# Patient Record
Sex: Female | Born: 1985 | Race: White | Hispanic: No | Marital: Married
Health system: Southern US, Community
[De-identification: ages and names within clinical notes are randomized; demographics above are authoritative.]

## PROBLEM LIST (undated history)

## (undated) DIAGNOSIS — J45909 Unspecified asthma, uncomplicated: Secondary | ICD-10-CM

## (undated) DIAGNOSIS — F410 Panic disorder [episodic paroxysmal anxiety] without agoraphobia: Secondary | ICD-10-CM

## (undated) DIAGNOSIS — F411 Generalized anxiety disorder: Secondary | ICD-10-CM

## (undated) DIAGNOSIS — T7840XA Allergy, unspecified, initial encounter: Secondary | ICD-10-CM

## (undated) DIAGNOSIS — L509 Urticaria, unspecified: Secondary | ICD-10-CM

## (undated) HISTORY — DX: Urticaria, unspecified: L50.9

## (undated) HISTORY — PX: WISDOM TOOTH EXTRACTION: SHX21

## (undated) HISTORY — DX: Generalized anxiety disorder: F41.1

## (undated) HISTORY — DX: Unspecified asthma, uncomplicated: J45.909

## (undated) HISTORY — PX: TONSILLECTOMY: SUR1361

## (undated) HISTORY — DX: Allergy, unspecified, initial encounter: T78.40XA

## (undated) HISTORY — DX: Panic disorder (episodic paroxysmal anxiety): F41.0

---
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
## 2006-01-15 ENCOUNTER — Ambulatory Visit: Payer: Self-pay | Admitting: Family Medicine

## 2006-01-29 ENCOUNTER — Ambulatory Visit: Payer: Self-pay | Admitting: Family Medicine

## 2006-01-29 ENCOUNTER — Encounter (INDEPENDENT_AMBULATORY_CARE_PROVIDER_SITE_OTHER): Payer: Self-pay | Admitting: Internal Medicine

## 2006-01-29 ENCOUNTER — Other Ambulatory Visit: Admission: RE | Admit: 2006-01-29 | Discharge: 2006-01-29 | Payer: Self-pay | Admitting: Family Medicine

## 2006-03-06 ENCOUNTER — Ambulatory Visit: Payer: Self-pay | Admitting: Family Medicine

## 2006-03-20 ENCOUNTER — Ambulatory Visit: Payer: Self-pay | Admitting: Internal Medicine

## 2006-09-03 ENCOUNTER — Ambulatory Visit: Payer: Self-pay | Admitting: Family Medicine

## 2006-12-22 ENCOUNTER — Telehealth (INDEPENDENT_AMBULATORY_CARE_PROVIDER_SITE_OTHER): Payer: Self-pay | Admitting: *Deleted

## 2007-01-15 ENCOUNTER — Encounter (INDEPENDENT_AMBULATORY_CARE_PROVIDER_SITE_OTHER): Payer: Self-pay | Admitting: Internal Medicine

## 2007-01-28 ENCOUNTER — Observation Stay (HOSPITAL_COMMUNITY): Admission: EM | Admit: 2007-01-28 | Discharge: 2007-01-29 | Payer: Self-pay | Admitting: Emergency Medicine

## 2007-02-05 ENCOUNTER — Other Ambulatory Visit: Admission: RE | Admit: 2007-02-05 | Discharge: 2007-02-05 | Payer: Self-pay | Admitting: Internal Medicine

## 2007-02-05 ENCOUNTER — Ambulatory Visit: Payer: Self-pay | Admitting: Internal Medicine

## 2007-02-05 ENCOUNTER — Encounter (INDEPENDENT_AMBULATORY_CARE_PROVIDER_SITE_OTHER): Payer: Self-pay | Admitting: Internal Medicine

## 2007-02-09 LAB — CONVERTED CEMR LAB
BUN: 7 mg/dL (ref 6–23)
Basophils Absolute: 0.1 10*3/uL (ref 0.0–0.1)
Chloride: 107 meq/L (ref 96–112)
Cholesterol: 156 mg/dL (ref 0–200)
Creatinine, Ser: 0.7 mg/dL (ref 0.4–1.2)
Eosinophils Absolute: 0.2 10*3/uL (ref 0.0–0.6)
GFR calc non Af Amer: 113 mL/min
HCT: 38.3 % (ref 36.0–46.0)
MCHC: 34.5 g/dL (ref 30.0–36.0)
MCV: 82.8 fL (ref 78.0–100.0)
Monocytes Absolute: 0.2 10*3/uL (ref 0.2–0.7)
Monocytes Relative: 2.3 % — ABNORMAL LOW (ref 3.0–11.0)
Neutrophils Relative %: 79.3 % — ABNORMAL HIGH (ref 43.0–77.0)
Potassium: 4 meq/L (ref 3.5–5.1)
RBC: 4.62 M/uL (ref 3.87–5.11)
RDW: 12.2 % (ref 11.5–14.6)
Sodium: 141 meq/L (ref 135–145)
TSH: 0.95 microintl units/mL (ref 0.35–5.50)

## 2007-02-18 ENCOUNTER — Encounter (INDEPENDENT_AMBULATORY_CARE_PROVIDER_SITE_OTHER): Payer: Self-pay | Admitting: *Deleted

## 2007-03-02 ENCOUNTER — Encounter (INDEPENDENT_AMBULATORY_CARE_PROVIDER_SITE_OTHER): Payer: Self-pay | Admitting: Internal Medicine

## 2007-03-11 ENCOUNTER — Ambulatory Visit: Payer: Self-pay | Admitting: Family Medicine

## 2007-03-11 DIAGNOSIS — F411 Generalized anxiety disorder: Secondary | ICD-10-CM

## 2007-03-11 DIAGNOSIS — F410 Panic disorder [episodic paroxysmal anxiety] without agoraphobia: Secondary | ICD-10-CM | POA: Insufficient documentation

## 2007-03-24 ENCOUNTER — Ambulatory Visit: Payer: Self-pay | Admitting: Professional

## 2007-03-31 ENCOUNTER — Ambulatory Visit: Payer: Self-pay | Admitting: Professional

## 2007-04-02 ENCOUNTER — Ambulatory Visit: Payer: Self-pay | Admitting: Family Medicine

## 2007-05-07 ENCOUNTER — Telehealth: Payer: Self-pay | Admitting: Family Medicine

## 2007-07-01 ENCOUNTER — Encounter (INDEPENDENT_AMBULATORY_CARE_PROVIDER_SITE_OTHER): Payer: Self-pay | Admitting: *Deleted

## 2007-08-03 ENCOUNTER — Ambulatory Visit: Payer: Self-pay | Admitting: Family Medicine

## 2007-08-18 ENCOUNTER — Ambulatory Visit: Payer: Self-pay | Admitting: Family Medicine

## 2007-08-18 DIAGNOSIS — J45909 Unspecified asthma, uncomplicated: Secondary | ICD-10-CM | POA: Insufficient documentation

## 2007-08-18 DIAGNOSIS — R062 Wheezing: Secondary | ICD-10-CM

## 2007-08-18 DIAGNOSIS — J069 Acute upper respiratory infection, unspecified: Secondary | ICD-10-CM | POA: Insufficient documentation

## 2007-08-21 ENCOUNTER — Telehealth (INDEPENDENT_AMBULATORY_CARE_PROVIDER_SITE_OTHER): Payer: Self-pay | Admitting: Internal Medicine

## 2007-08-21 ENCOUNTER — Ambulatory Visit: Payer: Self-pay | Admitting: Family Medicine

## 2007-08-21 ENCOUNTER — Encounter (INDEPENDENT_AMBULATORY_CARE_PROVIDER_SITE_OTHER): Payer: Self-pay | Admitting: Internal Medicine

## 2007-08-21 DIAGNOSIS — B999 Unspecified infectious disease: Secondary | ICD-10-CM | POA: Insufficient documentation

## 2007-08-21 DIAGNOSIS — B9789 Other viral agents as the cause of diseases classified elsewhere: Secondary | ICD-10-CM

## 2007-08-28 LAB — CONVERTED CEMR LAB
Basophils Absolute: 0 10*3/uL (ref 0.0–0.1)
Basophils Relative: 0 % (ref 0–1)
GGT: 96 units/L — ABNORMAL HIGH (ref 7–51)
Hemoglobin: 13.3 g/dL (ref 12.0–15.0)
Lymphocytes Relative: 17 % (ref 12–46)
MCHC: 32.7 g/dL (ref 30.0–36.0)
Mono Screen: NEGATIVE
Neutro Abs: 5.3 10*3/uL (ref 1.7–7.7)
Neutrophils Relative %: 73 % (ref 43–77)
Platelets: 269 10*3/uL (ref 150–400)
RDW: 13.5 % (ref 11.5–15.5)

## 2007-12-21 ENCOUNTER — Telehealth (INDEPENDENT_AMBULATORY_CARE_PROVIDER_SITE_OTHER): Payer: Self-pay | Admitting: Internal Medicine

## 2008-02-26 ENCOUNTER — Ambulatory Visit: Payer: Self-pay | Admitting: Family Medicine

## 2008-02-26 ENCOUNTER — Encounter (INDEPENDENT_AMBULATORY_CARE_PROVIDER_SITE_OTHER): Payer: Self-pay | Admitting: Internal Medicine

## 2008-02-26 ENCOUNTER — Other Ambulatory Visit: Admission: RE | Admit: 2008-02-26 | Discharge: 2008-02-26 | Payer: Self-pay | Admitting: Family Medicine

## 2008-02-26 LAB — CONVERTED CEMR LAB: Pap Smear: NORMAL

## 2008-03-02 ENCOUNTER — Encounter (INDEPENDENT_AMBULATORY_CARE_PROVIDER_SITE_OTHER): Payer: Self-pay | Admitting: Internal Medicine

## 2008-03-02 ENCOUNTER — Encounter (INDEPENDENT_AMBULATORY_CARE_PROVIDER_SITE_OTHER): Payer: Self-pay | Admitting: *Deleted

## 2008-04-11 ENCOUNTER — Telehealth: Payer: Self-pay | Admitting: Family Medicine

## 2008-05-17 ENCOUNTER — Telehealth (INDEPENDENT_AMBULATORY_CARE_PROVIDER_SITE_OTHER): Payer: Self-pay | Admitting: Internal Medicine

## 2008-09-09 ENCOUNTER — Telehealth (INDEPENDENT_AMBULATORY_CARE_PROVIDER_SITE_OTHER): Payer: Self-pay | Admitting: Internal Medicine

## 2008-11-18 ENCOUNTER — Encounter (INDEPENDENT_AMBULATORY_CARE_PROVIDER_SITE_OTHER): Payer: Self-pay | Admitting: Internal Medicine

## 2008-11-18 ENCOUNTER — Ambulatory Visit: Payer: Self-pay | Admitting: Family Medicine

## 2008-11-23 ENCOUNTER — Encounter (INDEPENDENT_AMBULATORY_CARE_PROVIDER_SITE_OTHER): Payer: Self-pay | Admitting: Internal Medicine

## 2008-11-23 LAB — CONVERTED CEMR LAB

## 2008-12-12 ENCOUNTER — Emergency Department (HOSPITAL_COMMUNITY): Admission: EM | Admit: 2008-12-12 | Discharge: 2008-12-12 | Payer: Self-pay | Admitting: Emergency Medicine

## 2008-12-21 ENCOUNTER — Ambulatory Visit: Payer: Self-pay | Admitting: Family Medicine

## 2008-12-21 ENCOUNTER — Telehealth (INDEPENDENT_AMBULATORY_CARE_PROVIDER_SITE_OTHER): Payer: Self-pay

## 2008-12-21 DIAGNOSIS — J3089 Other allergic rhinitis: Secondary | ICD-10-CM | POA: Insufficient documentation

## 2008-12-21 DIAGNOSIS — J309 Allergic rhinitis, unspecified: Secondary | ICD-10-CM

## 2008-12-30 ENCOUNTER — Telehealth (INDEPENDENT_AMBULATORY_CARE_PROVIDER_SITE_OTHER): Payer: Self-pay | Admitting: Internal Medicine

## 2009-02-08 ENCOUNTER — Ambulatory Visit: Payer: Self-pay | Admitting: Family Medicine

## 2009-02-08 ENCOUNTER — Encounter (INDEPENDENT_AMBULATORY_CARE_PROVIDER_SITE_OTHER): Payer: Self-pay | Admitting: Internal Medicine

## 2009-02-08 DIAGNOSIS — R55 Syncope and collapse: Secondary | ICD-10-CM

## 2009-02-08 LAB — CONVERTED CEMR LAB: Beta hcg, urine, semiquantitative: NEGATIVE

## 2009-02-13 LAB — CONVERTED CEMR LAB
BUN: 14 mg/dL (ref 6–23)
Basophils Absolute: 0 10*3/uL (ref 0.0–0.1)
Basophils Relative: 0.1 % (ref 0.0–3.0)
CO2: 28 meq/L (ref 19–32)
Calcium: 8.8 mg/dL (ref 8.4–10.5)
Chloride: 107 meq/L (ref 96–112)
Creatinine, Ser: 0.7 mg/dL (ref 0.4–1.2)
Eosinophils Absolute: 1.6 10*3/uL — ABNORMAL HIGH (ref 0.0–0.7)
Glucose, Bld: 87 mg/dL (ref 70–99)
MCHC: 34 g/dL (ref 30.0–36.0)
MCV: 83.2 fL (ref 78.0–100.0)
Monocytes Absolute: 0.6 10*3/uL (ref 0.1–1.0)
Neutrophils Relative %: 56.3 % (ref 43.0–77.0)
RBC: 4.73 M/uL (ref 3.87–5.11)
RDW: 12.5 % (ref 11.5–14.6)

## 2009-02-14 ENCOUNTER — Encounter: Payer: Self-pay | Admitting: Family Medicine

## 2009-02-14 ENCOUNTER — Ambulatory Visit: Payer: Self-pay

## 2009-02-22 ENCOUNTER — Encounter (INDEPENDENT_AMBULATORY_CARE_PROVIDER_SITE_OTHER): Payer: Self-pay | Admitting: Internal Medicine

## 2009-03-01 ENCOUNTER — Encounter (INDEPENDENT_AMBULATORY_CARE_PROVIDER_SITE_OTHER): Payer: Self-pay | Admitting: Internal Medicine

## 2009-03-03 ENCOUNTER — Encounter (INDEPENDENT_AMBULATORY_CARE_PROVIDER_SITE_OTHER): Payer: Self-pay | Admitting: Internal Medicine

## 2009-03-21 ENCOUNTER — Encounter (INDEPENDENT_AMBULATORY_CARE_PROVIDER_SITE_OTHER): Payer: Self-pay | Admitting: Internal Medicine

## 2009-03-29 ENCOUNTER — Other Ambulatory Visit: Admission: RE | Admit: 2009-03-29 | Discharge: 2009-03-29 | Payer: Self-pay | Admitting: Family Medicine

## 2009-03-29 ENCOUNTER — Encounter (INDEPENDENT_AMBULATORY_CARE_PROVIDER_SITE_OTHER): Payer: Self-pay | Admitting: Internal Medicine

## 2009-03-29 ENCOUNTER — Ambulatory Visit: Payer: Self-pay | Admitting: Family Medicine

## 2009-04-11 ENCOUNTER — Encounter: Payer: Self-pay | Admitting: Family Medicine

## 2009-04-14 ENCOUNTER — Telehealth (INDEPENDENT_AMBULATORY_CARE_PROVIDER_SITE_OTHER): Payer: Self-pay | Admitting: Internal Medicine

## 2009-04-21 ENCOUNTER — Encounter: Payer: Self-pay | Admitting: Family Medicine

## 2009-04-27 ENCOUNTER — Ambulatory Visit: Payer: Self-pay | Admitting: Family Medicine

## 2009-04-27 DIAGNOSIS — R87619 Unspecified abnormal cytological findings in specimens from cervix uteri: Secondary | ICD-10-CM

## 2009-05-15 ENCOUNTER — Telehealth: Payer: Self-pay | Admitting: Family Medicine

## 2009-05-16 ENCOUNTER — Telehealth (INDEPENDENT_AMBULATORY_CARE_PROVIDER_SITE_OTHER): Payer: Self-pay

## 2009-06-20 ENCOUNTER — Telehealth (INDEPENDENT_AMBULATORY_CARE_PROVIDER_SITE_OTHER): Payer: Self-pay | Admitting: Internal Medicine

## 2009-08-08 ENCOUNTER — Telehealth: Payer: Self-pay | Admitting: Family Medicine

## 2010-05-09 ENCOUNTER — Ambulatory Visit: Payer: Self-pay | Admitting: Family Medicine

## 2010-05-09 ENCOUNTER — Other Ambulatory Visit: Admission: RE | Admit: 2010-05-09 | Discharge: 2010-05-09 | Payer: Self-pay | Admitting: Family Medicine

## 2010-05-14 LAB — CONVERTED CEMR LAB: Pap Smear: NEGATIVE

## 2010-07-26 NOTE — Assessment & Plan Note (Signed)
Summary: CPX W/PAP SMEAR / LFW   Vital Signs:  Patient profile:   25 year old female Height:      62 inches Weight:      127 pounds BMI:     23.31 Temp:     98.7 degrees F oral Pulse rate:   72 / minute Pulse rhythm:   regular BP sitting:   120 / 70  (left arm) Cuff size:   regular  Vitals Entered By: Linde Gillis CMA Duncan Dull) (May 09, 2010 8:54 AM) CC: complete physicial with pap   History of Present Illness: 25 yo here for CPX with pap.  G0, on Yaz for OCPs.  With same sexual partner. Had high risk HPV pap last year, sent to gyn.  Colpo negative. Has received Gardasil series.  No complaints. Weaned herself of Paxil a couple of months ago and is fine.  Feels like anxiety is under control.  Current Medications (verified): 1)  Yaz 3-0.02 Mg Tabs (Drospirenone-Ethinyl Estradiol) .... Use As Directed 2)  Singulair 10 Mg Tabs (Montelukast Sodium) .Marland Kitchen.. 1 Once Daily For Allergic Rhinitis 3)  Proair Hfa 108 (90 Base) Mcg/act Aers (Albuterol Sulfate) .Marland Kitchen.. 1-2 Puffs Every 4 Hrs As Needed Wheezing or Chest Tightness 4)  Tylenol 325 Mg Tabs (Acetaminophen) .... Otc As Directed. 5)  Sleep Aide .... Otc As Directed. 6)  Nasonex 50 Mcg/act Susp (Mometasone Furoate) .... 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 Resp:  Denies cough, sputum productive, and wheezing. GU:  Denies discharge, dysuria, urinary frequency, and urinary hesitancy. Derm:  Denies rash. Neuro:  Denies headaches. Psych:  Denies anxiety and depression.  Physical Exam  General:  alert, well-developed, well-nourished, and well-hydrated.   Head:  normocephalic, atraumatic, and no abnormalities observed.   Eyes:  vision grossly intact, pupils equal, and pupils round.   Ears:  R ear normal and L ear normal.   Nose:  mucosal edema, boggy, .  no airflow obstruction  sinuses neg Mouth:  good dentition, pharynx pink and moist, and no exudates.   Neck:  no masses, no thyromegaly, no JVD, and no carotid bruits.   Breasts:  No mass, nodules, thickening, tenderness, bulging, retraction, inflamation, nipple discharge or skin changes noted.   Lungs:  normal respiratory effort, no intercostal retractions, no accessory muscle use, and normal breath sounds.   Heart:  normal rate, regular rhythm, and no murmur.   Abdomen:  soft, non-tender, normal bowel sounds, no distention, no masses, no rebound tenderness, no abdominal hernia, no inguinal hernia, no hepatomegaly, and no splenomegaly.   Rectal:  no external abnormalities.   Genitalia:  Pelvic Exam:        External: normal female genitalia without lesions or masses  Vagina: normal without lesions or masses        Cervix: normal without lesions or masses        Adnexa: normal bimanual exam without masses or fullness        Uterus: normal by palpation        Pap smear: performed Msk:  no joint swelling, no joint warmth, no redness over joints, and no joint deformities.   Extremities:  no edema Neurologic:  alert & oriented X3 and gait normal.   Skin:  turgor normal, color normal, and no rashes.    Psych:  normally interactive and not anxious appearing.     Impression & Recommendations:  Problem # 1:  WELL ADULT (ICD-V70.0) Reviewed preventive care protocols, scheduled due services, and updated immunizations Discussed nutrition, exercise, diet, and healthy lifestyle.  Also discussed sexual activity, pregnancy risk, and STD risk.  Encouraged to get regular exercise.  Pap today with GC/Chlamydia screening. Refused other STD testing.  Complete Medication List: 1)  Yaz 3-0.02 Mg Tabs (Drospirenone-ethinyl estradiol) .... Use as directed 2)  Singulair 10 Mg Tabs (Montelukast sodium) .Marland Kitchen.. 1 once daily for allergic rhinitis 3)  Proair Hfa 108 (90 Base) Mcg/act Aers (Albuterol sulfate) .Marland Kitchen.. 1-2 puffs every 4 hrs as needed wheezing or chest tightness 4)  Tylenol 325 Mg Tabs (Acetaminophen) .... Otc as directed. 5)  Sleep Aide  .... Otc as directed. 6)  Nasonex 50 Mcg/act Susp (Mometasone furoate) .... 2 sprays each nostri daily for congestion Prescriptions: YAZ 3-0.02 MG TABS (DROSPIRENONE-ETHINYL ESTRADIOL) use as directed  #1 x 11   Entered and Authorized by:   Ruthe Mannan MD   Signed by:   Ruthe Mannan MD on 05/09/2010   Method used:   Electronically to        [facility]* (retail)       [address]       [place], [place]  [id number]       Ph: [phone]       Fax: [phone]   RxID:   [id number]    Orders Added: 1)  Est. Patient 18-39 years [24401]    Current Allergies (reviewed today): No known allergies

## 2010-07-26 NOTE — Progress Notes (Signed)
Summary: Flovent HFA 110 mcg refill  Phone Note From Pharmacy Call back at [phone]   Caller: [facility] [address] Call For: Dr Ermalene Searing  Summary of Call: [facility] [address] refill request electonically for Flovent HFA to inhale 2 puffs twice daily as needed. #12gm. Not on med list. Please advise.  Initial call taken by: Lewanda Rife LPN,  August 08, 2009 10:03 AM  Follow-up for Phone Call        Please call pt to find out more of why this was stopped..I just see it removed from med list..no reason stated....has she restarted?  On this for asthma?.The patient is here for annual wellness exam and preventative care.    controlled or not? Follow-up by: Kerby Nora MD,  August 08, 2009 5:47 PM  Additional Follow-up for Phone Call Additional follow up Details #1::        uses rescue inhaler as needed   physical was in 2010 august   Additional Follow-up by: Benny Lennert CMA Duncan Dull),  August 09, 2009 8:55 AM    Additional Follow-up for Phone Call Additional follow up Details #2::    I don't understand..flovent HFA is controlled not rescue. So are you saying she does not need this refilled and she is only using albuterol rescue as needed?  Follow-up by: Kerby Nora MD,  August 09, 2009 2:04 PM  Additional Follow-up for Phone Call Additional follow up Details #3:: Details for Additional Follow-up Action Taken: yes  Additional Follow-up by: Benny Lennert CMA Duncan Dull),  August 09, 2009 2:14 PM

## 2011-01-10 ENCOUNTER — Encounter: Payer: Self-pay | Admitting: *Deleted

## 2011-03-29 ENCOUNTER — Other Ambulatory Visit: Payer: Self-pay | Admitting: *Deleted

## 2011-03-29 MED ORDER — DROSPIRENONE-ETHINYL ESTRADIOL 3-0.02 MG PO TABS: 1.0000 | ORAL_TABLET | ORAL | Status: DC

## 2011-04-08 LAB — POCT I-STAT CREATININE: Creatinine, Ser: 0.7

## 2011-04-08 LAB — DIFFERENTIAL
Eosinophils Absolute: 0.1
Eosinophils Relative: 1
Lymphocytes Relative: 14
Lymphs Abs: 2
Monocytes Relative: 5
Neutrophils Relative %: 78 — ABNORMAL HIGH

## 2011-04-08 LAB — CBC
HCT: 35.9 — ABNORMAL LOW
MCV: 80.6
RBC: 4.45
WBC: 14.7 — ABNORMAL HIGH

## 2011-04-08 LAB — I-STAT 8, (EC8 V) (CONVERTED LAB)
Acid-base deficit: 1
BUN: 8
Chloride: 103
HCT: 40
Potassium: 3.4 — ABNORMAL LOW
pH, Ven: 7.404 — ABNORMAL HIGH

## 2011-05-09 ENCOUNTER — Encounter: Payer: Self-pay | Admitting: Family Medicine

## 2011-05-13 ENCOUNTER — Ambulatory Visit (INDEPENDENT_AMBULATORY_CARE_PROVIDER_SITE_OTHER): Payer: BC Managed Care – PPO | Admitting: Family Medicine

## 2011-05-13 ENCOUNTER — Encounter: Payer: Self-pay | Admitting: Family Medicine

## 2011-05-13 ENCOUNTER — Other Ambulatory Visit (HOSPITAL_COMMUNITY)
Admission: RE | Admit: 2011-05-13 | Discharge: 2011-05-13 | Disposition: A | Discharge status: Home / Self Care | Payer: BC Managed Care – PPO | Source: Ambulatory Visit | Attending: Family Medicine | Admitting: Family Medicine

## 2011-05-13 ENCOUNTER — Encounter: Payer: Self-pay | Admitting: *Deleted

## 2011-05-13 VITALS — BP 110/80 | HR 77 | Temp 98.9°F | Ht 62.0 in | Wt 124.5 lb

## 2011-05-13 DIAGNOSIS — Z0000 Encounter for general adult medical examination without abnormal findings: Secondary | ICD-10-CM

## 2011-05-13 DIAGNOSIS — R87810 Cervical high risk human papillomavirus (HPV) DNA test positive: Secondary | ICD-10-CM | POA: Insufficient documentation

## 2011-05-13 DIAGNOSIS — Z113 Encounter for screening for infections with a predominantly sexual mode of transmission: Secondary | ICD-10-CM | POA: Insufficient documentation

## 2011-05-13 DIAGNOSIS — Z309 Encounter for contraceptive management, unspecified: Secondary | ICD-10-CM

## 2011-05-13 DIAGNOSIS — Z136 Encounter for screening for cardiovascular disorders: Secondary | ICD-10-CM

## 2011-05-13 DIAGNOSIS — Z01419 Encounter for gynecological examination (general) (routine) without abnormal findings: Secondary | ICD-10-CM | POA: Insufficient documentation

## 2011-05-13 DIAGNOSIS — F411 Generalized anxiety disorder: Secondary | ICD-10-CM

## 2011-05-13 DIAGNOSIS — R87619 Unspecified abnormal cytological findings in specimens from cervix uteri: Secondary | ICD-10-CM

## 2011-05-13 LAB — BASIC METABOLIC PANEL
CO2: 24 mEq/L (ref 19–32)
Calcium: 8.2 mg/dL — ABNORMAL LOW (ref 8.4–10.5)
GFR: 103.77 mL/min (ref >60.00)
Sodium: 137 mEq/L (ref 135–145)

## 2011-05-13 LAB — LIPID PANEL
HDL: 64 mg/dL (ref >39.00)
Triglycerides: 141 mg/dL (ref 0.0–149.0)

## 2011-05-13 MED ORDER — NORGESTIMATE-ETH ESTRADIOL 0.25-35 MG-MCG PO TABS: 1.0000 | ORAL_TABLET | Freq: Every day | ORAL | Status: DC

## 2011-05-13 NOTE — Patient Instructions (Signed)
Good to see you. Please call me from your pharmacy once you find out what they carry. Have a wonderful Thanksgiving.

## 2011-05-13 NOTE — Progress Notes (Signed)
25 yo here for CPX with pap.  G0, on Yaz for OCPs. With same sexual partner.  Wants to try new OCP, having some break through bleeding.  Had high risk HPV pap two years ago, sent to gyn. Colpo negative.  Has received Gardasil series.  No complaints.    Patient Active Problem List  Diagnoses  . VIRAL INFECTION  . ANXIETY DISORDER  . PANIC ATTACK  . URI  . ALLERGIC RHINITIS  . ASTHMA, ACUTE  . SYNCOPE  . WHEEZING  . PAP SMEAR, ABNORMAL  . Routine general medical examination at a health care facility   Past Medical History  Diagnosis Date  . Panic disorder without agoraphobia   . Anxiety state, unspecified    Past Surgical History  Procedure Date  . Tonsillectomy     01/26/07- Dr. Jenne Pane   History  Substance Use Topics  . Smoking status: Not on file  . Smokeless tobacco: Not on file  . Alcohol Use:    Family History  Problem Relation Age of Onset  . Hypertension Father   . Depression Mother   . Diabetes    . Cancer    . Stroke     Allergies not on file Current Outpatient Prescriptions on File Prior to Visit  Medication Sig Dispense Refill  . acetaminophen (TYLENOL) 325 MG tablet Take 325 mg by mouth as directed.        Marland Kitchen albuterol (PROAIR HFA) 108 (90 BASE) MCG/ACT inhaler Inhale 2 puffs into the lungs every 4 (four) hours as needed.        . drospirenone-ethinyl estradiol (YAZ,GIANVI,LORYNA) 3-0.02 MG tablet Take 1 tablet by mouth as directed.  1 Package  1  . mometasone (NASONEX) 50 MCG/ACT nasal spray Place 2 sprays into the nose daily.        . montelukast (SINGULAIR) 10 MG tablet Take 10 mg by mouth at bedtime.        . NON FORMULARY SLEEP AIDE OTC as directed        The PMH, PSH, Social History, Family History, Medications, and allergies have been reviewed in [facility], and have been updated if relevant.   Review of Systems  See HPI  General: Denies malaise.  Resp: Denies cough, sputum productive, and wheezing.  GU: Denies discharge, dysuria, urinary  frequency, and urinary hesitancy.  Derm: Denies rash.  Neuro: Denies headaches.  Psych: Denies anxiety and depression.   Physical Exam  BP 110/80  Pulse 77  Temp(Src) 98.9 F (37.2 C) (Oral)  Ht 5\' 2"  (1.575 m)  Wt 124 lb 8 oz (56.473 kg)  BMI 22.77 kg/m2  LMP 04/05/2011   General:  Well-developed,well-nourished,in no acute distress; alert,appropriate and cooperative throughout examination Head:  normocephalic and atraumatic.   Eyes:  vision grossly intact, pupils equal, pupils round, and pupils reactive to light.   Ears:  R ear normal and L ear normal.   Nose:  no external deformity.   Mouth:  good dentition.   Neck:  No deformities, masses, or tenderness noted. Breasts:  No mass, nodules, thickening, tenderness, bulging, retraction, inflamation, nipple discharge or skin changes noted.   Lungs:  Normal respiratory effort, chest expands symmetrically. Lungs are clear to auscultation, no crackles or wheezes. Heart:  Normal rate and regular rhythm. S1 and S2 normal without gallop, murmur, click, rub or other extra sounds. Abdomen:  Bowel sounds positive,abdomen soft and non-tender without masses, organomegaly or hernias noted. Rectal:  no external abnormalities.   Genitalia:  Pelvic Exam:  External: normal female genitalia without lesions or masses        Vagina: normal without lesions or masses        Cervix: normal without lesions or masses        Adnexa: normal bimanual exam without masses or fullness        Uterus: normal by palpation        Pap smear: performed Msk:  No deformity or scoliosis noted of thoracic or lumbar spine.   Extremities:  No clubbing, cyanosis, edema, or deformity noted with normal full range of motion of all joints.   Neurologic:  alert & oriented X3 and gait normal.   Skin:  Intact without suspicious lesions or rashes Cervical Nodes:  No lymphadenopathy noted Axillary Nodes:  No palpable lymphadenopathy Psych:  Cognition and judgment appear  intact. Alert and cooperative with normal attention span and concentration. No apparent delusions, illusions, hallucinations  Assessment and Plan: 1. Routine general medical examination at a health care facility   Reviewed preventive care protocols, scheduled due services, and updated immunizations Discussed nutrition, exercise, diet, and healthy lifestyle.  Cytology -Pap Smear, Basic Metabolic Panel (BMET), lipid panel  2. Contraception management   D/c yaz, will try spintec.

## 2011-07-18 ENCOUNTER — Other Ambulatory Visit: Payer: Self-pay | Admitting: *Deleted

## 2011-07-18 MED ORDER — MONTELUKAST SODIUM 10 MG PO TABS: 10.0000 mg | ORAL_TABLET | Freq: Every day | ORAL | Status: DC

## 2011-08-02 ENCOUNTER — Ambulatory Visit (INDEPENDENT_AMBULATORY_CARE_PROVIDER_SITE_OTHER)
Admission: RE | Admit: 2011-08-02 | Discharge: 2011-08-02 | Disposition: A | Discharge status: Home / Self Care | Payer: BC Managed Care – PPO | Source: Ambulatory Visit | Attending: Family Medicine | Admitting: Family Medicine

## 2011-08-02 ENCOUNTER — Ambulatory Visit: Payer: BC Managed Care – PPO | Admitting: Family Medicine

## 2011-08-02 ENCOUNTER — Ambulatory Visit (INDEPENDENT_AMBULATORY_CARE_PROVIDER_SITE_OTHER): Payer: BC Managed Care – PPO | Admitting: Family Medicine

## 2011-08-02 ENCOUNTER — Encounter: Payer: Self-pay | Admitting: Family Medicine

## 2011-08-02 VITALS — BP 120/70 | HR 68 | Temp 98.2°F | Wt 123.8 lb

## 2011-08-02 DIAGNOSIS — M25562 Pain in left knee: Secondary | ICD-10-CM

## 2011-08-02 DIAGNOSIS — M25569 Pain in unspecified knee: Secondary | ICD-10-CM

## 2011-08-02 NOTE — Patient Instructions (Signed)
Good to see you. You can take Alleve as directed on bottle and try doing these exercises after work. I will call you with your xray results. Have a great weekend.

## 2011-08-02 NOTE — Progress Notes (Signed)
SUBJECTIVE: Mariah [name] is a 26 y.o. female with 6 months of progressive knee pain. symptoms have been worsening since that time. Prior history of related problems: no prior problems with this area in the past.  Stands all day, feels pain is behind knee cap. Worsened by walking and going down stairs. No known injury, no swelling. Sometimes feels like her knee is popping. Knee has never "given out on her."  OBJECTIVE: BP 120/70  Pulse 68  Temp(Src) 98.2 F (36.8 C) (Oral)  Wt 123 lb 12 oz (56.133 kg)  Vital signs as noted above. Appearance: alert, well appearing, and in no distress. Knee exam: normal exam, no swelling, tenderness, instability; ligaments intact, FROM, negative drawer sign, negative McMurray sign. X-ray: ordered, but results not yet available.  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 Given sports medicine advisor with exercises to strengthen quad muscles. See pt instructions for details.

## 2011-08-14 ENCOUNTER — Other Ambulatory Visit: Payer: Self-pay

## 2011-08-14 MED ORDER — ALBUTEROL SULFATE HFA 108 (90 BASE) MCG/ACT IN AERS: 2.0000 | INHALATION_SPRAY | RESPIRATORY_TRACT | Status: DC | PRN

## 2011-08-14 NOTE — Telephone Encounter (Signed)
Received fax refill request from pharmacy.  Prescription refilled.

## 2011-12-16 ENCOUNTER — Emergency Department (HOSPITAL_BASED_OUTPATIENT_CLINIC_OR_DEPARTMENT_OTHER)
Admission: EM | Admit: 2011-12-16 | Discharge: 2011-12-16 | Disposition: A | Discharge status: Home / Self Care | Payer: BC Managed Care – PPO | Attending: Emergency Medicine | Admitting: Emergency Medicine

## 2011-12-16 ENCOUNTER — Encounter (HOSPITAL_BASED_OUTPATIENT_CLINIC_OR_DEPARTMENT_OTHER): Payer: Self-pay | Admitting: *Deleted

## 2011-12-16 DIAGNOSIS — F410 Panic disorder [episodic paroxysmal anxiety] without agoraphobia: Secondary | ICD-10-CM | POA: Insufficient documentation

## 2011-12-16 DIAGNOSIS — Z809 Family history of malignant neoplasm, unspecified: Secondary | ICD-10-CM | POA: Insufficient documentation

## 2011-12-16 DIAGNOSIS — R55 Syncope and collapse: Secondary | ICD-10-CM | POA: Insufficient documentation

## 2011-12-16 DIAGNOSIS — N390 Urinary tract infection, site not specified: Secondary | ICD-10-CM | POA: Insufficient documentation

## 2011-12-16 DIAGNOSIS — Z818 Family history of other mental and behavioral disorders: Secondary | ICD-10-CM | POA: Insufficient documentation

## 2011-12-16 DIAGNOSIS — Z833 Family history of diabetes mellitus: Secondary | ICD-10-CM | POA: Insufficient documentation

## 2011-12-16 DIAGNOSIS — R319 Hematuria, unspecified: Secondary | ICD-10-CM | POA: Insufficient documentation

## 2011-12-16 DIAGNOSIS — Z8249 Family history of ischemic heart disease and other diseases of the circulatory system: Secondary | ICD-10-CM | POA: Insufficient documentation

## 2011-12-16 DIAGNOSIS — Z885 Allergy status to narcotic agent status: Secondary | ICD-10-CM | POA: Insufficient documentation

## 2011-12-16 DIAGNOSIS — Z823 Family history of stroke: Secondary | ICD-10-CM | POA: Insufficient documentation

## 2011-12-16 DIAGNOSIS — Z87891 Personal history of nicotine dependence: Secondary | ICD-10-CM | POA: Insufficient documentation

## 2011-12-16 LAB — URINALYSIS, ROUTINE W REFLEX MICROSCOPIC
Glucose, UA: NEGATIVE mg/dL
Ketones, ur: 40 mg/dL — AB
pH: 6 (ref 5.0–8.0)

## 2011-12-16 LAB — URINE MICROSCOPIC-ADD ON

## 2011-12-16 MED ORDER — MORPHINE SULFATE 4 MG/ML IJ SOLN
4.0000 mg | Freq: Once | INTRAMUSCULAR | Status: AC
  Administered 2011-12-16: 4 mg via INTRAVENOUS
  Filled 2011-12-16: qty 1

## 2011-12-16 MED ORDER — PHENAZOPYRIDINE HCL 100 MG PO TABS
200.0000 mg | ORAL_TABLET | Freq: Once | ORAL | Status: AC
  Administered 2011-12-16: 200 mg via ORAL
  Filled 2011-12-16: qty 2

## 2011-12-16 MED ORDER — METOCLOPRAMIDE HCL 10 MG PO TABS: 10.0000 mg | ORAL_TABLET | Freq: Four times a day (QID) | ORAL | Status: DC | PRN

## 2011-12-16 MED ORDER — CEPHALEXIN 500 MG PO CAPS: 500.0000 mg | ORAL_CAPSULE | Freq: Four times a day (QID) | ORAL | Status: AC

## 2011-12-16 MED ORDER — TRAMADOL HCL 50 MG PO TABS: 50.0000 mg | ORAL_TABLET | Freq: Four times a day (QID) | ORAL | Status: AC | PRN

## 2011-12-16 MED ORDER — ONDANSETRON HCL 4 MG/2ML IJ SOLN
4.0000 mg | Freq: Once | INTRAMUSCULAR | Status: AC
  Administered 2011-12-16: 4 mg via INTRAVENOUS
  Filled 2011-12-16: qty 2

## 2011-12-16 MED ORDER — PHENAZOPYRIDINE HCL 200 MG PO TABS: 200.0000 mg | ORAL_TABLET | Freq: Three times a day (TID) | ORAL | Status: AC

## 2011-12-16 MED ORDER — DEXTROSE 5 % IV SOLN
1.0000 g | INTRAVENOUS | Status: DC
  Administered 2011-12-16: 1 g via INTRAVENOUS
  Filled 2011-12-16: qty 10

## 2011-12-16 MED ORDER — SODIUM CHLORIDE 0.9 % IV BOLUS (SEPSIS)
1000.0000 mL | Freq: Once | INTRAVENOUS | Status: AC
  Administered 2011-12-16: 1000 mL via INTRAVENOUS

## 2011-12-16 NOTE — ED Provider Notes (Signed)
History   This chart was scribed for Dione Booze, MD by Melba Coon. The patient was seen in room MH10/MH10 and the patient's care was started at 10:08PM.    CSN: [id number]  Arrival date & time 12/16/11  2123   First MD Initiated Contact with Patient 12/16/11 2207      Chief Complaint  Patient presents with  . Loss of Consciousness    (Consider location/radiation/quality/duration/timing/severity/associated sxs/prior treatment) HPI Mariah [name] is a 26 y.o. female who EMS presents to the Emergency Department complaining of constant, moderate to severe hematuria and dysuria with associated LOC with an onset this evening. Pt was at work when present s/s occurred. Pt describes the pain as razor blades. At work, she kept feeling like she had to urinate and having pelvic pain. When she went to check her tampon, it was non-bloody. Pt then began to feel nauseous with sweats and chills; soon after, pt exhibited feeling of LOC; she sat down then passed out. No prior Hx of kidney/urinary infections. Sitting and urination aggravate the pain. Pt feels better at time of exam. Pt also states that she had replacement of IUD today. No HA, fever, neck pain, sore throat, rash, back pain, CP, SOB, emesis, diarrhea, or extremity pain, edema, weakness, numbness, or tingling. Allergic to codeine and vicodin. No other pertinent medical symptoms. Non-smoker; occasional drinker.  Past Medical History  Diagnosis Date  . Panic disorder without agoraphobia   . Anxiety state, unspecified     Past Surgical History  Procedure Date  . Tonsillectomy     01/26/07- Dr. Jenne Pane    Family History  Problem Relation Age of Onset  . Hypertension Father   . Depression Mother   . Diabetes    . Cancer    . Stroke      History  Substance Use Topics  . Smoking status: Former Games developer  . Smokeless tobacco: Not on file  . Alcohol Use: Yes    OB History    Grav Para Term Preterm Abortions TAB SAB Ect Mult Living                 Review of Systems 10 Systems reviewed and all are negative for acute change except as noted in the HPI.   Allergies  Codeine and Vicodin  Home Medications   Current Outpatient Rx  Name Route Sig Dispense Refill  . ALBUTEROL SULFATE HFA 108 (90 BASE) MCG/ACT IN AERS Inhalation Inhale 1-2 puffs into the lungs every 6 (six) hours as needed. For wheezing or chest tightness    . FLUTICASONE PROPIONATE 50 MCG/ACT NA SUSP Nasal Place 1 spray into the nose daily.    . IBUPROFEN 200 MG PO TABS Oral Take 400 mg by mouth every 6 (six) hours as needed. For pain    . LEVONORGESTREL 20 MCG/24HR IU IUD Intrauterine 1 each by Intrauterine route once. Inserted May 2013    . MELATONIN 1 MG PO TABS Oral Take 1 tablet by mouth at bedtime as needed. For sleep    . MONTELUKAST SODIUM 10 MG PO TABS Oral Take 1 tablet (10 mg total) by mouth at bedtime. 30 tablet 11    BP 126/75  Pulse 94  Temp 97.6 F (36.4 C) (Oral)  Resp 16  SpO2 100%  LMP 11/15/2011  Physical Exam  Nursing note and vitals reviewed. Constitutional: She is oriented to person, place, and time. She appears well-developed and well-nourished. No distress.  HENT:  Head: Normocephalic and atraumatic.  Right Ear: External ear normal.  Left Ear: External ear normal.  Eyes: EOM are normal.  Neck: Normal range of motion. No tracheal deviation present.  Cardiovascular: Normal rate, regular rhythm and normal heart sounds.   No murmur heard. Pulmonary/Chest: Effort normal and breath sounds normal. No respiratory distress. She has no wheezes.  Abdominal: Soft. Bowel sounds are normal. There is tenderness (Mild suprapubic tenderness). There is no rebound and no guarding.  Musculoskeletal: Normal range of motion. She exhibits no edema and no tenderness.  Neurological: She is alert and oriented to person, place, and time.  Skin: Skin is warm and dry. No rash noted.  Psychiatric: She has a normal mood and affect. Her behavior is  normal.    ED Course  Procedures (including critical care time)   COORDINATION OF CARE:  10:10PM - EDMD reviews UA results which are c/w UTI. EDMD will order pyridium, Zofran, morphine, and Rocephin for the pt.   Results for orders placed during the hospital encounter of 12/16/11  URINALYSIS, ROUTINE W REFLEX MICROSCOPIC      Component Value Range   Color, Urine AMBER (*) YELLOW   APPearance TURBID (*) CLEAR   Specific Gravity, Urine 1.027  1.005 - 1.030   pH 6.0  5.0 - 8.0   Glucose, UA NEGATIVE  NEGATIVE mg/dL   Hgb urine dipstick LARGE (*) NEGATIVE   Bilirubin Urine SMALL (*) NEGATIVE   Ketones, ur 40 (*) NEGATIVE mg/dL   Protein, ur 161 (*) NEGATIVE mg/dL   Urobilinogen, UA 1.0  0.0 - 1.0 mg/dL   Nitrite POSITIVE (*) NEGATIVE   Leukocytes, UA LARGE (*) NEGATIVE  PREGNANCY, URINE      Component Value Range   Preg Test, Ur NEGATIVE  NEGATIVE  URINE MICROSCOPIC-ADD ON      Component Value Range   Squamous Epithelial / LPF FEW (*) RARE   WBC, UA TOO NUMEROUS TO COUNT  <3 WBC/hpf   RBC / HPF 11-20  <3 RBC/hpf   Bacteria, UA FEW (*) RARE      1. Urinary tract infection   2. Vasovagal syncope       MDM  Symptoms typical of UTI with probable hemorrhagic cystitis. Syncopal episode is clearly vasovagal and does not need any further investigation. Because of severity of symptoms, will start treatment with IV Ceftriaxone, and treat for seven days with Cephalexin.  I personally performed the services described in this documentation, which was scribed in my presence. The recorded information has been reviewed and considered.          Dione Booze, MD 12/17/11 [phone]

## 2011-12-16 NOTE — ED Notes (Signed)
Was at work became lightheaded and passed out. Prior to that event she had pain in her groin. Has had hematuria today. IV started by EMS and NS 200cc bolus given. Alert on arrival. States pain continues but she no longer feels lightheaded. Pale.

## 2011-12-16 NOTE — Discharge Instructions (Signed)
Urinary Tract Infection Infections of the urinary tract can start in several places. A bladder infection (cystitis), a kidney infection (pyelonephritis), and a prostate infection (prostatitis) are different types of urinary tract infections (UTIs). They usually get better if treated with medicines (antibiotics) that kill germs. Take all the medicine until it is gone. You or your child may feel better in a few days, but TAKE ALL MEDICINE or the infection may not respond and may become more difficult to treat. HOME CARE INSTRUCTIONS   Drink enough water and fluids to keep the urine clear or pale yellow. Cranberry juice is especially recommended, in addition to large amounts of water.   Avoid caffeine, tea, and carbonated beverages. They tend to irritate the bladder.   Alcohol may irritate the prostate.   Only take over-the-counter or prescription medicines for pain, discomfort, or fever as directed by your caregiver.  To prevent further infections:  Empty the bladder often. Avoid holding urine for long periods of time.   After a bowel movement, women should cleanse from front to back. Use each tissue only once.   Empty the bladder before and after sexual intercourse.  FINDING OUT THE RESULTS OF YOUR TEST Not all test results are available during your visit. If your or your child's test results are not back during the visit, make an appointment with your caregiver to find out the results. Do not assume everything is normal if you have not heard from your caregiver or the medical facility. It is important for you to follow up on all test results. SEEK MEDICAL CARE IF:   There is back pain.   Your baby is older than 3 months with a rectal temperature of 100.5 F (38.1 C) or higher for more than 1 day.   Your or your child's problems (symptoms) are no better in 3 days. Return sooner if you or your child is getting worse.  SEEK IMMEDIATE MEDICAL CARE IF:   There is severe back pain or lower  abdominal pain.   You or your child develops chills.   You have a fever.   Your baby is older than 3 months with a rectal temperature of 102 F (38.9 C) or higher.   Your baby is 54 months old or younger with a rectal temperature of 100.4 F (38 C) or higher.   There is nausea or vomiting.   There is continued burning or discomfort with urination.  MAKE SURE YOU:   Understand these instructions.   Will watch your condition.   Will get help right away if you are not doing well or get worse.  Document Released: 03/20/2005 Document Revised: 05/30/2011 Document Reviewed: 10/23/2006 [facility] Patient Information 2012 [place], [place].  Syncope You have had a fainting (syncopal) spell. A fainting episode is a sudden, short-lived loss of consciousness. It results in complete recovery. It occurs because there has been a temporary shortage of oxygen and/or sugar (glucose) to the brain. CAUSES   Blood pressure pills and other medications that may lower blood pressure below normal. Sudden changes in posture (sudden standing).   Over-medication. Take your medications as directed.   Standing too long. This can cause blood to pool in the legs.   Seizure disorders.   Low blood sugar (hypoglycemia) of diabetes. This more commonly causes coma.   Bearing down to go to the bathroom. This can cause your blood pressure to rise suddenly. Your body compensates by making the blood pressure too low when you stop bearing down.  Hardening of the arteries where the brain temporarily does not receive enough blood.   Irregular heart beat and circulatory problems.   Fear, emotional distress, injury, sight of blood, or illness.  Your caregiver will send you home if the syncope was from non-worrisome causes (benign). Depending on your age and health, you may stay to be monitored and observed. If you return home, have someone stay with you if your caregiver feels that is desirable. It is very important to  keep all follow-up referrals and appointments in order to properly manage this condition. This is a serious problem which can lead to serious illness and death if not carefully managed.  WARNING: Do not drive or operate machinery until your caregiver feels that it is safe for you to do so. SEEK IMMEDIATE MEDICAL CARE IF:   You have another fainting episode or faint while lying or sitting down. DO NOT DRIVE YOURSELF. Call 911 if no other help is available.   You have chest pain, are feeling sick to your stomach (nausea), vomiting or abdominal pain.   You have an irregular heartbeat or one that is very fast (pulse over 120 beats per minute).   You have a loss of feeling in some part of your body or lose movement in your arms or legs.   You have difficulty with speech, confusion, severe weakness, or visual problems.   You become sweaty and/or feel light headed.  Make sure you are rechecked as instructed. Document Released: 06/10/2005 Document Revised: 05/30/2011 Document Reviewed: 01/29/2007 [facility] Patient Information 2012 [place], [place].  Cephalexin tablets or capsules What is this medicine? CEPHALEXIN (sef a LEX in) is a cephalosporin antibiotic. It is used to treat certain kinds of bacterial infections It will not work for colds, flu, or other viral infections. This medicine may be used for other purposes; ask your health care provider or pharmacist if you have questions. What should I tell my health care provider before I take this medicine? They need to know if you have any of these conditions: -kidney disease -stomach or intestine problems, especially colitis -an unusual or allergic reaction to cephalexin, other cephalosporins, penicillins, other antibiotics, medicines, foods, dyes or preservatives -pregnant or trying to get pregnant -breast-feeding How should I use this medicine? Take this medicine by mouth with a full glass of water. Follow the directions on the prescription  label. This medicine can be taken with or without food. Take your medicine at regular intervals. Do not take your medicine more often than directed. Take all of your medicine as directed even if you think you are better. Do not skip doses or stop your medicine early. Talk to your pediatrician regarding the use of this medicine in children. While this drug may be prescribed for selected conditions, precautions do apply. Overdosage: If you think you have taken too much of this medicine contact a poison control center or emergency room at once. NOTE: This medicine is only for you. Do not share this medicine with others. What if I miss a dose? If you miss a dose, take it as soon as you can. If it is almost time for your next dose, take only that dose. Do not take double or extra doses. There should be at least 4 to 6 hours between doses. What may interact with this medicine? -probenecid -some other antibiotics This list may not describe all possible interactions. Give your health care provider a list of all the medicines, herbs, non-prescription drugs, or dietary supplements you use.  Also tell them if you smoke, drink alcohol, or use illegal drugs. Some items may interact with your medicine. What should I watch for while using this medicine? Tell your doctor or health care professional if your symptoms do not begin to improve in a few days. Do not treat diarrhea with over the counter products. Contact your doctor if you have diarrhea that lasts more than 2 days or if it is severe and watery. If you have diabetes, you may get a false-positive result for sugar in your urine. Check with your doctor or health care professional. What side effects may I notice from receiving this medicine? Side effects that you should report to your doctor or health care professional as soon as possible: -allergic reactions like skin rash, itching or hives, swelling of the face, lips, or tongue -breathing problems -pain or  trouble passing urine -redness, blistering, peeling or loosening of the skin, including inside the mouth -severe or watery diarrhea -unusually weak or tired -yellowing of the eyes, skin Side effects that usually do not require medical attention (report to your doctor or health care professional if they continue or are bothersome): -gas or heartburn -genital or anal irritation -headache -joint or muscle pain -nausea, vomiting This list may not describe all possible side effects. Call your doctor for medical advice about side effects. You may report side effects to FDA at 1-800-FDA-1088. Where should I keep my medicine? Keep out of the reach of children. Store at room temperature between 59 and 86 degrees F (15 and 30 degrees C). Throw away any unused medicine after the expiration date. NOTE: This sheet is a summary. It may not cover all possible information. If you have questions about this medicine, talk to your doctor, pharmacist, or health care provider.  2012, [employer]vier/[employer] Standard. (09/14/2007 5:09:13 PM)  Phenazopyridine tablets What is this medicine? PHENAZOPYRIDINE (fen az oh PEER i deen) is a pain reliever. It is used to stop the pain, burning, or discomfort caused by infection or irritation of the urinary tract. This medicine is not an antibiotic. It will not cure a urinary tract infection. This medicine may be used for other purposes; ask your health care provider or pharmacist if you have questions. What should I tell my health care provider before I take this medicine? They need to know if you have any of these conditions: -glucose-6-phosphate dehydrogenase (G6PD) deficiency -kidney disease -an unusual or allergic reaction to phenazopyridine, other medicines, foods, dyes, or preservatives -pregnant or trying to get pregnant -breast-feeding How should I use this medicine? Take this medicine by mouth with a glass of water. Follow the directions on the prescription label. Take  after meals. Take your doses at regular intervals. Do not take your medicine more often than directed. Do not skip doses or stop your medicine early even if you feel better. Do not stop taking except on your doctor's advice. Talk to your pediatrician regarding the use of this medicine in children. Special care may be needed. Overdosage: If you think you have taken too much of this medicine contact a poison control center or emergency room at once. NOTE: This medicine is only for you. Do not share this medicine with others. What if I miss a dose? If you miss a dose, take it as soon as you can. If it is almost time for your next dose, take only that dose. Do not take double or extra doses. What may interact with this medicine? Interactions are not expected. This list may  not describe all possible interactions. Give your health care provider a list of all the medicines, herbs, non-prescription drugs, or dietary supplements you use. Also tell them if you smoke, drink alcohol, or use illegal drugs. Some items may interact with your medicine. What should I watch for while using this medicine? Tell your doctor or health care professional if your symptoms do not improve or if they get worse. This medicine colors body fluids red. This effect is harmless and will go away after you are done taking the medicine. It will change urine to an dark orange or red color. The red color may stain clothing. Soft contact lenses may become permanently stained. It is best not to wear soft contact lenses while taking this medicine. If you are diabetic you may get a false positive result for sugar in your urine. Talk to your health care provider. What side effects may I notice from receiving this medicine? Side effects that you should report to your doctor or health care professional as soon as possible: -allergic reactions like skin rash, itching or hives, swelling of the face, lips, or tongue -blue or purple color of the  skin -difficulty breathing -fever -less urine -unusual bleeding, bruising -unusual tired, weak -vomiting -yellowing of the eyes or skin Side effects that usually do not require medical attention (report to your doctor or health care professional if they continue or are bothersome): -dark urine -headache -stomach upset This list may not describe all possible side effects. Call your doctor for medical advice about side effects. You may report side effects to FDA at 1-800-FDA-1088. Where should I keep my medicine? Keep out of the reach of children. Store at room temperature between 15 and 30 degrees C (59 and 86 degrees F). Protect from light and moisture. Throw away any unused medicine after the expiration date. NOTE: This sheet is a summary. It may not cover all possible information. If you have questions about this medicine, talk to your doctor, pharmacist, or health care provider.  2012, Elsevier/Gold Standard. (01/07/2008 11:04:07 AM)  Tramadol tablets What is this medicine? TRAMADOL (TRA ma dole) is a pain reliever. It is used to treat moderate to severe pain in adults. This medicine may be used for other purposes; ask your health care provider or pharmacist if you have questions. What should I tell my health care provider before I take this medicine? They need to know if you have any of these conditions: -brain tumor -depression -drug abuse or addiction -head injury -if you frequently drink alcohol containing drinks -kidney disease or trouble passing urine -liver disease -lung disease, asthma, or breathing problems -seizures or epilepsy -suicidal thoughts, plans, or attempt; a previous suicide attempt by you or a family member -an unusual or allergic reaction to tramadol, codeine, other medicines, foods, dyes, or preservatives -pregnant or trying to get pregnant -breast-feeding How should I use this medicine? Take this medicine by mouth with a full glass of water. Follow  the directions on the prescription label. If the medicine upsets your stomach, take it with food or milk. Do not take more medicine than you are told to take. Talk to your pediatrician regarding the use of this medicine in children. Special care may be needed. Overdosage: If you think you have taken too much of this medicine contact a poison control center or emergency room at once. NOTE: This medicine is only for you. Do not share this medicine with others. What if I miss a dose? If you miss a  dose, take it as soon as you can. If it is almost time for your next dose, take only that dose. Do not take double or extra doses. What may interact with this medicine? Do not take this medicine with any of the following medications: -MAOIs like Carbex, Eldepryl, Marplan, Nardil, and Parnate This medicine may also interact with the following medications: -alcohol or medicines that contain alcohol -antihistamines -benzodiazepines -bupropion -carbamazepine or oxcarbazepine -clozapine -cyclobenzaprine -digoxin -furazolidone -linezolid -medicines for depression, anxiety, or psychotic disturbances -medicines for migraine headache like almotriptan, eletriptan, frovatriptan, naratriptan, rizatriptan, sumatriptan, zolmitriptan -medicines for pain like pentazocine, buprenorphine, butorphanol, meperidine, nalbuphine, and propoxyphene -medicines for sleep -muscle relaxants -naltrexone -phenobarbital -phenothiazines like perphenazine, thioridazine, chlorpromazine, mesoridazine, fluphenazine, prochlorperazine, promazine, and trifluoperazine -procarbazine -warfarin This list may not describe all possible interactions. Give your health care provider a list of all the medicines, herbs, non-prescription drugs, or dietary supplements you use. Also tell them if you smoke, drink alcohol, or use illegal drugs. Some items may interact with your medicine. What should I watch for while using this medicine? Tell your  doctor or health care professional if your pain does not go away, if it gets worse, or if you have new or a different type of pain. You may develop tolerance to the medicine. Tolerance means that you will need a higher dose of the medicine for pain relief. Tolerance is normal and is expected if you take this medicine for a long time. Do not suddenly stop taking your medicine because you may develop a severe reaction. Your body becomes used to the medicine. This does NOT mean you are addicted. Addiction is a behavior related to getting and using a drug for a non-medical reason. If you have pain, you have a medical reason to take pain medicine. Your doctor will tell you how much medicine to take. If your doctor wants you to stop the medicine, the dose will be slowly lowered over time to avoid any side effects. You may get drowsy or dizzy. Do not drive, use machinery, or do anything that needs mental alertness until you know how this medicine affects you. Do not stand or sit up quickly, especially if you are an older patient. This reduces the risk of dizzy or fainting spells. Alcohol can increase or decrease the effects of this medicine. Avoid alcoholic drinks. You may have constipation. Try to have a bowel movement at least every 2 to 3 days. If you do not have a bowel movement for 3 days, call your doctor or health care professional. Your mouth may get dry. Chewing sugarless gum or sucking hard candy, and drinking plenty of water may help. Contact your doctor if the problem does not go away or is severe. What side effects may I notice from receiving this medicine? Side effects that you should report to your doctor or health care professional as soon as possible: -allergic reactions like skin rash, itching or hives, swelling of the face, lips, or tongue -breathing difficulties, wheezing -confusion -itching -light headedness or fainting spells -redness, blistering, peeling or loosening of the skin,  including inside the mouth -seizures Side effects that usually do not require medical attention (report to your doctor or health care professional if they continue or are bothersome): -constipation -dizziness -drowsiness -headache -nausea, vomiting This list may not describe all possible side effects. Call your doctor for medical advice about side effects. You may report side effects to FDA at 1-800-FDA-1088. Where should I keep my medicine? Keep out of the  reach of children. Store at room temperature between 15 and 30 degrees C (59 and 86 degrees F). Keep container tightly closed. Throw away any unused medicine after the expiration date. NOTE: This sheet is a summary. It may not cover all possible information. If you have questions about this medicine, talk to your doctor, pharmacist, or health care provider.  2012, Elsevier/Gold Standard. (02/21/2010 11:55:44 AM)

## 2011-12-17 ENCOUNTER — Telehealth: Payer: Self-pay | Admitting: Family Medicine

## 2011-12-17 NOTE — Telephone Encounter (Signed)
Caller: Reid/Mother is calling with a question about Reglan, Keflex, Pyridium.  Was seen in the ED last [facility], [facility].  Was told that she has a UTI and given these medications for same.  Wants to make sure that they are appropriate? To continue with the medications.  Denies any possibility of pregnancy.  Had a GYN appt. on 6/24 as well.

## 2011-12-18 LAB — URINE CULTURE

## 2011-12-19 NOTE — ED Notes (Signed)
+   Urine Patient treated with Keflex-sensitive to same-chart appended per protocol MD. 

## 2011-12-30 ENCOUNTER — Ambulatory Visit (INDEPENDENT_AMBULATORY_CARE_PROVIDER_SITE_OTHER): Payer: BC Managed Care – PPO | Admitting: Family Medicine

## 2011-12-30 ENCOUNTER — Encounter: Payer: Self-pay | Admitting: Family Medicine

## 2011-12-30 VITALS — BP 102/70 | HR 76 | Temp 98.3°F | Wt 126.0 lb

## 2011-12-30 DIAGNOSIS — N390 Urinary tract infection, site not specified: Secondary | ICD-10-CM | POA: Insufficient documentation

## 2011-12-30 LAB — POCT URINALYSIS DIPSTICK
Glucose, UA: NEGATIVE
Ketones, UA: NEGATIVE
Spec Grav, UA: 1.015
Urobilinogen, UA: NEGATIVE

## 2011-12-30 MED ORDER — CIPROFLOXACIN HCL 500 MG PO TABS: 500.0000 mg | ORAL_TABLET | Freq: Two times a day (BID) | ORAL | Status: AC

## 2011-12-30 NOTE — Progress Notes (Signed)
SUBJECTIVE: Mariah [name] is a 26 y.o. female who complains of urinary frequency, urgency and dysuria x 2 days, without flank pain, fever, chills, or abnormal vaginal discharge or bleeding.   Patient Active Problem List  Diagnosis  . VIRAL INFECTION  . ANXIETY DISORDER  . PANIC ATTACK  . URI  . ALLERGIC RHINITIS  . ASTHMA, ACUTE  . SYNCOPE  . WHEEZING  . PAP SMEAR, ABNORMAL  . Routine general medical examination at a health care facility  . Contraception management  . Left knee pain  . UTI (lower urinary tract infection)   Past Medical History  Diagnosis Date  . Panic disorder without agoraphobia   . Anxiety state, unspecified    Past Surgical History  Procedure Date  . Tonsillectomy     01/26/07- Dr. Jenne Pane   History  Substance Use Topics  . Smoking status: Former Games developer  . Smokeless tobacco: Not on file  . Alcohol Use: Yes   Family History  Problem Relation Age of Onset  . Hypertension Father   . Depression Mother   . Diabetes    . Cancer    . Stroke     Allergies  Allergen Reactions  . Codeine Nausea Only and Other (See Comments)    Passes out  . Vicodin (Hydrocodone-Acetaminophen) Nausea Only and Other (See Comments)    Passes out   Current Outpatient Prescriptions on File Prior to Visit  Medication Sig Dispense Refill  . albuterol (PROVENTIL HFA;VENTOLIN HFA) 108 (90 BASE) MCG/ACT inhaler Inhale 1-2 puffs into the lungs every 6 (six) hours as needed. For wheezing or chest tightness      . fluticasone (FLONASE) 50 MCG/ACT nasal spray Place 1 spray into the nose daily.      Marland Kitchen ibuprofen (ADVIL,MOTRIN) 200 MG tablet Take 400 mg by mouth every 6 (six) hours as needed. For pain      . levonorgestrel (MIRENA) 20 MCG/24HR IUD 1 each by Intrauterine route once. Inserted May 2013      . Melatonin 1 MG TABS Take 1 tablet by mouth at bedtime as needed. For sleep      . metoCLOPramide (REGLAN) 10 MG tablet Take 1 tablet (10 mg total) by mouth every 6 (six) hours as  needed (nausea).  30 tablet  0  . montelukast (SINGULAIR) 10 MG tablet Take 1 tablet (10 mg total) by mouth at bedtime.  30 tablet  11   The PMH, PSH, Social History, Family History, Medications, and allergies have been reviewed in [facility], and have been updated if relevant.  OBJECTIVE:  BP 102/70  Pulse 76  Temp 98.3 F (36.8 C)  Wt 126 lb (57.153 kg)  LMP 11/15/2011  Appears well, in no apparent distress.  Vital signs are normal. The abdomen is soft without tenderness, guarding, mass, rebound or organomegaly. No CVA tenderness or inguinal adenopathy noted. Urine dipstick shows positive for RBC's.    ASSESSMENT: UTI uncomplicated without evidence of pyelonephritis  PLAN: Treatment per orders - cipro 500 mg twice daily x 3 days, urine cx, also push fluids, may use Pyridium OTC prn. Call or return to clinic prn if these symptoms worsen or fail to improve as anticipated.

## 2011-12-30 NOTE — Patient Instructions (Addendum)
Good to see you. We will call you with your culture results. Please take cipro 500 mg twice daily x 3 days. Drink plenty of fluids.

## 2011-12-31 LAB — URINE CULTURE
Colony Count: NO GROWTH
Organism ID, Bacteria: NO GROWTH

## 2012-07-27 ENCOUNTER — Other Ambulatory Visit: Payer: Self-pay | Admitting: *Deleted

## 2012-07-27 MED ORDER — MONTELUKAST SODIUM 10 MG PO TABS: 10.0000 mg | ORAL_TABLET | Freq: Every day | ORAL | Status: DC

## 2012-08-05 ENCOUNTER — Encounter: Payer: Self-pay | Admitting: Family Medicine

## 2012-08-05 ENCOUNTER — Ambulatory Visit (INDEPENDENT_AMBULATORY_CARE_PROVIDER_SITE_OTHER): Payer: BC Managed Care – PPO | Admitting: Family Medicine

## 2012-08-05 VITALS — BP 100/62 | HR 84 | Temp 98.8°F | Ht 62.0 in | Wt 127.8 lb

## 2012-08-05 DIAGNOSIS — J069 Acute upper respiratory infection, unspecified: Secondary | ICD-10-CM

## 2012-08-05 MED ORDER — AMOXICILLIN 500 MG PO CAPS: 1000.0000 mg | ORAL_CAPSULE | Freq: Two times a day (BID) | ORAL | Status: DC

## 2012-08-05 MED ORDER — FLUTICASONE PROPIONATE 50 MCG/ACT NA SUSP: 1.0000 | Freq: Every day | NASAL | Status: DC

## 2012-08-05 NOTE — Progress Notes (Signed)
Patient Name: Mariah [name] Date of Birth: 1985-12-16 Medical Record Number: [id number]  History of Present Illness:  Patent presents with runny nose, sneezing, cough, sore throat, malaise and minimal / low-grade fever .  2 days ago, started to sneeze a lot and was allergic to smoke and exposure. Now all in head and ears are hurting and hurting behind eyes. Took some sudafed all day yesterday.   ? recent exposure to others with similar symptoms.   The patent denies sore throat as the primary complaint. Denies sthortness of breath/wheezing, high fever, chest pain, rhinits for more than 14 days, significant myalgia, otalgia, facial pain, abdominal pain, changes in bowel or bladder.  PMH, PHS, Allergies, Problem List, Medications, Family History, and Social History have all been reviewed.  Patient Active Problem List  Diagnosis  . VIRAL INFECTION  . ANXIETY DISORDER  . PANIC ATTACK  . URI  . ALLERGIC RHINITIS  . ASTHMA, ACUTE  . SYNCOPE  . WHEEZING  . PAP SMEAR, ABNORMAL  . Routine general medical examination at a health care facility  . Contraception management  . Left knee pain  . UTI (lower urinary tract infection)    Past Medical History  Diagnosis Date  . Panic disorder without agoraphobia   . Anxiety state, unspecified     Past Surgical History  Procedure Laterality Date  . Tonsillectomy      01/26/07- Dr. Jenne Pane    History  Substance Use Topics  . Smoking status: Former Games developer  . Smokeless tobacco: Not on file  . Alcohol Use: Yes    Family History  Problem Relation Age of Onset  . Hypertension Father   . Depression Mother   . Diabetes    . Cancer    . Stroke      Allergies  Allergen Reactions  . Codeine Nausea Only and Other (See Comments)    Passes out  . Vicodin (Hydrocodone-Acetaminophen) Nausea Only and Other (See Comments)    Passes out    Current Outpatient Prescriptions on File Prior to Visit  Medication Sig Dispense Refill  . albuterol  (PROVENTIL HFA;VENTOLIN HFA) 108 (90 BASE) MCG/ACT inhaler Inhale 1-2 puffs into the lungs every 6 (six) hours as needed. For wheezing or chest tightness      . ibuprofen (ADVIL,MOTRIN) 200 MG tablet Take 400 mg by mouth every 6 (six) hours as needed. For pain      . levonorgestrel (MIRENA) 20 MCG/24HR IUD 1 each by Intrauterine route once. Inserted May 2013      . Melatonin 1 MG TABS Take 1 tablet by mouth at bedtime as needed. For sleep      . montelukast (SINGULAIR) 10 MG tablet Take 1 tablet (10 mg total) by mouth at bedtime.  30 tablet  3   No current facility-administered medications on file prior to visit.    Review of Systems: as above, eating and drinking - tolerating PO. Urinating normally. No excessive vomitting or diarrhea. O/w as above.  Physical Exam:  Filed Vitals:   08/05/12 1009  BP: 100/62  Pulse: 84  Temp: 98.8 F (37.1 C)  TempSrc: Oral  Height: 5\' 2"  (1.575 m)  Weight: 127 lb 12 oz (57.947 kg)  SpO2: 97%    GEN: WDWN, Non-toxic, Atraumatic, normocephalic. A and O x 3. HEENT: Oropharynx clear without exudate, MMM, no significant LAD, mild rhinnorhea Ears: TM clear, COL visualized with good landmarks CV: RRR, no m/g/r. Pulm: CTA B, no wheezes, rhonchi, or crackles, normal  respiratory effort. EXT: no c/c/e Psych: well oriented, neither depressed nor anxious in appearance  A/P: 1. URI. Supportive care reviewed with patient. See patient instruction section. Likely URI, with oncoming snow, will also give her some abx to hold  Meds ordered this encounter  Medications  . fluticasone (FLONASE) 50 MCG/ACT nasal spray    Sig: Place 1 spray into the nose daily.    Dispense:  16 g    Refill:  2  . amoxicillin (AMOXIL) 500 MG capsule    Sig: Take 2 capsules (1,000 mg total) by mouth 2 (two) times daily.    Dispense:  40 capsule    Refill:  0

## 2012-09-09 ENCOUNTER — Other Ambulatory Visit: Payer: Self-pay | Admitting: *Deleted

## 2012-09-09 MED ORDER — ALBUTEROL SULFATE HFA 108 (90 BASE) MCG/ACT IN AERS: 1.0000 | INHALATION_SPRAY | Freq: Four times a day (QID) | RESPIRATORY_TRACT | Status: DC | PRN

## 2012-09-10 ENCOUNTER — Other Ambulatory Visit: Payer: Self-pay | Admitting: *Deleted

## 2012-09-10 NOTE — Telephone Encounter (Signed)
Opened in error

## 2012-09-10 NOTE — Telephone Encounter (Signed)
Refill called to [facility], sent to wrong pharmacy  Yesterday.  Refill cancelled at [facility].

## 2012-09-21 ENCOUNTER — Ambulatory Visit (INDEPENDENT_AMBULATORY_CARE_PROVIDER_SITE_OTHER): Payer: BC Managed Care – PPO | Admitting: Family Medicine

## 2012-09-21 ENCOUNTER — Encounter: Payer: Self-pay | Admitting: Family Medicine

## 2012-09-21 VITALS — BP 109/70 | HR 75 | Temp 98.0°F | Wt 120.2 lb

## 2012-09-21 DIAGNOSIS — B09 Unspecified viral infection characterized by skin and mucous membrane lesions: Secondary | ICD-10-CM

## 2012-09-21 DIAGNOSIS — R21 Rash and other nonspecific skin eruption: Secondary | ICD-10-CM | POA: Insufficient documentation

## 2012-09-21 NOTE — Patient Instructions (Signed)
I think this is a viral exanthem or viral rash .   Treat with antihistamine like claritin and hydrocortisone cream as needed. May see fever or upper respiratory symptoms develop over next few days.  Viral Exanthems, Adult Many viral infections of the skin are called viral exanthems. Exanthem is another name for a rash or skin eruption. The most common viral exanthems include the following:  [place] measles or rubella.  Measles or rubeola.  Roseola.  Parvovirus B19 (Erythema infectiosum or Fifth disease).  Chickenpox or varicella. DIAGNOSIS  Sometimes, other problems may cause a rash that looks like a viral exanthem. Most often, your caregiver can determine whether you have a viral exanthem by looking at the rash. They usually have distinct patterns or appearance. Lab work may be done if the diagnosis is uncertain. Sometimes, a small tissue sample (biopsy) of the rash may need to be taken. TREATMENT  Immunizations have led to a decrease in the number of cases of measles, mumps, and rubella. Viral exanthems may require clinical treatment if a bacterial infection or other problems follow. The rash may be associated with:  Minor sore throat.  Aches and pains.  Runny nose.  Watery eyes.  Tiredness.  Some coughs.  Gastrointestinal infections causing nausea, vomiting, and diarrhea. Viral exanthems do not respond to antibiotic medicines, because they are not caused by bacteria. HOME CARE INSTRUCTIONS   Only take over-the-counter or prescription medicines for pain, discomfort, diarrhea, or fever as directed by your caregiver.  Drink enough water and fluids to keep your urine clear or pale yellow. SEEK MEDICAL CARE IF:  You develop swollen neck glands. This may feel like lumps or bumps in the neck.  You develop tenderness over your sinuses.  You are not feeling partly better after 3 days.  You develop muscle aches.  You are feeling more tired than you would expect.  You get a  persistent cough with mucus. SEEK IMMEDIATE MEDICAL CARE IF:   You have a fever.  You develop red eyes or eye pain.  You develop sores in your mouth and difficulty drinking or eating.  You develop a sore throat with pus and difficulty swallowing.  You develop neck pain or a stiff neck.  You develop a severe headache.  You develop vomiting that will not stop. Document Released: 08/31/2002 Document Revised: 09/02/2011 Document Reviewed: 08/28/2010 [facility] Patient Information 2013 [place], [place].

## 2012-09-21 NOTE — Assessment & Plan Note (Addendum)
Anticipate viral exanthem although no other sxs currently (early on in course) Less likely allergic reaction/hives. Treat with continued otc steroid cream, antihistamine and benadryl prn. Update if progressing or deteriorating or new sxs.

## 2012-09-21 NOTE — Progress Notes (Signed)
  Subjective:    Patient ID: Mariah [name], female    DOB: 25-Feb-1986, 27 y.o.   MRN: [id number]  HPI CC: rash  H/o keratosis pilaris.  Presents with 1d h/o papular rash on arms and hands.  Not pruritic.  Not blisters. Only on hands and arms. Spares trunk, face, legs and feet. [place], wonders if exposed to something - however, rash started prior to trip.. Benadryl oral and topical yesterday helped rash.  Also used hydrocortisone  No new lotions, detergents, soaps, shampoos, foods, medicines. No oral lesions.  No fevers/chills, joint pains or nausea. No sick contacts.  No preceding viral URI sxs. Recently treated for sinusitis with amoxicillin 1.5 month ago  Past Medical History  Diagnosis Date  . Panic disorder without agoraphobia   . Anxiety state, unspecified      Review of Systems Per HPI    Objective:   Physical Exam  Nursing note and vitals reviewed. Constitutional: She appears well-developed and well-nourished. No distress.  HENT:  Head: Normocephalic and atraumatic.  Mouth/Throat: Oropharynx is clear and moist. No oropharyngeal exudate.  No oral lesions  Eyes: Conjunctivae and EOM are normal. Pupils are equal, round, and reactive to light.  Neck: Normal range of motion. Neck supple.  Cardiovascular: Normal rate, regular rhythm, normal heart sounds and intact distal pulses.   No murmur heard. Pulmonary/Chest: Effort normal and breath sounds normal. No respiratory distress. She has no wheezes. She has no rales.  Musculoskeletal: She exhibits no edema.  Skin: Skin is warm and dry. Rash noted.  Blanching papular rash with ring of erythema on bilateral dorsal hands, forearms.  Spares rest of body.  One spot on right palmar index finger.       Assessment & Plan:

## 2012-09-23 ENCOUNTER — Ambulatory Visit (INDEPENDENT_AMBULATORY_CARE_PROVIDER_SITE_OTHER): Payer: BC Managed Care – PPO | Admitting: Family Medicine

## 2012-09-23 VITALS — BP 98/60 | HR 90 | Temp 98.7°F | Resp 18 | Ht 62.75 in | Wt 123.0 lb

## 2012-09-23 DIAGNOSIS — L500 Allergic urticaria: Secondary | ICD-10-CM

## 2012-09-23 MED ORDER — METHYLPREDNISOLONE ACETATE 80 MG/ML IJ SUSP
80.0000 mg | Freq: Once | INTRAMUSCULAR | Status: AC
  Administered 2012-09-23: 80 mg via INTRAMUSCULAR

## 2012-09-23 MED ORDER — PREDNISONE 20 MG PO TABS: ORAL_TABLET | ORAL | Status: DC

## 2012-09-23 NOTE — Progress Notes (Signed)
Subjective: Patient has been having a rash for several days on her arms and legs and face target-like dots this are scattered over her. She's tried some OTC antihistamine with mild relief only.  Objective: Erythematous areas up to about a centimeter in diameter scattered on arms some on her ankles and top of feet, some on face. None under her clothing and hypertrophic but there are some on the lower legs. She says she's only had long pants on. She works at a pharmacy. She has Mirena for contraception. She is not on any other prescription meds except for Singulair.  Assessment: Urticaria new  Plan: Depo-Medrol Prednisone taper Zantac and Zyrtec  Return if worse  This is an atypical appearing rash but it certainly seems urticarial IN A photo exposed area.

## 2012-09-23 NOTE — Patient Instructions (Signed)
Take his Zantac 150 mg twice daily  Take Zyrtec one daily  Take prednisone in tapered dose fashion as directed  Return if worse

## 2012-10-15 ENCOUNTER — Ambulatory Visit (INDEPENDENT_AMBULATORY_CARE_PROVIDER_SITE_OTHER): Payer: BC Managed Care – PPO | Admitting: Family Medicine

## 2012-10-15 ENCOUNTER — Telehealth: Payer: Self-pay

## 2012-10-15 VITALS — BP 132/74 | HR 93 | Temp 98.2°F | Resp 16 | Ht 61.5 in | Wt 122.2 lb

## 2012-10-15 DIAGNOSIS — L259 Unspecified contact dermatitis, unspecified cause: Secondary | ICD-10-CM

## 2012-10-15 DIAGNOSIS — J309 Allergic rhinitis, unspecified: Secondary | ICD-10-CM

## 2012-10-15 MED ORDER — METHYLPREDNISOLONE (PAK) 4 MG PO TABS: ORAL_TABLET | ORAL | Status: DC

## 2012-10-15 NOTE — Telephone Encounter (Signed)
PT SAW DR. HOPPER FOR HIVES AND WAS PRESCRIBED PREDNISONE.  SHE SAID SHE HAD A BAD REACTION TO PREDNISONE AND THAT THE HIVES ARE BACK AGAIN.  WANTS TO KNOW IF WE COULD PRESCRIBE SOMETHING DIFFERENT FOR THEM. [phone]

## 2012-10-15 NOTE — Telephone Encounter (Signed)
Returned patient call- She states she was disoriented, had a headache, nauseated from the prednisone. She states the rash is back in the same areas. She states she was doing yard work yesterday and the rash started today as soon as she woke up. Advised pt to come in to be evaluated. Pt understands.

## 2012-10-15 NOTE — Progress Notes (Signed)
[address]   [place], [place]  [address]   [phone]  Subjective:    Patient ID: Mariah [name], female    DOB: 09-06-1985, 27 y.o.   MRN: [id number]  HPI This 27 y.o. female presents for evaluation of recurrent rash.  S/p evaluation by Dr. Alwyn Ren on 09/25/12; s/p Steroid injection with prednisone taper. Upon stopping Prednisone, developed nausea, vomiting, felt horrible.  Very sensitive to medications in general.  Prescribed 20mg  tablets.  Does not want it to spread.  Was located inner legs due to sweating, constriction.  Rash completely resolved with steroid treatment.  Pulled weeds yesterday, recurrent rash today.  Rash now on both hands, face, ears, forearms.  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  Ears get hot.  No scratching.  Refilled Prednisone, took 30mg , Ranitidine, Zyrtec 10mg .     Review of Systems  Constitutional: Negative for fever, chills, diaphoresis and fatigue.  HENT: Negative for mouth sores and trouble swallowing.   Respiratory: Negative for shortness of breath.   Genitourinary: Negative for genital sores.  Skin: Positive for color change and rash. Negative for pallor and wound.    Past Medical History  Diagnosis Date  . Panic disorder without agoraphobia   . Anxiety state, unspecified   . Allergy   . Asthma     Past Surgical History  Procedure Laterality Date  . Tonsillectomy      01/26/07- Dr. Jenne Pane    Prior to Admission medications   Medication Sig Start Date End Date Taking? Authorizing Provider  albuterol (PROVENTIL HFA;VENTOLIN HFA) 108 (90 BASE) MCG/ACT inhaler Inhale 1-2 puffs into the lungs every 6 (six) hours as needed. For wheezing or chest tightness 09/09/12  Yes Dianne Dun, MD  diphenhydrAMINE (BENADRYL) 25 mg capsule Take 50 mg by mouth every 6 (six) hours as needed for itching.   Yes Historical Provider, MD  fluticasone (FLONASE) 50 MCG/ACT nasal spray Place 1 spray into the nose daily. 08/05/12  Yes Spencer Copland, MD    ibuprofen (ADVIL,MOTRIN) 200 MG tablet Take 400 mg by mouth every 6 (six) hours as needed. For pain   Yes Historical Provider, MD  levonorgestrel (MIRENA) 20 MCG/24HR IUD 1 each by Intrauterine route once. Inserted May 2013   Yes Historical Provider, MD  Melatonin 1 MG TABS Take 1 tablet by mouth at bedtime as needed. For sleep   Yes Historical Provider, MD  montelukast (SINGULAIR) 10 MG tablet Take 1 tablet (10 mg total) by mouth at bedtime. 07/27/12  Yes Dianne Dun, MD  methylPREDNIsolone (MEDROL DOSPACK) 4 MG tablet follow package directions 10/15/12   Ethelda Chick, MD  predniSONE (DELTASONE) 20 MG tablet Take 3 daily for 2 days, then 2 daily for 2 days, then one daily for 2 days. 09/23/12   Peyton Najjar, MD    Allergies  Allergen Reactions  . Codeine Nausea Only and Other (See Comments)    Passes out  . Vicodin (Hydrocodone-Acetaminophen) Nausea Only and Other (See Comments)    Passes out    History   Social History  . Marital Status: Single    Spouse Name: N/A    Number of Children: 0  . Years of Education: N/A   Occupational History  . Student   .      Pharmacy Tech   Social History Main Topics  . Smoking status: Former Games developer  . Smokeless tobacco: Not on file  . Alcohol Use: Yes  Comment: very rarely  . Drug Use: No  . Sexually Active: Not on file   Other Topics Concern  . Not on file   Social History Narrative   [employer]-majoring in biology. Wants to work in Oceanographer. Works 30 hours per week at [employer]          Family History  Problem Relation Age of Onset  . Hypertension Father   . Depression Mother   . Diabetes    . Cancer    . Stroke         Objective:   Physical Exam  Nursing note and vitals reviewed. Constitutional: She is oriented to person, place, and time. She appears well-developed and well-nourished. No distress.  HENT:  Head: Normocephalic and atraumatic.  Mouth/Throat: Oropharynx is clear and moist.  Eyes: Conjunctivae and EOM are  normal. Pupils are equal, round, and reactive to light.  Neck: Normal range of motion. Neck supple.  Lymphadenopathy:    She has no cervical adenopathy.  Neurological: She is alert and oriented to person, place, and time.  Skin: Rash noted. She is not diaphoretic. There is erythema.  Scattered slightly linear-annular rash B wrists and distal forearms.  No vesicles or pustules.  B external ears with erythematous flat rash.  Small scattered rash facial.   Psychiatric: She has a normal mood and affect. Her behavior is normal.        Assessment & Plan:  Contact dermatitis  ALLERGIC RHINITIS   1. Contact Dermatitis:  New/recurrent; onset 24 hours after working in yard.  Rx for Methylprednisolone dose pack provided due to intolerance to Prednisone.  Continue Zyrtec daily and Zantac bid for next two weeks.  RTC for acute worsening. 2.  Allergic Rhinitis: Stable; recommend taking Zyrtec daily during spring months due to skin sensitivity; appointment in upcoming month with allergist.    Meds ordered this encounter  Medications  . methylPREDNIsolone (MEDROL DOSPACK) 4 MG tablet    Sig: follow package directions    Dispense:  21 tablet    Refill:  0

## 2012-10-15 NOTE — Patient Instructions (Addendum)

## 2012-11-17 ENCOUNTER — Other Ambulatory Visit: Payer: Self-pay | Admitting: *Deleted

## 2012-11-17 MED ORDER — MONTELUKAST SODIUM 10 MG PO TABS: 10.0000 mg | ORAL_TABLET | Freq: Every day | ORAL | Status: DC

## 2013-06-22 ENCOUNTER — Encounter: Payer: Self-pay | Admitting: Internal Medicine

## 2013-06-22 ENCOUNTER — Ambulatory Visit (INDEPENDENT_AMBULATORY_CARE_PROVIDER_SITE_OTHER): Payer: BC Managed Care – PPO | Admitting: Internal Medicine

## 2013-06-22 VITALS — BP 100/60 | HR 101 | Temp 97.8°F | Wt 126.0 lb

## 2013-06-22 DIAGNOSIS — R509 Fever, unspecified: Secondary | ICD-10-CM

## 2013-06-22 DIAGNOSIS — J111 Influenza due to unidentified influenza virus with other respiratory manifestations: Secondary | ICD-10-CM | POA: Insufficient documentation

## 2013-06-22 LAB — POCT INFLUENZA A/B: Influenza B, POC: POSITIVE

## 2013-06-22 MED ORDER — OSELTAMIVIR PHOSPHATE 75 MG PO CAPS: 75.0000 mg | ORAL_CAPSULE | Freq: Two times a day (BID) | ORAL | Status: DC

## 2013-06-22 NOTE — Assessment & Plan Note (Signed)
Rapid test clearly positive Discussed supportive care Discussed pros and cons of tamiflu ---will try it

## 2013-06-22 NOTE — Progress Notes (Signed)
Pre-visit discussion using our clinic review tool. No additional management support is needed unless otherwise documented below in the visit note.  

## 2013-06-22 NOTE — Progress Notes (Signed)
   Subjective:    Patient ID: Mariah [name], female    DOB: [birth year], 27 y.o.   MRN: [id number]  HPI Has been sick for 2 days Temp spikes up to 102--better with aspirin Awakens with fever again Some chills, then hot and sweats Was exposed to the flu  Has chest tightness Hurts to swallow--can taste mucus Only occasional cough---but really hurts (upper sternum) Deep breaths cause wheeze and cough Not SOB  Has disoriented feeling and sluggish Slight sore thorat---did salt water gargles which helped Some headache--all over No ear pain Some body aches  Current Outpatient Prescriptions on File Prior to Visit  Medication Sig Dispense Refill  . albuterol (PROVENTIL HFA;VENTOLIN HFA) 108 (90 BASE) MCG/ACT inhaler Inhale 1-2 puffs into the lungs every 6 (six) hours as needed. For wheezing or chest tightness  1 Inhaler  0  . fluticasone (FLONASE) 50 MCG/ACT nasal spray Place 1 spray into the nose daily.  16 g  2  . ibuprofen (ADVIL,MOTRIN) 200 MG tablet Take 400 mg by mouth every 6 (six) hours as needed. For pain      . levonorgestrel (MIRENA) 20 MCG/24HR IUD 1 each by Intrauterine route once. Inserted May 2013      . montelukast (SINGULAIR) 10 MG tablet Take 1 tablet (10 mg total) by mouth at bedtime.  30 tablet  5   No current facility-administered medications on file prior to visit.    Allergies  Allergen Reactions  . Codeine Nausea Only and Other (See Comments)    Passes out  . Vicodin [Hydrocodone-Acetaminophen] Nausea Only and Other (See Comments)    Passes out    Past Medical History  Diagnosis Date  . Panic disorder without agoraphobia   . Anxiety state, unspecified   . Allergy   . Asthma     Past Surgical History  Procedure Laterality Date  . Tonsillectomy      01/26/07- Dr. Jenne Pane    Family History  Problem Relation Age of Onset  . Hypertension Father   . Depression Mother   . Diabetes    . Cancer    . Stroke       Review of Systems No new rash No  vomiting or diarrhea Able to eat okay     Objective:   Physical Exam  Constitutional: She appears well-developed and well-nourished. No distress.  HENT:  No sinus tenderness Mild nasal congestion Pharynx mild injection without exudate TMs normal  Neck: Normal range of motion. Neck supple. No thyromegaly present.  Bilateral non tender anterior cervical nodes  Pulmonary/Chest: Effort normal and breath sounds normal. No respiratory distress. She has no wheezes. She has no rales.  No dullness  Lymphadenopathy:    She has cervical adenopathy.  Skin: No rash noted.          Assessment & Plan:

## 2013-06-22 NOTE — Patient Instructions (Signed)

## 2014-08-22 ENCOUNTER — Ambulatory Visit (INDEPENDENT_AMBULATORY_CARE_PROVIDER_SITE_OTHER): Payer: BLUE CROSS/BLUE SHIELD | Admitting: Internal Medicine

## 2014-08-22 ENCOUNTER — Encounter: Payer: Self-pay | Admitting: Internal Medicine

## 2014-08-22 VITALS — BP 98/66 | HR 78 | Temp 98.5°F | Wt 118.0 lb

## 2014-08-22 DIAGNOSIS — R5383 Other fatigue: Secondary | ICD-10-CM

## 2014-08-22 DIAGNOSIS — L509 Urticaria, unspecified: Secondary | ICD-10-CM

## 2014-08-22 LAB — COMPREHENSIVE METABOLIC PANEL
ALT: 11 U/L (ref 0–35)
AST: 15 U/L (ref 0–37)
Albumin: 4.2 g/dL (ref 3.5–5.2)
Alkaline Phosphatase: 43 U/L (ref 39–117)
BILIRUBIN TOTAL: 0.4 mg/dL (ref 0.2–1.2)
BUN: 13 mg/dL (ref 6–23)
CALCIUM: 9.1 mg/dL (ref 8.4–10.5)
CHLORIDE: 104 meq/L (ref 96–112)
CO2: 28 meq/L (ref 19–32)
Creatinine, Ser: 0.83 mg/dL (ref 0.40–1.20)
GFR: 87.22 mL/min (ref >60.00)
Glucose, Bld: 94 mg/dL (ref 70–99)
Potassium: 4 mEq/L (ref 3.5–5.1)
SODIUM: 136 meq/L (ref 135–145)
Total Protein: 7.2 g/dL (ref 6.0–8.3)

## 2014-08-22 LAB — CBC WITH DIFFERENTIAL/PLATELET
BASOS ABS: 0 10*3/uL (ref 0.0–0.1)
Basophils Relative: 0.4 % (ref 0.0–3.0)
Eosinophils Absolute: 0.5 10*3/uL (ref 0.0–0.7)
Eosinophils Relative: 4.1 % (ref 0.0–5.0)
HCT: 40.4 % (ref 36.0–46.0)
Hemoglobin: 13.5 g/dL (ref 12.0–15.0)
LYMPHS PCT: 19.5 % (ref 12.0–46.0)
Lymphs Abs: 2.2 10*3/uL (ref 0.7–4.0)
MCHC: 33.5 g/dL (ref 30.0–36.0)
MCV: 81.1 fl (ref 78.0–100.0)
MONOS PCT: 5.5 % (ref 3.0–12.0)
Monocytes Absolute: 0.6 10*3/uL (ref 0.1–1.0)
NEUTROS ABS: 7.8 10*3/uL — AB (ref 1.4–7.7)
NEUTROS PCT: 70.5 % (ref 43.0–77.0)
PLATELETS: 302 10*3/uL (ref 150.0–400.0)
RBC: 4.98 Mil/uL (ref 3.87–5.11)
RDW: 13.4 % (ref 11.5–15.5)
WBC: 11.1 10*3/uL — ABNORMAL HIGH (ref 4.0–10.5)

## 2014-08-22 LAB — T4, FREE: FREE T4: 0.72 ng/dL (ref 0.60–1.60)

## 2014-08-22 LAB — TSH: TSH: 1.43 u[IU]/mL (ref 0.35–4.50)

## 2014-08-22 NOTE — Progress Notes (Signed)
Subjective:    Patient ID: Mariah [name], female    DOB: 07/16/1985, 29 y.o.   MRN: [id number]  HPI  Pt presents to the clinic today with c/o feeling sluggish. This started about 3 years ago. The sluggishness only occurs when she breaks out in hives. The hives have occurred intermittently over the last 3 years. She has been seeing Dr. Greenfield CallasSharma for allergy testing and is receiving scheduled allergy shots. He did do skin testing on her but did not test her blood. She also reports she was on Xolair at one point which did seem to help, but she reports she felt really bad while she was taking it and does not want to take it again. She denies any other associated symptoms. Antihistamines OTC do no make it better. She has tried steroid creams without relief. She can not think of anything that makes it worse.   Review of Systems      Past Medical History  Diagnosis Date  . Panic disorder without agoraphobia   . Anxiety state, unspecified   . Allergy   . Asthma     Current Outpatient Prescriptions  Medication Sig Dispense Refill  . albuterol (PROVENTIL HFA;VENTOLIN HFA) 108 (90 BASE) MCG/ACT inhaler Inhale 1-2 puffs into the lungs every 6 (six) hours as needed. For wheezing or chest tightness 1 Inhaler 0  . drospirenone-ethinyl estradiol (YAZ,GIANVI,LORYNA) 3-0.02 MG tablet Take 1 tablet by mouth daily.    . montelukast (SINGULAIR) 10 MG tablet Take 1 tablet (10 mg total) by mouth at bedtime. 30 tablet 5   No current facility-administered medications for this visit.    Allergies  Allergen Reactions  . Codeine Nausea Only and Other (See Comments)    Passes out  . Vicodin [Hydrocodone-Acetaminophen] Nausea Only and Other (See Comments)    Passes out    Family History  Problem Relation Age of Onset  . Hypertension Father   . Depression Mother   . Diabetes    . Cancer    . Stroke      History   Social History  . Marital Status: Single    Spouse Name: N/A  . Number of Children: 0   . Years of Education: N/A   Occupational History  . Pharmacy tech     [employer]   .          Social History Main Topics  . Smoking status: Former Games developermoker  . Smokeless tobacco: Never Used  . Alcohol Use: 0.0 oz/week    0 Standard drinks or equivalent per week     Comment: very rarely  . Drug Use: No  . Sexual Activity: Not on file   Other Topics Concern  . Not on file   Social History Narrative               Constitutional: Pt reports fatigue. Denies fever, malaise, headache or abrupt weight changes.  HEENT: Denies eye pain, eye redness, ear pain, ringing in the ears, wax buildup, runny nose, nasal congestion, bloody nose, or sore throat. Respiratory: Denies difficulty breathing, shortness of breath, cough or sputum production.   Cardiovascular: Denies chest pain, chest tightness, palpitations or swelling in the hands or feet.  Skin: Pt reports hives. Denies redness, or ulcercations.   No other specific complaints in a complete review of systems (except as listed in HPI above).  Objective:   Physical Exam   BP 98/66 mmHg  Pulse 78  Temp(Src) 98.5 F (36.9 C) (Oral)  Wt 118 lb (53.524 kg)  SpO2 98% Wt Readings from Last 3 Encounters:  08/22/14 118 lb (53.524 kg)  06/22/13 126 lb (57.153 kg)  10/15/12 122 lb 3.2 oz (55.43 kg)    General: Appears her stated age, well developed, well nourished in NAD. Skin: Warm, dry and intact. No rashes, lesions or ulcerations noted. Neck:  Neck supple, trachea midline. No masses, lumps or thyromegaly present.  Cardiovascular:  S1,S2 noted.  No murmur, rubs or gallops noted.  Pulmonary/Chest: Normal effort and positive vesicular breath sounds. No respiratory distress. No wheezes, rales or ronchi noted.  Neurological: Alert and oriented.   BMET    Component Value Date/Time   NA 137 05/13/2011 0931   K 3.7 05/13/2011 0931   CL 106 05/13/2011 0931   CO2 24 05/13/2011 0931   GLUCOSE 88 05/13/2011 0931   BUN 11 05/13/2011  0931   CREATININE 0.7 05/13/2011 0931   CALCIUM 8.2* 05/13/2011 0931   GFRNONAA 111.09 02/08/2009 1257   GFRAA 137 02/05/2007 1523    Lipid Panel     Component Value Date/Time   CHOL 158 05/13/2011 0931   TRIG 141.0 05/13/2011 0931   HDL 64.00 05/13/2011 0931   CHOLHDL 2 05/13/2011 0931   VLDL 28.2 05/13/2011 0931   LDLCALC 66 05/13/2011 0931    CBC    Component Value Date/Time   WBC 9.8 02/08/2009 1257   RBC 4.73 02/08/2009 1257   HGB 13.4 02/08/2009 1257   HCT 39.3 02/08/2009 1257   PLT 317.0 02/08/2009 1257   MCV 83.2 02/08/2009 1257   MCHC 34.0 02/08/2009 1257   RDW 12.5 02/08/2009 1257   LYMPHSABS 2.1 02/08/2009 1257   MONOABS 0.6 02/08/2009 1257   EOSABS 1.6* 02/08/2009 1257   BASOSABS 0.0 02/08/2009 1257    Hgb A1C No results found for: HGBA1C      Assessment & Plan:   Hives, fatigue:  Has been seeing a specialist but reports no answers have been found Will check CBC, CMET, TSH and Free T4 Continue Singulair for now  Advised her to follow up with PCP in 2 weeks

## 2014-08-22 NOTE — Patient Instructions (Signed)
Hives Hives are itchy, red, swollen areas of the skin. They can vary in size and location on your body. Hives can come and go for hours or several days (acute hives) or for several weeks (chronic hives). Hives do not spread from person to person (noncontagious). They may get worse with scratching, exercise, and emotional stress. CAUSES   Allergic reaction to food, additives, or drugs.  Infections, including the common cold.  Illness, such as vasculitis, lupus, or thyroid disease.  Exposure to sunlight, heat, or cold.  Exercise.  Stress.  Contact with chemicals. SYMPTOMS   Red or white swollen patches on the skin. The patches may change size, shape, and location quickly and repeatedly.  Itching.  Swelling of the hands, feet, and face. This may occur if hives develop deeper in the skin. DIAGNOSIS  Your caregiver can usually tell what is wrong by performing a physical exam. Skin or blood tests may also be done to determine the cause of your hives. In some cases, the cause cannot be determined. TREATMENT  Mild cases usually get better with medicines such as antihistamines. Severe cases may require an emergency epinephrine injection. If the cause of your hives is known, treatment includes avoiding that trigger.  HOME CARE INSTRUCTIONS   Avoid causes that trigger your hives.  Take antihistamines as directed by your caregiver to reduce the severity of your hives. Non-sedating or low-sedating antihistamines are usually recommended. Do not drive while taking an antihistamine.  Take any other medicines prescribed for itching as directed by your caregiver.  Wear loose-fitting clothing.  Keep all follow-up appointments as directed by your caregiver. SEEK MEDICAL CARE IF:   You have persistent or severe itching that is not relieved with medicine.  You have painful or swollen joints. SEEK IMMEDIATE MEDICAL CARE IF:   You have a fever.  Your tongue or lips are swollen.  You have  trouble breathing or swallowing.  You feel tightness in the throat or chest.  You have abdominal pain. These problems may be the first sign of a life-threatening allergic reaction. Call your local emergency services (911 in [place] MAKE SURE YOU:   Understand these instructions.  Will watch your condition.  Will get help right away if you are not doing well or get worse. Document Released: 06/10/2005 Document Revised: 06/15/2013 Document Reviewed: 09/03/2011 Exit[employer] Patient Information 2015 [employer]. This information is not intended to replace advice given to you by your health care provider. Make sure you discuss any questions you have with your health care provider.  

## 2014-08-22 NOTE — Progress Notes (Signed)
Pre visit review using our clinic review tool, if applicable. No additional management support is needed unless otherwise documented below in the visit note. 

## 2014-08-22 NOTE — Progress Notes (Signed)
   Subjective:    Patient ID: Mariah [name], female    DOB: 05/21/1986, 29 y.o.   MRN: [id number]  HPI Mariah [name] is a 29 year old female who presents today with chief complaint of hives and feeling sluggish.  She has had this issue on and off for 3 years.  She notices the hives first appear on her hands and then will spread to her arms and legs, never to her trunk.  When the hives appear, the patient begins to feel sluggish and "hungover".  She has seen an allergist in the past for this and allergy testing was preformed.  She was also prescribed steroid cream for the hives without relief.  She has also tried Xolair injections, these relieved the hives but she does not wish to continue the injections due to side effects.   She would like to get her thyroid function tested today.     Review of Systems  Constitutional: Positive for fatigue. Negative for fever and chills.  HENT: Negative.   Respiratory: Negative for cough, shortness of breath and wheezing.   Cardiovascular: Negative for chest pain, palpitations and leg swelling.  Endocrine: Negative for cold intolerance and heat intolerance.  Genitourinary: Negative.   Musculoskeletal: Negative for back pain, joint swelling and neck pain.  Skin: Positive for rash (intermittent). Negative for color change and pallor.   Past Medical History  Diagnosis Date  . Panic disorder without agoraphobia   . Anxiety state, unspecified   . Allergy   . Asthma    Family History  Problem Relation Age of Onset  . Hypertension Father   . Depression Mother   . Diabetes    . Cancer    . Stroke     Current Outpatient Prescriptions on File Prior to Visit  Medication Sig Dispense Refill  . albuterol (PROVENTIL HFA;VENTOLIN HFA) 108 (90 BASE) MCG/ACT inhaler Inhale 1-2 puffs into the lungs every 6 (six) hours as needed. For wheezing or chest tightness 1 Inhaler 0  . montelukast (SINGULAIR) 10 MG tablet Take 1 tablet (10 mg total) by mouth at bedtime. 30  tablet 5   No current facility-administered medications on file prior to visit.       Objective:   Physical Exam  Constitutional: She is oriented to person, place, and time. She appears well-developed and well-nourished.  HENT:  Head: Normocephalic and atraumatic.  Neck: Normal range of motion. Neck supple. No thyromegaly present.  Cardiovascular: Normal rate, regular rhythm and normal heart sounds.   Pulmonary/Chest: Effort normal and breath sounds normal.  Musculoskeletal: Normal range of motion.  Lymphadenopathy:    She has no cervical adenopathy.  Neurological: She is alert and oriented to person, place, and time.  Skin: Skin is warm and dry. No rash noted.  Psychiatric: She has a normal mood and affect.   BP 98/66 mmHg  Pulse 78  Temp(Src) 98.5 F (36.9 C) (Oral)  Wt 118 lb (53.524 kg)  SpO2 98%        Assessment & Plan:  Will check CBC with diff, Cmet, TSH, t3, and t4.  Advised patient to make a follow up appointment with Dr. Dayton MartesAron, her PCP.

## 2014-08-26 ENCOUNTER — Telehealth: Payer: Self-pay | Admitting: Family Medicine

## 2014-08-26 NOTE — Telephone Encounter (Signed)
Pt is requesting referral to Dr Cliffton AstersJohn [name] (internist). Pt is Dr Mariah [name]'s pt, but [facility] Please call pt at [phone] when completed. Thanks.

## 2014-08-26 NOTE — Telephone Encounter (Signed)
Pt returned your call. Please call back at [phone]

## 2014-08-26 NOTE — Telephone Encounter (Signed)
Pt returned your call [phone]

## 2014-08-26 NOTE — Telephone Encounter (Signed)
Tele# provided in previous message is non working. Lm on pts original provided number

## 2014-08-26 NOTE — Telephone Encounter (Signed)
Lm on pts vm requesting a call back 

## 2015-02-25 DIAGNOSIS — L508 Other urticaria: Secondary | ICD-10-CM

## 2015-02-25 DIAGNOSIS — J4520 Mild intermittent asthma, uncomplicated: Secondary | ICD-10-CM | POA: Insufficient documentation

## 2015-03-03 ENCOUNTER — Other Ambulatory Visit: Payer: Self-pay

## 2015-03-03 MED ORDER — OMALIZUMAB 150 MG ~~LOC~~ SOLR
300.0000 mg | SUBCUTANEOUS | Status: DC
  Administered 2015-03-28 – 2015-06-27 (×4): 300 mg via SUBCUTANEOUS

## 2015-03-23 ENCOUNTER — Other Ambulatory Visit: Payer: Self-pay | Admitting: Allergy and Immunology

## 2015-03-23 DIAGNOSIS — J4520 Mild intermittent asthma, uncomplicated: Secondary | ICD-10-CM

## 2015-03-23 MED ORDER — ALBUTEROL SULFATE HFA 108 (90 BASE) MCG/ACT IN AERS: 1.0000 | INHALATION_SPRAY | RESPIRATORY_TRACT | Status: DC | PRN

## 2015-03-27 NOTE — Progress Notes (Signed)
This encounter was created in error - please disregard.  This encounter was created in error - please disregard.

## 2015-03-28 ENCOUNTER — Ambulatory Visit (INDEPENDENT_AMBULATORY_CARE_PROVIDER_SITE_OTHER): Payer: BLUE CROSS/BLUE SHIELD | Admitting: *Deleted

## 2015-03-28 DIAGNOSIS — L508 Other urticaria: Secondary | ICD-10-CM

## 2015-03-28 DIAGNOSIS — L501 Idiopathic urticaria: Secondary | ICD-10-CM

## 2015-04-25 ENCOUNTER — Other Ambulatory Visit: Payer: Self-pay | Admitting: *Deleted

## 2015-04-25 ENCOUNTER — Ambulatory Visit (INDEPENDENT_AMBULATORY_CARE_PROVIDER_SITE_OTHER): Payer: BLUE CROSS/BLUE SHIELD | Admitting: *Deleted

## 2015-04-25 DIAGNOSIS — L501 Idiopathic urticaria: Secondary | ICD-10-CM

## 2015-04-25 DIAGNOSIS — L508 Other urticaria: Secondary | ICD-10-CM

## 2015-04-25 MED ORDER — MONTELUKAST SODIUM 10 MG PO TABS: 10.0000 mg | ORAL_TABLET | Freq: Every day | ORAL | Status: DC

## 2015-05-02 ENCOUNTER — Ambulatory Visit (INDEPENDENT_AMBULATORY_CARE_PROVIDER_SITE_OTHER): Payer: BLUE CROSS/BLUE SHIELD | Admitting: Nurse Practitioner

## 2015-05-02 ENCOUNTER — Encounter: Payer: Self-pay | Admitting: Nurse Practitioner

## 2015-05-02 VITALS — BP 130/78 | HR 88 | Temp 98.9°F | Resp 16 | Wt 119.0 lb

## 2015-05-02 DIAGNOSIS — Z7189 Other specified counseling: Secondary | ICD-10-CM | POA: Diagnosis not present

## 2015-05-02 DIAGNOSIS — L508 Other urticaria: Secondary | ICD-10-CM

## 2015-05-02 DIAGNOSIS — J4521 Mild intermittent asthma with (acute) exacerbation: Secondary | ICD-10-CM | POA: Diagnosis not present

## 2015-05-02 DIAGNOSIS — F411 Generalized anxiety disorder: Secondary | ICD-10-CM

## 2015-05-02 DIAGNOSIS — T753XXA Motion sickness, initial encounter: Secondary | ICD-10-CM

## 2015-05-02 DIAGNOSIS — Z7689 Persons encountering health services in other specified circumstances: Secondary | ICD-10-CM

## 2015-05-02 NOTE — Progress Notes (Signed)
Patient ID: Mariah [name], female    DOB: 03/16/1986  Age: 29 y.o. MRN: [id number]  CC: New Patient (Initial Visit)   HPI Mariah [name] presents for establish care today and CC of nausea medication for a cruise.   1) New pt info:   Immunizations- Unknown tdap, flu given at her work  Mammogram-  Pap- 2015, keeping OB/GYN, has Gianvi for OCP, wants to become pregnant in the next year or so.   Eye Exam- UTD   Dental Exam- UTD  2) Chronic Problems-  Urticaria- Idiopathic. Pt takes Xolair shot once a month.   Testing for HIV- negative 2 years ago  Asthma- Singular and Proair inhaler as needed   Fainting Spells- 3 years ago no known etiology   3) Acute Problems-  Cruise- leaving for a cruise next year. She would like to know what to try or if I can call in something when the time comes. She denies trying anything in the past and is interested in something oral vs. The patch. We discussed options, how to take, ect... She is willing to try dramamine, but may have scope patch on hand for back up.   History Lawson FiscalLori has a past medical history of Panic disorder without agoraphobia; Anxiety state, unspecified; Allergy; and Asthma.   She has past surgical history that includes Tonsillectomy.   Her family history includes Cancer in an other family member; Depression in her mother; Diabetes in an other family member; Hypertension in her father; Stroke in an other family member.She reports that she has never smoked. She has never used smokeless tobacco. She reports that she drinks alcohol. She reports that she does not use illicit drugs.  Outpatient Prescriptions Prior to Visit  Medication Sig Dispense Refill  . albuterol (PROVENTIL HFA;VENTOLIN HFA) 108 (90 BASE) MCG/ACT inhaler Inhale 1-2 puffs into the lungs every 4 (four) hours as needed. For wheezing or chest tightness 1 Inhaler 0  . drospirenone-ethinyl estradiol (YAZ,GIANVI,LORYNA) 3-0.02 MG tablet Take 1 tablet by mouth daily.    . fexofenadine  (ALLEGRA) 180 MG tablet Take 180 mg by mouth daily.    . montelukast (SINGULAIR) 10 MG tablet Take 1 tablet (10 mg total) by mouth at bedtime. 30 tablet 0   Facility-Administered Medications Prior to Visit  Medication Dose Route Frequency Provider Last Rate Last Dose  . omalizumab Geoffry Paradise(XOLAIR) injection 300 mg  300 mg Subcutaneous Q28 days Jessica PriestEric J Kozlow, MD   300 mg at 04/25/15 0945    ROS Review of Systems  Constitutional: Negative for fever, chills, diaphoresis and fatigue.  Respiratory: Negative for chest tightness, shortness of breath and wheezing.   Cardiovascular: Negative for chest pain, palpitations and leg swelling.  Gastrointestinal: Negative for nausea, vomiting and diarrhea.  Skin: Negative for rash.  Neurological: Negative for dizziness, weakness, numbness and headaches.  Psychiatric/Behavioral: The patient is not nervous/anxious.    Objective:  BP 130/78 mmHg  Pulse 88  Temp(Src) 98.9 F (37.2 C)  Resp 16  Wt 119 lb (53.978 kg)  SpO2 99%  Physical Exam  Constitutional: She is oriented to person, place, and time. She appears well-developed and well-nourished. No distress.  HENT:  Head: Normocephalic and atraumatic.  Right Ear: External ear normal.  Left Ear: External ear normal.  Cardiovascular: Normal rate, regular rhythm and normal heart sounds.  Exam reveals no gallop and no friction rub.   No murmur heard. Pulmonary/Chest: Effort normal and breath sounds normal. No respiratory distress. She has no wheezes. She has  no rales. She exhibits no tenderness.  Neurological: She is alert and oriented to person, place, and time. No cranial nerve deficit. She exhibits normal muscle tone. Coordination normal.  Skin: Skin is warm and dry. No rash noted. She is not diaphoretic.  Psychiatric: She has a normal mood and affect. Her behavior is normal. Judgment and thought content normal.   Assessment & Plan:   There are no diagnoses linked to this encounter. I am having Ms.  [name] maintain her drospirenone-ethinyl estradiol, fexofenadine, albuterol, and montelukast. We will continue to administer omalizumab.  No orders of the defined types were placed in this encounter.     Follow-up: Return in about 3 months (around 07/26/2015) for CPE with labs .

## 2015-05-02 NOTE — Progress Notes (Signed)
Pre visit review using our clinic review tool, if applicable. No additional management support is needed unless otherwise documented below in the visit note. 

## 2015-05-02 NOTE — Patient Instructions (Addendum)
Nice to meet you and Welcome to [facility] [place] [facility]!   Annual exam with labs in Feb. (fasting labs- nothing to eat or drink after midnight)

## 2015-05-04 DIAGNOSIS — T753XXA Motion sickness, initial encounter: Secondary | ICD-10-CM | POA: Insufficient documentation

## 2015-05-04 DIAGNOSIS — Z7689 Persons encountering health services in other specified circumstances: Secondary | ICD-10-CM | POA: Insufficient documentation

## 2015-05-04 NOTE — Assessment & Plan Note (Signed)
Pt going on cruise and concerned about motion sickness. Discussed options and usage of each. She is willing to try dramamine and/or scopolamine patch. I stated I would call in scope patch closer to time so she can have this and use dramamine as a back up.

## 2015-05-04 NOTE — Assessment & Plan Note (Signed)
Stable currently on singular and Proair HFA as needed. Will follow

## 2015-05-04 NOTE — Assessment & Plan Note (Signed)
Stable currently. No concerns today.

## 2015-05-04 NOTE — Assessment & Plan Note (Signed)
Discussed acute and chronic issues. Reviewed health maintenance measures, PFSHx, and immunizations. Obtain records from previous facility.   

## 2015-05-04 NOTE — Assessment & Plan Note (Signed)
Helped by Xolair injections once monthly.

## 2015-05-30 ENCOUNTER — Encounter: Payer: Self-pay | Admitting: Allergy and Immunology

## 2015-05-30 ENCOUNTER — Encounter (INDEPENDENT_AMBULATORY_CARE_PROVIDER_SITE_OTHER): Payer: Self-pay

## 2015-05-30 ENCOUNTER — Ambulatory Visit (INDEPENDENT_AMBULATORY_CARE_PROVIDER_SITE_OTHER): Payer: BLUE CROSS/BLUE SHIELD | Admitting: Allergy and Immunology

## 2015-05-30 ENCOUNTER — Ambulatory Visit (INDEPENDENT_AMBULATORY_CARE_PROVIDER_SITE_OTHER): Payer: BLUE CROSS/BLUE SHIELD

## 2015-05-30 VITALS — BP 110/78 | HR 68 | Resp 20 | Ht 60.43 in | Wt 118.6 lb

## 2015-05-30 DIAGNOSIS — J4540 Moderate persistent asthma, uncomplicated: Secondary | ICD-10-CM | POA: Diagnosis not present

## 2015-05-30 DIAGNOSIS — J4520 Mild intermittent asthma, uncomplicated: Secondary | ICD-10-CM | POA: Diagnosis not present

## 2015-05-30 DIAGNOSIS — L500 Allergic urticaria: Secondary | ICD-10-CM | POA: Diagnosis not present

## 2015-05-30 DIAGNOSIS — H1010 Acute atopic conjunctivitis, unspecified eye: Secondary | ICD-10-CM | POA: Diagnosis not present

## 2015-05-30 DIAGNOSIS — J309 Allergic rhinitis, unspecified: Secondary | ICD-10-CM

## 2015-05-30 NOTE — Progress Notes (Signed)
[name] [place]  Follow-up Note  Refering Provider: Carollee Leitzoss, Carrie M, NP Primary Provider: Carollee Leitzoss, Carrie M, NP  Subjective:   Mariah FabianLori M [name] is a 29 y.o. female who returns to the [name] in re-evaluation of the following:  HPI Comments:  Mariah [name] returns to this clinic on 05/30/2015 in reevaluation of her atopic disease. She is receiving 300 mg of Xolair every month to treat her chronic urticaria with very good control. Her asthma is been under excellent control and she has a minimal requirement for bronchodilator and has not required any systemic steroids to treat her asthma over the course of the past year. She can apparently exercise without too much problem other she does notice that she gets out of breath if she exerts helped any large degree. She does use a short acting bronchodilator prior to exercise. He's had very little problems with her nose while using an antihistamine and montelukast. She did obtain the flu vaccine this year.   Outpatient Encounter Prescriptions as of 05/30/2015  Medication Sig  . albuterol (PROAIR HFA) 108 (90 BASE) MCG/ACT inhaler Inhale two puffs every four to six hours as needed for cough or wheeze.  . drospirenone-ethinyl estradiol (YAZ,GIANVI,LORYNA) 3-0.02 MG tablet Take 1 tablet by mouth daily.  Marland Kitchen. EPINEPHrine (EPIPEN 2-PAK) 0.3 mg/0.3 mL IJ SOAJ injection Use as directed for life-threatening allergic reactions.  . fexofenadine (ALLEGRA) 180 MG tablet Take 180 mg by mouth daily.  . montelukast (SINGULAIR) 10 MG tablet Take 1 tablet (10 mg total) by mouth at bedtime.  . [DISCONTINUED] albuterol (PROVENTIL HFA;VENTOLIN HFA) 108 (90 BASE) MCG/ACT inhaler Inhale 1-2 puffs into the lungs every 4 (four) hours as needed. For wheezing or chest tightness   Facility-Administered Encounter Medications as of 05/30/2015  Medication  . omalizumab Geoffry Paradise(XOLAIR) injection 300 mg    No orders of the defined  types were placed in this encounter.    Past Medical History  Diagnosis Date  . Panic disorder without agoraphobia   . Anxiety state, unspecified   . Allergy   . Asthma   . Urticaria     Past Surgical History  Procedure Laterality Date  . Tonsillectomy      01/26/07- Dr. Jenne PaneBates    Allergies  Allergen Reactions  . Codeine Nausea Only and Other (See Comments)    Passes out  . Prednisone Nausea Only and Other (See Comments)    DIZZINESS.   . Vicodin [Hydrocodone-Acetaminophen] Nausea Only and Other (See Comments)    Passes out    Review of Systems  Constitutional: Negative.   HENT: Negative.   Eyes: Negative.   Respiratory: Negative.   Cardiovascular: Negative.   Gastrointestinal: Negative.   Musculoskeletal: Negative.   Skin: Negative.   Hematological: Negative.      Objective:   Filed Vitals:   05/30/15 1510  BP: 110/78  Pulse: 68  Resp: 20   Height: 5' 0.43" (153.5 cm)  Weight: 118 lb 9.7 oz (53.8 kg)   Physical Exam  Constitutional: She appears well-developed and well-nourished. No distress.  HENT:  Head: Normocephalic and atraumatic. Head is without right periorbital erythema and without left periorbital erythema.  Right Ear: Tympanic membrane, external ear and ear canal normal. No drainage or tenderness. No foreign bodies. Tympanic membrane is not injected, not scarred, not perforated, not erythematous, not retracted and not bulging. No middle ear effusion.  Left Ear: Tympanic membrane, external ear and ear canal normal. No  drainage or tenderness. No foreign bodies. Tympanic membrane is not injected, not scarred, not perforated, not erythematous, not retracted and not bulging.  No middle ear effusion.  Nose: Nose normal. No mucosal edema, rhinorrhea, nose lacerations or sinus tenderness.  No foreign bodies.  Mouth/Throat: Oropharynx is clear and moist. No oropharyngeal exudate, posterior oropharyngeal edema, posterior oropharyngeal erythema or tonsillar  abscesses.  Eyes: Lids are normal. Right eye exhibits no chemosis, no discharge and no exudate. No foreign body present in the right eye. Left eye exhibits no chemosis, no discharge and no exudate. No foreign body present in the left eye. Right conjunctiva is not injected. Left conjunctiva is not injected.  Neck: Neck supple. No tracheal tenderness present. No tracheal deviation and no edema present. No thyroid mass and no thyromegaly present.  Cardiovascular: Normal rate, regular rhythm, S1 normal and S2 normal.  Exam reveals no gallop.   No murmur heard. Pulmonary/Chest: No accessory muscle usage or stridor. No respiratory distress. She has no wheezes. She has no rhonchi. She has no rales.  Abdominal: Soft.  Lymphadenopathy:       Head (right side): No tonsillar adenopathy present.       Head (left side): No tonsillar adenopathy present.    She has no cervical adenopathy.  Neurological: She is alert.  Skin: No rash noted. She is not diaphoretic.  Psychiatric: She has a normal mood and affect. Her behavior is normal.    Diagnostics:    Spirometry was performed and demonstrated an FEV1 of 3.07 at 108 % of predicted.  The patient had an Asthma Control Test with the following results:  .    Assessment and Plan:   1. Allergic urticaria   2. Mild intermittent asthma, uncomplicated   3. Allergic rhinoconjunctivitis      1. Decrease Xolair to 150 mg monthly  2. Continue EpiPen and ProAir HFA if needed  3. Continue singular 10 mg daily and Allegra 180 one tablet daily  4. Return to clinic in 6 months or earlier if problem  We'll now have Saira attempt to use a lower dose of Xolair and hopefully still obtain the same amount of control regarding her chronic urticaria. She will contact me noting her response as we move forward over the course the next several months. Concerning her exercise-induced asthma, I made a recommendation that she undergo a more progressive aerobic exercise routine  as i suspect that some of her exercise related respiratory tract symptoms are probably an issue associated with deconditioning more so than asthma.   Laurette Schimke, MD [name]

## 2015-05-30 NOTE — Patient Instructions (Signed)
  1. Decrease Xolair to 150 mg monthly  2. Continue EpiPen and ProAir HFA if needed  3. Continue singular 10 mg daily and Allegra 180 one tablet daily  4. Return to clinic in 6 months or earlier if problem

## 2015-06-01 ENCOUNTER — Other Ambulatory Visit: Payer: Self-pay | Admitting: Neurology

## 2015-06-01 MED ORDER — MONTELUKAST SODIUM 10 MG PO TABS: 10.0000 mg | ORAL_TABLET | Freq: Every day | ORAL | Status: DC

## 2015-06-22 ENCOUNTER — Ambulatory Visit (INDEPENDENT_AMBULATORY_CARE_PROVIDER_SITE_OTHER): Payer: BLUE CROSS/BLUE SHIELD | Admitting: Nurse Practitioner

## 2015-06-22 ENCOUNTER — Encounter: Payer: Self-pay | Admitting: Nurse Practitioner

## 2015-06-22 VITALS — BP 110/70 | HR 98 | Temp 99.7°F | Wt 121.8 lb

## 2015-06-22 DIAGNOSIS — B9689 Other specified bacterial agents as the cause of diseases classified elsewhere: Secondary | ICD-10-CM

## 2015-06-22 DIAGNOSIS — J019 Acute sinusitis, unspecified: Secondary | ICD-10-CM | POA: Diagnosis not present

## 2015-06-22 MED ORDER — AMOXICILLIN-POT CLAVULANATE 875-125 MG PO TABS: 1.0000 | ORAL_TABLET | Freq: Two times a day (BID) | ORAL | Status: DC

## 2015-06-22 MED ORDER — HYDROCOD POLST-CPM POLST ER 10-8 MG/5ML PO SUER: 5.0000 mL | Freq: Every evening | ORAL | Status: DC | PRN

## 2015-06-22 MED ORDER — FLUTICASONE PROPIONATE 50 MCG/ACT NA SUSP: 2.0000 | Freq: Every day | NASAL | Status: DC

## 2015-06-22 NOTE — Patient Instructions (Signed)
Augmentin twice daily x 10 days   Please take a probiotic ( Align, Floraque or Culturelle) while you are on the antibiotic to prevent a serious antibiotic associated diarrhea  Called clostirudium dificile colitis and a vaginal yeast infection.   Tussionex- 5 mL (1 teaspoon) of cough syrup. Don't drive or make important decisions while taking this....just sleep and rest.   Flonase for helping with decreasing inflammation of face and ears

## 2015-06-22 NOTE — Progress Notes (Signed)
Patient ID: Mariah [name], female    DOB: [birth year]  Age: 29 y.o. MRN: [id number]  CC: Cough   HPI Mariah [name] presents for CC of cough x 6 days.   1) Cough, ST, Chest, Head congestion Right side of face worse  Dark green Not sleeping well Tmax 100.8  Body aches and chills   Treatment to date:   Mucinex every 12 hrs  Sudafed every 4 hrs  Ibuprofen   Sick contacts: Customers sick at work   Felt improved Monday worsening again   History Mariah [name] has a past medical history of Panic disorder without agoraphobia; Anxiety state, unspecified; Allergy; Asthma; and Urticaria.   Mariah [name] has past surgical history that includes Tonsillectomy.   Mariah [name] family history includes Depression in Mariah [name] mother; Hypertension in Mariah [name] father.Mariah [name] reports that Mariah [name] has never smoked. Mariah [name] has never used smokeless tobacco. Mariah [name] reports that Mariah [name] drinks alcohol. Mariah [name] reports that Mariah [name] does not use illicit drugs.  Outpatient Prescriptions Prior to Visit  Medication Sig Dispense Refill  . albuterol (PROAIR HFA) 108 (90 BASE) MCG/ACT inhaler Inhale two puffs every four to six hours as needed for cough or wheeze.    . drospirenone-ethinyl estradiol (YAZ,GIANVI,LORYNA) 3-0.02 MG tablet Take 1 tablet by mouth daily.    Marland Kitchen EPINEPHrine (EPIPEN 2-PAK) 0.3 mg/0.3 mL IJ SOAJ injection Use as directed for life-threatening allergic reactions.    . fexofenadine (ALLEGRA) 180 MG tablet Take 180 mg by mouth daily.    . montelukast (SINGULAIR) 10 MG tablet Take 1 tablet (10 mg total) by mouth at bedtime. 30 tablet 3   Facility-Administered Medications Prior to Visit  Medication Dose Route Frequency Provider Last Rate Last Dose  . omalizumab Geoffry Paradise) injection 300 mg  300 mg Subcutaneous Q28 days Jessica Priest, MD   300 mg at 05/30/15 1502    ROS Review of Systems  Constitutional: Positive for fever, chills and fatigue. Negative for diaphoresis.  HENT: Positive for congestion, ear pain, facial swelling, postnasal drip, rhinorrhea, sinus  pressure, sneezing and sore throat. Negative for trouble swallowing and voice change.   Eyes: Negative for visual disturbance.  Respiratory: Positive for cough. Negative for chest tightness, shortness of breath and wheezing.   Cardiovascular: Negative for chest pain, palpitations and leg swelling.  Gastrointestinal: Negative for nausea, vomiting and diarrhea.  Musculoskeletal: Positive for myalgias. Negative for neck pain and neck stiffness.  Skin: Negative for rash.  Neurological: Positive for headaches. Negative for dizziness, weakness and numbness.  Psychiatric/Behavioral: The patient is not nervous/anxious.     Objective:  BP 110/70 mmHg  Pulse 98  Temp(Src) 99.7 F (37.6 C) (Oral)  Wt 121 lb 12.8 oz (55.248 kg)  SpO2 98%  Physical Exam  Constitutional: Mariah [name] is oriented to person, place, and time. Mariah [name] appears well-developed and well-nourished. No distress.  HENT:  Head: Normocephalic and atraumatic.  Right Ear: External ear normal.  Left Ear: External ear normal.  Mouth/Throat: No oropharyngeal exudate.  Oropharynx and nasal mucosa are edematous TMs clear but slightly retracted  Eyes: EOM are normal. Pupils are equal, round, and reactive to light. Right eye exhibits no discharge. Left eye exhibits no discharge. No scleral icterus.  Neck: Normal range of motion. Neck supple.  Cardiovascular: Normal rate, regular rhythm and normal heart sounds.  Exam reveals no gallop and no friction rub.   No murmur heard. Pulmonary/Chest: Effort normal and breath sounds normal. No respiratory distress. Mariah [name] has no wheezes. Mariah [name] has no rales. Mariah [name] exhibits no tenderness.  Lymphadenopathy:    Mariah [name] has cervical adenopathy.  Neurological: Mariah [name] is alert and oriented to person, place, and time. No cranial nerve deficit. Mariah [name] exhibits normal muscle tone. Coordination normal.  Skin: Skin is warm and dry. No rash noted. Mariah [name] is not diaphoretic.  Psychiatric: Mariah [name] has a normal mood and affect. Mariah [name] behavior is  normal. Judgment and thought content normal.   Assessment & Plan:   Mariah [name] was seen today for cough.  Diagnoses and all orders for this visit:  Acute bacterial rhinosinusitis  Other orders -     fluticasone (FLONASE) 50 MCG/ACT nasal spray; Place 2 sprays into both nostrils daily. -     amoxicillin-clavulanate (AUGMENTIN) 875-125 MG tablet; Take 1 tablet by mouth 2 (two) times daily. -     chlorpheniramine-HYDROcodone (TUSSIONEX PENNKINETIC ER) 10-8 MG/5ML SUER; Take 5 mLs by mouth at bedtime as needed for cough.  I am having Mariah [name] start on fluticasone, amoxicillin-clavulanate, and chlorpheniramine-HYDROcodone. I am also having Mariah [name] maintain Mariah [name] drospirenone-ethinyl estradiol, fexofenadine, albuterol, EPINEPHrine, and montelukast. We will continue to administer omalizumab.  Meds ordered this encounter  Medications  . fluticasone (FLONASE) 50 MCG/ACT nasal spray    Sig: Place 2 sprays into both nostrils daily.    Dispense:  16 g    Refill:  1    Order Specific Question:  Supervising Provider    Answer:  Darrick HuntsmanULLO, TERESA L [[id number]]  . amoxicillin-clavulanate (AUGMENTIN) 875-125 MG tablet    Sig: Take 1 tablet by mouth 2 (two) times daily.    Dispense:  20 tablet    Refill:  0    Order Specific Question:  Supervising Provider    Answer:  Duncan DullULLO, TERESA L [[id number]]  . chlorpheniramine-HYDROcodone (TUSSIONEX PENNKINETIC ER) 10-8 MG/5ML SUER    Sig: Take 5 mLs by mouth at bedtime as needed for cough.    Dispense:  115 mL    Refill:  0    Order Specific Question:  Supervising Provider    Answer:  Sherlene ShamsULLO, TERESA L [[id number]]     Follow-up: Return if symptoms worsen or fail to improve.

## 2015-06-22 NOTE — Progress Notes (Signed)
Pre visit review using our clinic review tool, if applicable. No additional management support is needed unless otherwise documented below in the visit note. 

## 2015-06-26 DIAGNOSIS — B9689 Other specified bacterial agents as the cause of diseases classified elsewhere: Secondary | ICD-10-CM | POA: Insufficient documentation

## 2015-06-26 DIAGNOSIS — J019 Acute sinusitis, unspecified: Principal | ICD-10-CM

## 2015-06-26 NOTE — Assessment & Plan Note (Addendum)
New onset Patient looks fairly miserable today We'll treat with Augmentin, Tussionex, continue use of asthma medications Patient was advised on treatment regimen including cautions about drowsiness with test next and how to use. Encourage probiotic use Encouraged Flonase use (also sent to pharmacy) to decrease pressure Follow-up if failure to improve after treatment

## 2015-06-27 ENCOUNTER — Ambulatory Visit (INDEPENDENT_AMBULATORY_CARE_PROVIDER_SITE_OTHER): Payer: BLUE CROSS/BLUE SHIELD

## 2015-06-27 DIAGNOSIS — J4540 Moderate persistent asthma, uncomplicated: Secondary | ICD-10-CM | POA: Diagnosis not present

## 2015-06-30 ENCOUNTER — Other Ambulatory Visit: Payer: Self-pay | Admitting: *Deleted

## 2015-06-30 DIAGNOSIS — L501 Idiopathic urticaria: Secondary | ICD-10-CM

## 2015-06-30 MED ORDER — OMALIZUMAB 150 MG ~~LOC~~ SOLR
150.0000 mg | SUBCUTANEOUS | Status: DC
  Administered 2015-07-25 – 2017-03-18 (×22): 150 mg via SUBCUTANEOUS

## 2015-07-06 ENCOUNTER — Telehealth: Payer: Self-pay | Admitting: Nurse Practitioner

## 2015-07-06 NOTE — Telephone Encounter (Signed)
Last labs were in 2/15?  Please advise and order if necessary.  thanks

## 2015-07-06 NOTE — Telephone Encounter (Signed)
Pt called she needs order so she can come in for her lab work. Call pt @ [phone] Please and thank you!

## 2015-07-07 NOTE — Telephone Encounter (Signed)
Please make her a lab appointment I will put in orders. Thank you!

## 2015-07-07 NOTE — Telephone Encounter (Signed)
Ok. I called pt and left a vm to call office to sch lab appt.

## 2015-07-14 ENCOUNTER — Other Ambulatory Visit: Payer: Self-pay

## 2015-07-14 MED ORDER — SCOPOLAMINE 1 MG/3DAYS TD PT72: 1.0000 | MEDICATED_PATCH | TRANSDERMAL | Status: DC

## 2015-07-14 NOTE — Telephone Encounter (Signed)
Please advise, patient is going on a cruise.  Has an upcoming appt on 2/9.

## 2015-07-18 ENCOUNTER — Telehealth: Payer: Self-pay

## 2015-07-18 NOTE — Telephone Encounter (Signed)
Patient faxed request for Zofran and transderm patches for her upcoming cruise.  We filled the patches last week.  Please advise if you want to give her zofran and if so, dose and amount?

## 2015-07-19 ENCOUNTER — Other Ambulatory Visit: Payer: Self-pay | Admitting: Nurse Practitioner

## 2015-07-19 MED ORDER — ONDANSETRON HCL 4 MG PO TABS: 4.0000 mg | ORAL_TABLET | Freq: Three times a day (TID) | ORAL | Status: DC | PRN

## 2015-07-19 NOTE — Telephone Encounter (Signed)
I have sent in the zofran. Thank you!

## 2015-07-25 ENCOUNTER — Ambulatory Visit (INDEPENDENT_AMBULATORY_CARE_PROVIDER_SITE_OTHER): Payer: BLUE CROSS/BLUE SHIELD

## 2015-07-25 DIAGNOSIS — L501 Idiopathic urticaria: Secondary | ICD-10-CM

## 2015-07-25 DIAGNOSIS — J4550 Severe persistent asthma, uncomplicated: Secondary | ICD-10-CM

## 2015-08-01 ENCOUNTER — Encounter: Payer: BLUE CROSS/BLUE SHIELD | Admitting: Nurse Practitioner

## 2015-08-01 ENCOUNTER — Other Ambulatory Visit: Payer: BLUE CROSS/BLUE SHIELD

## 2015-08-01 NOTE — Telephone Encounter (Signed)
Labs and dx?  

## 2015-08-02 ENCOUNTER — Other Ambulatory Visit: Payer: Self-pay | Admitting: Nurse Practitioner

## 2015-08-02 ENCOUNTER — Telehealth: Payer: Self-pay | Admitting: *Deleted

## 2015-08-02 DIAGNOSIS — D72829 Elevated white blood cell count, unspecified: Secondary | ICD-10-CM

## 2015-08-02 NOTE — Telephone Encounter (Signed)
Orders placed.

## 2015-08-02 NOTE — Telephone Encounter (Signed)
Just cbc w/ diff- leukocytosis diagnosis. Thanks- sorry!

## 2015-08-03 ENCOUNTER — Ambulatory Visit (INDEPENDENT_AMBULATORY_CARE_PROVIDER_SITE_OTHER): Payer: BLUE CROSS/BLUE SHIELD | Admitting: Nurse Practitioner

## 2015-08-03 ENCOUNTER — Encounter: Payer: Self-pay | Admitting: Nurse Practitioner

## 2015-08-03 VITALS — BP 102/66 | HR 97 | Temp 97.4°F | Resp 12 | Ht 60.0 in | Wt 123.0 lb

## 2015-08-03 DIAGNOSIS — Z0000 Encounter for general adult medical examination without abnormal findings: Secondary | ICD-10-CM | POA: Diagnosis not present

## 2015-08-03 MED ORDER — SCOPOLAMINE 1 MG/3DAYS TD PT72: 1.0000 | MEDICATED_PATCH | TRANSDERMAL | Status: DC

## 2015-08-03 MED ORDER — ONDANSETRON HCL 4 MG PO TABS: 4.0000 mg | ORAL_TABLET | Freq: Three times a day (TID) | ORAL | Status: DC | PRN

## 2015-08-03 NOTE — Progress Notes (Signed)
Patient ID: Mariah [name], female    DOB: 05-18-1986  Age: 30 y.o. MRN: [id number]  CC: CPE   HPI JAKAI ONOFRE presents for annual physical.  1) Annual Physical   Diet- No changes  Exercise- No changes   Immunizations-    Flu- UTD   Tdap- okay with receiving   Eye Exam- UTD  Dental Exam- UTD  LMP- Continuous   Labs- Early morning. Tuesday fasting   Depression- Neg.  Refills: Zofran and transderm patch Xolair- down to 150 mg    History Lativia has a past medical history of Panic disorder without agoraphobia; Anxiety state, unspecified; Allergy; Asthma; and Urticaria.   She has past surgical history that includes Tonsillectomy.   Her family history includes Depression in her mother; Hypertension in her father.She reports that she has never smoked. She has never used smokeless tobacco. She reports that she drinks alcohol. She reports that she does not use illicit drugs.  Outpatient Prescriptions Prior to Visit  Medication Sig Dispense Refill  . albuterol (PROAIR HFA) 108 (90 BASE) MCG/ACT inhaler Inhale two puffs every four to six hours as needed for cough or wheeze.    . drospirenone-ethinyl estradiol (YAZ,GIANVI,LORYNA) 3-0.02 MG tablet Take 1 tablet by mouth daily.    Marland Kitchen EPINEPHrine (EPIPEN 2-PAK) 0.3 mg/0.3 mL IJ SOAJ injection Use as directed for life-threatening allergic reactions.    . fexofenadine (ALLEGRA) 180 MG tablet Take 180 mg by mouth daily.    . fluticasone (FLONASE) 50 MCG/ACT nasal spray Place 2 sprays into both nostrils daily. 16 g 1  . montelukast (SINGULAIR) 10 MG tablet Take 1 tablet (10 mg total) by mouth at bedtime. 30 tablet 3  . ondansetron (ZOFRAN) 4 MG tablet Take 1 tablet (4 mg total) by mouth every 8 (eight) hours as needed for nausea or vomiting. 20 tablet 0  . scopolamine (TRANSDERM-SCOP, 1.5 MG,) 1 MG/3DAYS Place 1 patch (1.5 mg total) onto the skin every 3 (three) days. 10 patch 12  . amoxicillin-clavulanate (AUGMENTIN) 875-125 MG tablet Take 1 tablet  by mouth 2 (two) times daily. 20 tablet 0  . chlorpheniramine-HYDROcodone (TUSSIONEX PENNKINETIC ER) 10-8 MG/5ML SUER Take 5 mLs by mouth at bedtime as needed for cough. 115 mL 0   Facility-Administered Medications Prior to Visit  Medication Dose Route Frequency Provider Last Rate Last Dose  . omalizumab Geoffry Paradise) injection 150 mg  150 mg Subcutaneous Q28 days Jessica Priest, MD   150 mg at 07/25/15 1046    ROS Review of Systems  Constitutional: Negative for fever, chills, diaphoresis and fatigue.  HENT: Negative for tinnitus and trouble swallowing.   Eyes: Negative for visual disturbance.  Respiratory: Negative for chest tightness, shortness of breath and wheezing.   Cardiovascular: Negative for chest pain, palpitations and leg swelling.  Gastrointestinal: Negative for nausea, vomiting and diarrhea.  Genitourinary: Negative for decreased urine volume, vaginal bleeding, vaginal discharge, vaginal pain and menstrual problem.  Musculoskeletal: Negative for back pain, arthralgias and neck pain.  Skin: Negative for rash.  Neurological: Negative for dizziness, numbness and headaches.  Psychiatric/Behavioral: Negative for suicidal ideas and sleep disturbance. The patient is not nervous/anxious.     Objective:  BP 102/66 mmHg  Pulse 97  Temp(Src) 97.4 F (36.3 C) (Oral)  Resp 12  Ht 5' (1.524 m)  Wt 123 lb (55.792 kg)  BMI 24.02 kg/m2  SpO2 98%  Physical Exam  Constitutional: She is oriented to person, place, and time. She appears well-developed and well-nourished. No distress.  HENT:  Head: Normocephalic and atraumatic.  Right Ear: External ear normal.  Left Ear: External ear normal.  Nose: Nose normal.  Mouth/Throat: Oropharynx is clear and moist. No oropharyngeal exudate.  TMs and canals clear bilaterally  Eyes: Conjunctivae and EOM are normal. Pupils are equal, round, and reactive to light. Right eye exhibits no discharge. Left eye exhibits no discharge. No scleral icterus.   Neck: Normal range of motion. Neck supple. No thyromegaly present.  Cardiovascular: Normal rate, regular rhythm, normal heart sounds and intact distal pulses.  Exam reveals no gallop and no friction rub.   No murmur heard. Pulmonary/Chest: Effort normal and breath sounds normal. No respiratory distress. She has no wheezes. She has no rales. She exhibits no tenderness.  Breast exam deferred to OB/GYN  Abdominal: Soft. Bowel sounds are normal. She exhibits no distension and no mass. There is no tenderness. There is no rebound and no guarding.  Genitourinary:  Deferred- has OB/GYN  Musculoskeletal: Normal range of motion. She exhibits no edema or tenderness.  Lymphadenopathy:    She has no cervical adenopathy.  Neurological: She is alert and oriented to person, place, and time. She has normal reflexes. No cranial nerve deficit. She exhibits normal muscle tone. Coordination normal.  Skin: Skin is warm and dry. No rash noted. She is not diaphoretic. No erythema. No pallor.  Psychiatric: She has a normal mood and affect. Her behavior is normal. Judgment and thought content normal.   Assessment & Plan:   Misako was seen today for cpe.  Diagnoses and all orders for this visit:  Routine general medical examination at a health care facility -     CBC with Differential/Platelet; Future -     Lipid panel; Future -     Comprehensive metabolic panel; Future -     TSH; Future  Other orders -     ondansetron (ZOFRAN) 4 MG tablet; Take 1 tablet (4 mg total) by mouth every 8 (eight) hours as needed for nausea or vomiting. -     scopolamine (TRANSDERM-SCOP, 1.5 MG,) 1 MG/3DAYS; Place 1 patch (1.5 mg total) onto the skin every 3 (three) days.  I have discontinued Ms. Meyers's amoxicillin-clavulanate and chlorpheniramine-HYDROcodone. I am also having her maintain her drospirenone-ethinyl estradiol, fexofenadine, albuterol, EPINEPHrine, montelukast, fluticasone, ondansetron, and scopolamine. We will  continue to administer omalizumab.  Meds ordered this encounter  Medications  . ondansetron (ZOFRAN) 4 MG tablet    Sig: Take 1 tablet (4 mg total) by mouth every 8 (eight) hours as needed for nausea or vomiting.    Dispense:  20 tablet    Refill:  0    Order Specific Question:  Supervising Provider    Answer:  Duncan Dull L [[id number]]  . scopolamine (TRANSDERM-SCOP, 1.5 MG,) 1 MG/3DAYS    Sig: Place 1 patch (1.5 mg total) onto the skin every 3 (three) days.    Dispense:  10 patch    Refill:  12    Order Specific Question:  Supervising Provider    Answer:  Sherlene Shams [[id number]]     Follow-up: Return if symptoms worsen or fail to improve.

## 2015-08-03 NOTE — Progress Notes (Signed)
Pre visit review using our clinic review tool, if applicable. No additional management support is needed unless otherwise documented below in the visit note. 

## 2015-08-03 NOTE — Patient Instructions (Signed)
Health Maintenance, Female Adopting a healthy lifestyle and getting preventive care can go a long way to promote health and wellness. Talk with your health care provider about what schedule of regular examinations is right for you. This is a good chance for you to check in with your provider about disease prevention and staying healthy. In between checkups, there are plenty of things you can do on your own. Experts have done a lot of research about which lifestyle changes and preventive measures are most likely to keep you healthy. Ask your health care provider for more information. WEIGHT AND DIET  Eat a healthy diet  Be sure to include plenty of vegetables, fruits, low-fat dairy products, and lean protein.  Do not eat a lot of foods high in solid fats, added sugars, or salt.  Get regular exercise. This is one of the most important things you can do for your health.  Most adults should exercise for at least 150 minutes each week. The exercise should increase your heart rate and make you sweat (moderate-intensity exercise).  Most adults should also do strengthening exercises at least twice a week. This is in addition to the moderate-intensity exercise.  Maintain a healthy weight  Body mass index (BMI) is a measurement that can be used to identify possible weight problems. It estimates body fat based on height and weight. Your health care provider can help determine your BMI and help you achieve or maintain a healthy weight.  For females 20 years of age and older:   A BMI below 18.5 is considered underweight.  A BMI of 18.5 to 24.9 is normal.  A BMI of 25 to 29.9 is considered overweight.  A BMI of 30 and above is considered obese.  Watch levels of cholesterol and blood lipids  You should start having your blood tested for lipids and cholesterol at 29 years of age, then have this test every 5 years.  You may need to have your cholesterol levels checked more often if:  Your lipid  or cholesterol levels are high.  You are older than 29 years of age.  You are at high risk for heart disease.  CANCER SCREENING   Lung Cancer  Lung cancer screening is recommended for adults 55-80 years old who are at high risk for lung cancer because of a history of smoking.  A yearly low-dose CT scan of the lungs is recommended for people who:  Currently smoke.  Have quit within the past 15 years.  Have at least a 30-pack-year history of smoking. A pack year is smoking an average of one pack of cigarettes a day for 1 year.  Yearly screening should continue until it has been 15 years since you quit.  Yearly screening should stop if you develop a health problem that would prevent you from having lung cancer treatment.  Breast Cancer  Practice breast self-awareness. This means understanding how your breasts normally appear and feel.  It also means doing regular breast self-exams. Let your health care provider know about any changes, no matter how small.  If you are in your 20s or 30s, you should have a clinical breast exam (CBE) by a health care provider every 1-3 years as part of a regular health exam.  If you are 40 or older, have a CBE every year. Also consider having a breast X-ray (mammogram) every year.  If you have a family history of breast cancer, talk to your health care provider about genetic screening.  If you   are at high risk for breast cancer, talk to your health care provider about having an MRI and a mammogram every year.  Breast cancer gene (BRCA) assessment is recommended for women who have family members with BRCA-related cancers. BRCA-related cancers include:  Breast.  Ovarian.  Tubal.  Peritoneal cancers.  Results of the assessment will determine the need for genetic counseling and BRCA1 and BRCA2 testing. Cervical Cancer Your health care provider may recommend that you be screened regularly for cancer of the pelvic organs (ovaries, uterus, and  vagina). This screening involves a pelvic examination, including checking for microscopic changes to the surface of your cervix (Pap test). You may be encouraged to have this screening done every 3 years, beginning at age 21.  For women ages 30-65, health care providers may recommend pelvic exams and Pap testing every 3 years, or they may recommend the Pap and pelvic exam, combined with testing for human papilloma virus (HPV), every 5 years. Some types of HPV increase your risk of cervical cancer. Testing for HPV may also be done on women of any age with unclear Pap test results.  Other health care providers may not recommend any screening for nonpregnant women who are considered low risk for pelvic cancer and who do not have symptoms. Ask your health care provider if a screening pelvic exam is right for you.  If you have had past treatment for cervical cancer or a condition that could lead to cancer, you need Pap tests and screening for cancer for at least 20 years after your treatment. If Pap tests have been discontinued, your risk factors (such as having a new sexual partner) need to be reassessed to determine if screening should resume. Some women have medical problems that increase the chance of getting cervical cancer. In these cases, your health care provider may recommend more frequent screening and Pap tests. Colorectal Cancer  This type of cancer can be detected and often prevented.  Routine colorectal cancer screening usually begins at 29 years of age and continues through 29 years of age.  Your health care provider may recommend screening at an earlier age if you have risk factors for colon cancer.  Your health care provider may also recommend using home test kits to check for hidden blood in the stool.  A small camera at the end of a tube can be used to examine your colon directly (sigmoidoscopy or colonoscopy). This is done to check for the earliest forms of colorectal  cancer.  Routine screening usually begins at age 50.  Direct examination of the colon should be repeated every 5-10 years through 29 years of age. However, you may need to be screened more often if early forms of precancerous polyps or small growths are found. Skin Cancer  Check your skin from head to toe regularly.  Tell your health care provider about any new moles or changes in moles, especially if there is a change in a mole's shape or color.  Also tell your health care provider if you have a mole that is larger than the size of a pencil eraser.  Always use sunscreen. Apply sunscreen liberally and repeatedly throughout the day.  Protect yourself by wearing long sleeves, pants, a wide-brimmed hat, and sunglasses whenever you are outside. HEART DISEASE, DIABETES, AND HIGH BLOOD PRESSURE   High blood pressure causes heart disease and increases the risk of stroke. High blood pressure is more likely to develop in:  People who have blood pressure in the high end   of the normal range (130-139/85-89 mm Hg).  People who are overweight or obese.  People who are African American.  If you are 38-23 years of age, have your blood pressure checked every 3-5 years. If you are 61 years of age or older, have your blood pressure checked every year. You should have your blood pressure measured twice--once when you are at a hospital or clinic, and once when you are not at a hospital or clinic. Record the average of the two measurements. To check your blood pressure when you are not at a hospital or clinic, you can use:  An automated blood pressure machine at a pharmacy.  A home blood pressure monitor.  If you are between 45 years and 39 years old, ask your health care provider if you should take aspirin to prevent strokes.  Have regular diabetes screenings. This involves taking a blood sample to check your fasting blood sugar level.  If you are at a normal weight and have a low risk for diabetes,  have this test once every three years after 29 years of age.  If you are overweight and have a high risk for diabetes, consider being tested at a younger age or more often. PREVENTING INFECTION  Hepatitis B  If you have a higher risk for hepatitis B, you should be screened for this virus. You are considered at high risk for hepatitis B if:  You were born in a country where hepatitis B is common. Ask your health care provider which countries are considered high risk.  Your parents were born in a high-risk country, and you have not been immunized against hepatitis B (hepatitis B vaccine).  You have HIV or AIDS.  You use needles to inject street drugs.  You live with someone who has hepatitis B.  You have had sex with someone who has hepatitis B.  You get hemodialysis treatment.  You take certain medicines for conditions, including cancer, organ transplantation, and autoimmune conditions. Hepatitis C  Blood testing is recommended for:  Everyone born from [birth year] through 1964.  Anyone with known risk factors for hepatitis C. Sexually transmitted infections (STIs)  You should be screened for sexually transmitted infections (STIs) including gonorrhea and chlamydia if:  You are sexually active and are younger than 29 years of age.  You are older than 29 years of age and your health care provider tells you that you are at risk for this type of infection.  Your sexual activity has changed since you were last screened and you are at an increased risk for chlamydia or gonorrhea. Ask your health care provider if you are at risk.  If you do not have HIV, but are at risk, it may be recommended that you take a prescription medicine daily to prevent HIV infection. This is called pre-exposure prophylaxis (PrEP). You are considered at risk if:  You are sexually active and do not regularly use condoms or know the HIV status of your partner(s).  You take drugs by injection.  You are sexually  active with a partner who has HIV. Talk with your health care provider about whether you are at high risk of being infected with HIV. If you choose to begin PrEP, you should first be tested for HIV. You should then be tested every 3 months for as long as you are taking PrEP.  PREGNANCY   If you are premenopausal and you may become pregnant, ask your health care provider about preconception counseling.  If you may  become pregnant, take 400 to 800 micrograms (mcg) of folic acid every day.  If you want to prevent pregnancy, talk to your health care provider about birth control (contraception). OSTEOPOROSIS AND MENOPAUSE   Osteoporosis is a disease in which the bones lose minerals and strength with aging. This can result in serious bone fractures. Your risk for osteoporosis can be identified using a bone density scan.  If you are 61 years of age or older, or if you are at risk for osteoporosis and fractures, ask your health care provider if you should be screened.  Ask your health care provider whether you should take a calcium or vitamin D supplement to lower your risk for osteoporosis.  Menopause may have certain physical symptoms and risks.  Hormone replacement therapy may reduce some of these symptoms and risks. Talk to your health care provider about whether hormone replacement therapy is right for you.  HOME CARE INSTRUCTIONS   Schedule regular health, dental, and eye exams.  Stay current with your immunizations.   Do not use any tobacco products including cigarettes, chewing tobacco, or electronic cigarettes.  If you are pregnant, do not drink alcohol.  If you are breastfeeding, limit how much and how often you drink alcohol.  Limit alcohol intake to no more than 1 drink per day for nonpregnant women. One drink equals 12 ounces of beer, 5 ounces of wine, or 1 ounces of hard liquor.  Do not use street drugs.  Do not share needles.  Ask your health care provider for help if  you need support or information about quitting drugs.  Tell your health care provider if you often feel depressed.  Tell your health care provider if you have ever been abused or do not feel safe at home.   This information is not intended to replace advice given to you by your health care provider. Make sure you discuss any questions you have with your health care provider.   Document Released: 12/24/2010 Document Revised: 07/01/2014 Document Reviewed: 05/12/2013 Elsevier Interactive Patient Education [employer].

## 2015-08-08 NOTE — Assessment & Plan Note (Addendum)
Discussed acute and chronic issues. Reviewed health maintenance measures, PFSHx, and immunizations. Obtain routine labs TSH, Lipid panel, CBC w/ diff, and CMET.   Pap and breast exam deferred to OB/GYN Patient was okay with receiving Tdap, but we forgot to give it to her will get at next visit.

## 2015-08-18 ENCOUNTER — Encounter: Payer: Self-pay | Admitting: Internal Medicine

## 2015-08-18 ENCOUNTER — Ambulatory Visit (INDEPENDENT_AMBULATORY_CARE_PROVIDER_SITE_OTHER): Payer: BLUE CROSS/BLUE SHIELD | Admitting: Internal Medicine

## 2015-08-18 VITALS — BP 106/74 | HR 85 | Temp 99.0°F | Wt 120.0 lb

## 2015-08-18 DIAGNOSIS — J069 Acute upper respiratory infection, unspecified: Secondary | ICD-10-CM

## 2015-08-18 MED ORDER — AZITHROMYCIN 250 MG PO TABS: ORAL_TABLET | ORAL | Status: DC

## 2015-08-18 NOTE — Progress Notes (Signed)
Pre visit review using our clinic review tool, if applicable. No additional management support is needed unless otherwise documented below in the visit note. 

## 2015-08-18 NOTE — Progress Notes (Signed)
HPI  Pt presents to the clinic today with c/o facial pressure, runny nose, nasal, sore throat. cough and chest congestion. This started 3 days ago. The cough is non productive. She does not feel short of breath. She denies fever, chills or body aches. She has tried Mucinex, Sudafed and Singulair. She has not had to use her Albuterol inhaler. She does have a history of asthma and allergies. She does have sick contacts. She did get her flu shot.  Review of Systems      Past Medical History  Diagnosis Date  . Panic disorder without agoraphobia   . Anxiety state, unspecified   . Allergy   . Asthma   . Urticaria     Family History  Problem Relation Age of Onset  . Hypertension Father   . Depression Mother   . Diabetes    . Cancer    . Stroke      Social History   Social History  . Marital Status: Single    Spouse Name: N/A  . Number of Children: 0  . Years of Education: N/A   Occupational History  . Pharmacy tech     [employer]   .          Social History Main Topics  . Smoking status: Never Smoker   . Smokeless tobacco: Never Used  . Alcohol Use: 0.0 oz/week    0 Standard drinks or equivalent per week     Comment: very rarely  . Drug Use: No  . Sexual Activity: Yes   Other Topics Concern  . Not on file   Social History Narrative              Allergies  Allergen Reactions  . Codeine Nausea Only and Other (See Comments)    Passes out  . Prednisone Nausea Only and Other (See Comments)    DIZZINESS.   . Vicodin [Hydrocodone-Acetaminophen] Nausea Only and Other (See Comments)    Passes out     Constitutional: Positive headache Denies fatigue, fever or abrupt weight changes.  HEENT:  Positive runny nose, nasal congestion, sore throat. Denies eye redness, eye pain, pressure behind the eyes, facial pain, ear pain, ringing in the ears, wax buildup, runny nose or bloody nose. Respiratory: Positive cough. Denies difficulty breathing or shortness of breath.   Cardiovascular: Denies chest pain, chest tightness, palpitations or swelling in the hands or feet.   No other specific complaints in a complete review of systems (except as listed in HPI above).  Objective:   BP 106/74 mmHg  Pulse 85  Temp(Src) 99 F (37.2 C) (Oral)  Wt 120 lb (54.432 kg)  SpO2 99%  Wt Readings from Last 3 Encounters:  08/18/15 120 lb (54.432 kg)  08/03/15 123 lb (55.792 kg)  06/22/15 121 lb 12.8 oz (55.248 kg)     General: Appears her stated age, in NAD. HEENT: Head: normal shape and size, no sinus tenderness noted; Eyes: sclera white, no icterus, conjunctiva pink; Ears: Tm's gray and intact, normal light reflex; Nose: mucosa pink and moist, septum midline; Throat/Mouth: + PND. Teeth present, mucosa erythematous and moist, no exudate noted, no lesions or ulcerations noted.  Neck: Cervical lymphadenopathy bilaterally.  Cardiovascular: Normal rate and rhythm. S1,S2 noted.  No murmur, rubs or gallops noted.  Pulmonary/Chest: Normal effort and positive vesicular breath sounds. No respiratory distress. No wheezes, rales or ronchi noted.      Assessment & Plan:   Upper Respiratory Infection:  Get some rest  and drink plenty of water Do salt water gargles for the sore throat Ibuprofen for fever/inflammation eRx for Azithromax x 5 days Delsym as needed for cough Continue Allegra, Singulair, Mucinex  RTC as needed or if symptoms persist.

## 2015-08-18 NOTE — Patient Instructions (Signed)
Upper Respiratory Infection, Adult Most upper respiratory infections (URIs) are a viral infection of the air passages leading to the lungs. A URI affects the nose, throat, and upper air passages. The most common type of URI is nasopharyngitis and is typically referred to as "the common cold." URIs run their course and usually go away on their own. Most of the time, a URI does not require medical attention, but sometimes a bacterial infection in the upper airways can follow a viral infection. This is called a secondary infection. Sinus and middle ear infections are common types of secondary upper respiratory infections. Bacterial pneumonia can also complicate a URI. A URI can worsen asthma and chronic obstructive pulmonary disease (COPD). Sometimes, these complications can require emergency medical care and may be life threatening.  CAUSES Almost all URIs are caused by viruses. A virus is a type of germ and can spread from one person to another.  RISKS FACTORS You may be at risk for a URI if:   You smoke.   You have chronic heart or lung disease.  You have a weakened defense (immune) system.   You are very young or very old.   You have nasal allergies or asthma.  You work in crowded or poorly ventilated areas.  You work in health care facilities or schools. SIGNS AND SYMPTOMS  Symptoms typically develop 2-3 days after you come in contact with a cold virus. Most viral URIs last 7-10 days. However, viral URIs from the influenza virus (flu virus) can last 14-18 days and are typically more severe. Symptoms may include:   Runny or stuffy (congested) nose.   Sneezing.   Cough.   Sore throat.   Headache.   Fatigue.   Fever.   Loss of appetite.   Pain in your forehead, behind your eyes, and over your cheekbones (sinus pain).  Muscle aches.  DIAGNOSIS  Your health care provider may diagnose a URI by:  Physical exam.  Tests to check that your symptoms are not due to  another condition such as:  Strep throat.  Sinusitis.  Pneumonia.  Asthma. TREATMENT  A URI goes away on its own with time. It cannot be cured with medicines, but medicines may be prescribed or recommended to relieve symptoms. Medicines may help:  Reduce your fever.  Reduce your cough.  Relieve nasal congestion. HOME CARE INSTRUCTIONS   Take medicines only as directed by your health care provider.   Gargle warm saltwater or take cough drops to comfort your throat as directed by your health care provider.  Use a warm mist humidifier or inhale steam from a shower to increase air moisture. This may make it easier to breathe.  Drink enough fluid to keep your urine clear or pale yellow.   Eat soups and other clear broths and maintain good nutrition.   Rest as needed.   Return to work when your temperature has returned to normal or as your health care provider advises. You may need to stay home longer to avoid infecting others. You can also use a face mask and careful hand washing to prevent spread of the virus.  Increase the usage of your inhaler if you have asthma.   Do not use any tobacco products, including cigarettes, chewing tobacco, or electronic cigarettes. If you need help quitting, ask your health care provider. PREVENTION  The best way to protect yourself from getting a cold is to practice good hygiene.   Avoid oral or hand contact with people with cold   symptoms.   Wash your hands often if contact occurs.  There is no clear evidence that vitamin C, vitamin E, echinacea, or exercise reduces the chance of developing a cold. However, it is always recommended to get plenty of rest, exercise, and practice good nutrition.  SEEK MEDICAL CARE IF:   You are getting worse rather than better.   Your symptoms are not controlled by medicine.   You have chills.  You have worsening shortness of breath.  You have brown or red mucus.  You have yellow or brown nasal  discharge.  You have pain in your face, especially when you bend forward.  You have a fever.  You have swollen neck glands.  You have pain while swallowing.  You have white areas in the back of your throat. SEEK IMMEDIATE MEDICAL CARE IF:   You have severe or persistent:  Headache.  Ear pain.  Sinus pain.  Chest pain.  You have chronic lung disease and any of the following:  Wheezing.  Prolonged cough.  Coughing up blood.  A change in your usual mucus.  You have a stiff neck.  You have changes in your:  Vision.  Hearing.  Thinking.  Mood. MAKE SURE YOU:   Understand these instructions.  Will watch your condition.  Will get help right away if you are not doing well or get worse.   This information is not intended to replace advice given to you by your health care provider. Make sure you discuss any questions you have with your health care provider.   Document Released: 12/04/2000 Document Revised: 10/25/2014 Document Reviewed: 09/15/2013 Elsevier Interactive Patient Education 2016 [employer]  

## 2015-08-22 ENCOUNTER — Ambulatory Visit (INDEPENDENT_AMBULATORY_CARE_PROVIDER_SITE_OTHER): Payer: BLUE CROSS/BLUE SHIELD

## 2015-08-22 DIAGNOSIS — J4550 Severe persistent asthma, uncomplicated: Secondary | ICD-10-CM

## 2015-08-22 DIAGNOSIS — L501 Idiopathic urticaria: Secondary | ICD-10-CM | POA: Diagnosis not present

## 2015-09-05 ENCOUNTER — Other Ambulatory Visit (INDEPENDENT_AMBULATORY_CARE_PROVIDER_SITE_OTHER): Payer: BLUE CROSS/BLUE SHIELD

## 2015-09-05 ENCOUNTER — Ambulatory Visit (INDEPENDENT_AMBULATORY_CARE_PROVIDER_SITE_OTHER): Payer: BLUE CROSS/BLUE SHIELD | Admitting: *Deleted

## 2015-09-05 DIAGNOSIS — Z0000 Encounter for general adult medical examination without abnormal findings: Secondary | ICD-10-CM

## 2015-09-05 DIAGNOSIS — Z23 Encounter for immunization: Secondary | ICD-10-CM

## 2015-09-05 DIAGNOSIS — R7989 Other specified abnormal findings of blood chemistry: Secondary | ICD-10-CM

## 2015-09-05 LAB — CBC WITH DIFFERENTIAL/PLATELET
BASOS ABS: 0 10*3/uL (ref 0.0–0.1)
Basophils Relative: 0.4 % (ref 0.0–3.0)
EOS ABS: 0.2 10*3/uL (ref 0.0–0.7)
Eosinophils Relative: 2.6 % (ref 0.0–5.0)
HEMATOCRIT: 38.9 % (ref 36.0–46.0)
HEMOGLOBIN: 12.9 g/dL (ref 12.0–15.0)
LYMPHS PCT: 22.3 % (ref 12.0–46.0)
Lymphs Abs: 1.6 10*3/uL (ref 0.7–4.0)
MCHC: 33.2 g/dL (ref 30.0–36.0)
MCV: 81 fl (ref 78.0–100.0)
Monocytes Absolute: 0.3 10*3/uL (ref 0.1–1.0)
Monocytes Relative: 4.6 % (ref 3.0–12.0)
Neutro Abs: 5.1 10*3/uL (ref 1.4–7.7)
Neutrophils Relative %: 70.1 % (ref 43.0–77.0)
Platelets: 267 10*3/uL (ref 150.0–400.0)
RBC: 4.8 Mil/uL (ref 3.87–5.11)
RDW: 13.5 % (ref 11.5–15.5)
WBC: 7.3 10*3/uL (ref 4.0–10.5)

## 2015-09-05 LAB — TSH: TSH: 2.72 u[IU]/mL (ref 0.35–4.50)

## 2015-09-05 LAB — LIPID PANEL
CHOL/HDL RATIO: 3
Cholesterol: 153 mg/dL (ref 0–200)
HDL: 59.3 mg/dL (ref >39.00)
NONHDL: 93.34
TRIGLYCERIDES: 204 mg/dL — AB (ref 0.0–149.0)
VLDL: 40.8 mg/dL — ABNORMAL HIGH (ref 0.0–40.0)

## 2015-09-05 LAB — COMPREHENSIVE METABOLIC PANEL
ALK PHOS: 41 U/L (ref 39–117)
ALT: 12 U/L (ref 0–35)
AST: 18 U/L (ref 0–37)
Albumin: 3.8 g/dL (ref 3.5–5.2)
BILIRUBIN TOTAL: 0.3 mg/dL (ref 0.2–1.2)
BUN: 10 mg/dL (ref 6–23)
CO2: 19 mEq/L (ref 19–32)
CREATININE: 0.71 mg/dL (ref 0.40–1.20)
Calcium: 8.7 mg/dL (ref 8.4–10.5)
Chloride: 107 mEq/L (ref 96–112)
GFR: 103.67 mL/min (ref >60.00)
GLUCOSE: 93 mg/dL (ref 70–99)
Potassium: 3.8 mEq/L (ref 3.5–5.1)
Sodium: 141 mEq/L (ref 135–145)
TOTAL PROTEIN: 6.6 g/dL (ref 6.0–8.3)

## 2015-09-05 LAB — LDL CHOLESTEROL, DIRECT: LDL DIRECT: 62 mg/dL

## 2015-09-07 ENCOUNTER — Other Ambulatory Visit: Payer: Self-pay

## 2015-09-19 ENCOUNTER — Ambulatory Visit (INDEPENDENT_AMBULATORY_CARE_PROVIDER_SITE_OTHER): Payer: BLUE CROSS/BLUE SHIELD

## 2015-09-19 DIAGNOSIS — J4540 Moderate persistent asthma, uncomplicated: Secondary | ICD-10-CM

## 2015-09-19 DIAGNOSIS — L501 Idiopathic urticaria: Secondary | ICD-10-CM

## 2015-10-12 ENCOUNTER — Other Ambulatory Visit: Payer: Self-pay | Admitting: Allergy and Immunology

## 2015-10-16 ENCOUNTER — Other Ambulatory Visit: Payer: Self-pay

## 2015-10-16 MED ORDER — FLUTICASONE PROPIONATE 50 MCG/ACT NA SUSP: 2.0000 | Freq: Every day | NASAL | Status: DC

## 2015-10-17 ENCOUNTER — Other Ambulatory Visit: Payer: Self-pay | Admitting: *Deleted

## 2015-10-17 ENCOUNTER — Ambulatory Visit (INDEPENDENT_AMBULATORY_CARE_PROVIDER_SITE_OTHER): Payer: BLUE CROSS/BLUE SHIELD

## 2015-10-17 DIAGNOSIS — J4540 Moderate persistent asthma, uncomplicated: Secondary | ICD-10-CM

## 2015-10-17 DIAGNOSIS — L501 Idiopathic urticaria: Secondary | ICD-10-CM

## 2015-10-17 MED ORDER — MONTELUKAST SODIUM 10 MG PO TABS: 10.0000 mg | ORAL_TABLET | Freq: Every day | ORAL | Status: DC

## 2015-11-06 ENCOUNTER — Other Ambulatory Visit: Payer: Self-pay | Admitting: *Deleted

## 2015-11-06 MED ORDER — ALBUTEROL SULFATE HFA 108 (90 BASE) MCG/ACT IN AERS: 2.0000 | INHALATION_SPRAY | RESPIRATORY_TRACT | Status: DC | PRN

## 2015-11-06 MED ORDER — MONTELUKAST SODIUM 10 MG PO TABS: 10.0000 mg | ORAL_TABLET | Freq: Every day | ORAL | Status: DC

## 2015-11-14 ENCOUNTER — Encounter: Payer: Self-pay | Admitting: Allergy and Immunology

## 2015-11-14 ENCOUNTER — Ambulatory Visit (INDEPENDENT_AMBULATORY_CARE_PROVIDER_SITE_OTHER): Payer: BLUE CROSS/BLUE SHIELD | Admitting: Allergy and Immunology

## 2015-11-14 ENCOUNTER — Encounter: Payer: Self-pay | Admitting: *Deleted

## 2015-11-14 DIAGNOSIS — J4520 Mild intermittent asthma, uncomplicated: Secondary | ICD-10-CM

## 2015-11-14 DIAGNOSIS — H1010 Acute atopic conjunctivitis, unspecified eye: Secondary | ICD-10-CM

## 2015-11-14 DIAGNOSIS — L508 Other urticaria: Secondary | ICD-10-CM

## 2015-11-14 DIAGNOSIS — L501 Idiopathic urticaria: Secondary | ICD-10-CM | POA: Diagnosis not present

## 2015-11-14 DIAGNOSIS — J309 Allergic rhinitis, unspecified: Secondary | ICD-10-CM | POA: Diagnosis not present

## 2015-11-14 NOTE — Progress Notes (Signed)
Follow-up Note  Referring Provider: Carollee Leitzoss, Carrie M, NP Primary Provider: Carollee Leitzoss, Carrie M, NP Date of Office Visit: 11/14/2015  Subjective:   Mariah [name] (DOB: 04/08/1986) is a 30 y.o. female [facility] in re-evaluation of the following:  HPI: Lawson FiscalLori returns to this clinic in reevaluation of her chronic urticaria treated with omalizumab and allergic rhinitis and intermittent asthma. She is doing very well at this point in time regarding her urticaria while using 150 mg of Xolair every month. She's had very little problems with her upper airways and she's had no issues with asthma and has had not had to use a short acting bronchodilator since I've last seen her in his clinic approximately 5 months ago. She has not required a systemic steroid to treat any of her conditions since I've last seen her in his clinic.    Medication List           albuterol 108 (90 Base) MCG/ACT inhaler  Commonly known as:  VENTOLIN HFA  Inhale 2 puffs into the lungs every 4 (four) hours as needed for wheezing or shortness of breath.     drospirenone-ethinyl estradiol 3-0.02 MG tablet  Commonly known as:  YAZ,GIANVI,LORYNA  Take 1 tablet by mouth daily.     EPIPEN 2-PAK 0.3 mg/0.3 mL Soaj injection  Generic drug:  EPINEPHrine  Use as directed for life-threatening allergic reactions.     fexofenadine 180 MG tablet  Commonly known as:  ALLEGRA  Take 180 mg by mouth daily. Reported on 08/18/2015     fluticasone 50 MCG/ACT nasal spray  Commonly known as:  FLONASE  Place 2 sprays into both nostrils daily. Please dispense generic.     montelukast 10 MG tablet  Commonly known as:  SINGULAIR  Take 1 tablet (10 mg total) by mouth at bedtime.     ondansetron 4 MG tablet  Commonly known as:  ZOFRAN  Take 1 tablet (4 mg total) by mouth every 8 (eight) hours as needed for nausea or vomiting.        Past Medical History  Diagnosis Date  . Panic disorder  without agoraphobia   . Anxiety state, unspecified   . Allergy   . Asthma   . Urticaria     Past Surgical History  Procedure Laterality Date  . Tonsillectomy      01/26/07- Dr. Jenne PaneBates    Allergies  Allergen Reactions  . Codeine Nausea Only and Other (See Comments)    Passes out  . Prednisone Nausea Only and Other (See Comments)    DIZZINESS.   . Vicodin [Hydrocodone-Acetaminophen] Nausea Only and Other (See Comments)    Passes out    Review of systems negative except as noted in HPI / PMHx or noted below:  Review of Systems  Constitutional: Negative.   HENT: Negative.   Eyes: Negative.   Respiratory: Negative.   Cardiovascular: Negative.   Gastrointestinal: Negative.   Genitourinary: Negative.   Musculoskeletal: Negative.   Skin: Negative.   Neurological: Negative.   Endo/Heme/Allergies: Negative.   Psychiatric/Behavioral: Negative.      Objective:   Filed Vitals:   11/14/15 1714  BP: 122/72  Pulse: 68  Resp: 16          Physical Exam  Constitutional: She is well-developed, well-nourished, and in no distress.  HENT:  Head: Normocephalic.  Right Ear: Tympanic membrane, external ear and ear canal normal.  Left Ear: Tympanic membrane, external  ear and ear canal normal.  Nose: Nose normal. No mucosal edema or rhinorrhea.  Mouth/Throat: Uvula is midline, oropharynx is clear and moist and mucous membranes are normal. No oropharyngeal exudate.  Eyes: Conjunctivae are normal.  Neck: Trachea normal. No tracheal tenderness present. No tracheal deviation present. No thyromegaly present.  Cardiovascular: Normal rate, regular rhythm, S1 normal, S2 normal and normal heart sounds.   No murmur heard. Pulmonary/Chest: Breath sounds normal. No stridor. No respiratory distress. She has no wheezes. She has no rales.  Musculoskeletal: She exhibits no edema.  Lymphadenopathy:       Head (right side): No tonsillar adenopathy present.       Head (left side): No tonsillar  adenopathy present.    She has no cervical adenopathy.  Neurological: She is alert. Gait normal.  Skin: No rash noted. She is not diaphoretic. No erythema. Nails show no clubbing.  Psychiatric: Mood and affect normal.    Diagnostics: None     Assessment and Plan:   1. Chronic urticaria   2. Mild intermittent asthma, uncomplicated   3. Allergic rhinoconjunctivitis     1. Continue Xolair 150 mg monthly  2. Continue EpiPen and ProAir HFA if needed  3. Continue singular 10 mg daily and Allegra 180 one tablet daily  4. Return to clinic in 6 months or earlier if problem  Alyana has done quite well on her current dose of Xolair. Even though we lowered the dose from 300 mg to 150 mg during her last visit she still has very good control of her urticaria. She'll continue to use therapy as noted above directed against both her urticaria and her mild intermittent asthma and allergic rhinitis. I'll see her back in this clinic in 6 months or earlier if there is a problem.  Laurette Schimke, MD [facility]

## 2015-11-14 NOTE — Patient Instructions (Signed)
  1. Continue Xolair to 150 mg monthly  2. Continue EpiPen and ProAir HFA if needed  3. Continue singular 10 mg daily and Allegra 180 one tablet daily  4. Return to clinic in 6 months or earlier if problem

## 2015-11-14 NOTE — Progress Notes (Signed)
This encounter was created in error - please disregard.

## 2015-11-15 ENCOUNTER — Other Ambulatory Visit: Payer: Self-pay | Admitting: *Deleted

## 2015-11-15 MED ORDER — FLUTICASONE PROPIONATE 50 MCG/ACT NA SUSP: 2.0000 | Freq: Every day | NASAL | Status: DC

## 2015-12-01 ENCOUNTER — Ambulatory Visit: Payer: BLUE CROSS/BLUE SHIELD | Admitting: Family Medicine

## 2015-12-12 ENCOUNTER — Ambulatory Visit (INDEPENDENT_AMBULATORY_CARE_PROVIDER_SITE_OTHER): Payer: BLUE CROSS/BLUE SHIELD

## 2015-12-12 DIAGNOSIS — L501 Idiopathic urticaria: Secondary | ICD-10-CM

## 2015-12-12 DIAGNOSIS — L508 Other urticaria: Secondary | ICD-10-CM

## 2015-12-20 ENCOUNTER — Other Ambulatory Visit: Payer: Self-pay

## 2015-12-20 MED ORDER — MONTELUKAST SODIUM 10 MG PO TABS: 10.0000 mg | ORAL_TABLET | Freq: Every day | ORAL | Status: DC

## 2016-01-09 ENCOUNTER — Ambulatory Visit (INDEPENDENT_AMBULATORY_CARE_PROVIDER_SITE_OTHER): Payer: BLUE CROSS/BLUE SHIELD | Admitting: *Deleted

## 2016-01-09 DIAGNOSIS — L500 Allergic urticaria: Secondary | ICD-10-CM | POA: Diagnosis not present

## 2016-02-06 ENCOUNTER — Ambulatory Visit (INDEPENDENT_AMBULATORY_CARE_PROVIDER_SITE_OTHER): Payer: BLUE CROSS/BLUE SHIELD | Admitting: *Deleted

## 2016-02-06 DIAGNOSIS — L501 Idiopathic urticaria: Secondary | ICD-10-CM

## 2016-02-06 DIAGNOSIS — L500 Allergic urticaria: Secondary | ICD-10-CM

## 2016-03-05 ENCOUNTER — Ambulatory Visit (INDEPENDENT_AMBULATORY_CARE_PROVIDER_SITE_OTHER): Payer: BLUE CROSS/BLUE SHIELD | Admitting: *Deleted

## 2016-03-05 ENCOUNTER — Ambulatory Visit: Payer: BLUE CROSS/BLUE SHIELD

## 2016-03-05 DIAGNOSIS — L508 Other urticaria: Secondary | ICD-10-CM

## 2016-03-05 DIAGNOSIS — L501 Idiopathic urticaria: Secondary | ICD-10-CM | POA: Diagnosis not present

## 2016-03-21 ENCOUNTER — Encounter: Payer: Self-pay | Admitting: Family Medicine

## 2016-03-21 ENCOUNTER — Ambulatory Visit (INDEPENDENT_AMBULATORY_CARE_PROVIDER_SITE_OTHER): Payer: BLUE CROSS/BLUE SHIELD | Admitting: Family Medicine

## 2016-03-21 VITALS — BP 98/60 | HR 76 | Temp 98.6°F | Ht 62.0 in | Wt 124.0 lb

## 2016-03-21 DIAGNOSIS — R1033 Periumbilical pain: Secondary | ICD-10-CM

## 2016-03-21 DIAGNOSIS — R197 Diarrhea, unspecified: Secondary | ICD-10-CM

## 2016-03-21 LAB — COMPREHENSIVE METABOLIC PANEL
ALBUMIN: 3.8 g/dL (ref 3.5–5.2)
ALK PHOS: 42 U/L (ref 39–117)
ALT: 9 U/L (ref 0–35)
AST: 13 U/L (ref 0–37)
BUN: 10 mg/dL (ref 6–23)
CALCIUM: 8.6 mg/dL (ref 8.4–10.5)
CHLORIDE: 104 meq/L (ref 96–112)
CO2: 26 mEq/L (ref 19–32)
Creatinine, Ser: 0.68 mg/dL (ref 0.40–1.20)
GFR: 108.55 mL/min (ref >60.00)
Glucose, Bld: 101 mg/dL — ABNORMAL HIGH (ref 70–99)
POTASSIUM: 4 meq/L (ref 3.5–5.1)
SODIUM: 137 meq/L (ref 135–145)
Total Bilirubin: 0.4 mg/dL (ref 0.2–1.2)
Total Protein: 6.8 g/dL (ref 6.0–8.3)

## 2016-03-21 LAB — CBC
HEMATOCRIT: 38.5 % (ref 36.0–46.0)
HEMOGLOBIN: 13 g/dL (ref 12.0–15.0)
MCHC: 33.8 g/dL (ref 30.0–36.0)
MCV: 80.6 fl (ref 78.0–100.0)
PLATELETS: 295 10*3/uL (ref 150.0–400.0)
RBC: 4.77 Mil/uL (ref 3.87–5.11)
RDW: 13.2 % (ref 11.5–15.5)
WBC: 14.4 10*3/uL — ABNORMAL HIGH (ref 4.0–10.5)

## 2016-03-21 LAB — AMYLASE: Amylase: 52 U/L (ref 27–131)

## 2016-03-21 NOTE — Patient Instructions (Signed)
Please try some tylenol or ibuprofen for your residual pain Eat a bland diet Drink enough liquids so your urine is light yellow Go to emergency room if you develop more sever pain or persistent vomiting

## 2016-03-21 NOTE — Progress Notes (Signed)
   Subjective:    Patient ID: Mariah [name], female    DOB: 10/23/1985, 30 y.o.   MRN: [id number]  HPI This is a 30 yo female who presents today with abdominal pain that started this morning- awakening her from sleep. She woke up with the pain and drank some soda with some relief. 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 She had worsening of the episode that "doubled" her over. She took a friend's hyoscyamine with some relief. Has had 5 episodes of liquid to loose stools today. Abdomen feels sore now. Some nausea, no vomiting. No fever. No sick contacts. Had flu shot yesterday. Last BM since coming to office for her appointment. Pain not worse with walking, somewhat worse with standing. Pain at its worse was 9/10 is currently 3/10. No dysuria, no frequency, no hematuria. No recent travel or raw/undercooked food.   Takes continuous OCPs, no missed doses. Occasional spotting, nothing unusual.   Past Medical History:  Diagnosis Date  . Allergy   . Anxiety state, unspecified   . Asthma   . Panic disorder without agoraphobia   . Urticaria    Past Surgical History:  Procedure Laterality Date  . TONSILLECTOMY     01/26/07- Dr. Jenne PaneBates   Family History  Problem Relation Age of Onset  . Hypertension Father   . Depression Mother   . Diabetes    . Cancer    . Stroke     Social History  Substance Use Topics  . Smoking status: Never Smoker  . Smokeless tobacco: Never Used  . Alcohol use 0.0 oz/week     Comment: very rarely      Review of Systems Per HPI    Objective:   Physical Exam  Constitutional: She is oriented to person, place, and time. She appears well-developed and well-nourished. No distress.  HENT:  Head: Normocephalic and atraumatic.  Eyes: Conjunctivae are normal. No scleral icterus.  Neck: Normal range of motion. Neck supple.  Cardiovascular: Normal rate, regular rhythm and normal heart sounds.   Pulmonary/Chest: Effort normal and breath sounds normal.  Abdominal:  Soft. Bowel sounds are normal. She exhibits no distension and no mass. There is no tenderness. There is no rebound and no guarding.  Neurological: She is alert and oriented to person, place, and time.  Skin: Skin is warm and dry. She is not diaphoretic.  Psychiatric: She has a normal mood and affect. Her behavior is normal. Judgment and thought content normal.  Vitals reviewed.     BP 98/60   Pulse 76   Temp 98.6 F (37 C)   Ht 5\' 2"  (1.575 m)   Wt 124 lb (56.2 kg)   SpO2 99%   BMI 22.68 kg/m  Wt Readings from Last 3 Encounters:  03/21/16 124 lb (56.2 kg)  08/18/15 120 lb (54.4 kg)  08/03/15 123 lb (55.8 kg)       Assessment & Plan:  1. Periumbilical abdominal pain - discussed possible causes with patient, likely viral given diarrhea, will check labs - RTC/ER precautions reviewed, advised otc analgesics, bland diet - Comprehensive metabolic panel - CBC - Amylase  2. Diarrhea, unspecified type - Comprehensive metabolic panel - CBC - Amylase   Olean Reeeborah Gessner, FNP-BC  [facility]  03/21/2016 2:27 PM

## 2016-04-02 ENCOUNTER — Ambulatory Visit (INDEPENDENT_AMBULATORY_CARE_PROVIDER_SITE_OTHER): Payer: BLUE CROSS/BLUE SHIELD | Admitting: *Deleted

## 2016-04-02 DIAGNOSIS — L501 Idiopathic urticaria: Secondary | ICD-10-CM

## 2016-04-02 DIAGNOSIS — L508 Other urticaria: Secondary | ICD-10-CM

## 2016-04-30 ENCOUNTER — Ambulatory Visit (INDEPENDENT_AMBULATORY_CARE_PROVIDER_SITE_OTHER): Payer: BLUE CROSS/BLUE SHIELD

## 2016-04-30 DIAGNOSIS — L500 Allergic urticaria: Secondary | ICD-10-CM | POA: Diagnosis not present

## 2016-05-02 ENCOUNTER — Other Ambulatory Visit: Payer: Self-pay | Admitting: *Deleted

## 2016-05-02 MED ORDER — MONTELUKAST SODIUM 10 MG PO TABS: ORAL_TABLET | ORAL | 0 refills | Status: DC

## 2016-05-28 ENCOUNTER — Ambulatory Visit (INDEPENDENT_AMBULATORY_CARE_PROVIDER_SITE_OTHER): Payer: BLUE CROSS/BLUE SHIELD | Admitting: *Deleted

## 2016-05-28 DIAGNOSIS — L500 Allergic urticaria: Secondary | ICD-10-CM | POA: Diagnosis not present

## 2016-05-29 ENCOUNTER — Other Ambulatory Visit: Payer: Self-pay

## 2016-05-29 MED ORDER — MONTELUKAST SODIUM 10 MG PO TABS: ORAL_TABLET | ORAL | 0 refills | Status: DC

## 2016-06-25 ENCOUNTER — Ambulatory Visit (INDEPENDENT_AMBULATORY_CARE_PROVIDER_SITE_OTHER): Payer: BLUE CROSS/BLUE SHIELD | Admitting: *Deleted

## 2016-06-25 DIAGNOSIS — L501 Idiopathic urticaria: Secondary | ICD-10-CM

## 2016-06-25 DIAGNOSIS — L500 Allergic urticaria: Secondary | ICD-10-CM

## 2016-07-04 ENCOUNTER — Other Ambulatory Visit: Payer: Self-pay | Admitting: *Deleted

## 2016-07-04 MED ORDER — ALBUTEROL SULFATE HFA 108 (90 BASE) MCG/ACT IN AERS: 2.0000 | INHALATION_SPRAY | RESPIRATORY_TRACT | 0 refills | Status: DC | PRN

## 2016-07-09 ENCOUNTER — Ambulatory Visit (INDEPENDENT_AMBULATORY_CARE_PROVIDER_SITE_OTHER): Payer: BLUE CROSS/BLUE SHIELD | Admitting: Allergy and Immunology

## 2016-07-09 ENCOUNTER — Encounter: Payer: Self-pay | Admitting: Allergy and Immunology

## 2016-07-09 VITALS — BP 102/62 | HR 68 | Resp 16

## 2016-07-09 DIAGNOSIS — J309 Allergic rhinitis, unspecified: Secondary | ICD-10-CM | POA: Diagnosis not present

## 2016-07-09 DIAGNOSIS — J4520 Mild intermittent asthma, uncomplicated: Secondary | ICD-10-CM | POA: Diagnosis not present

## 2016-07-09 DIAGNOSIS — H1010 Acute atopic conjunctivitis, unspecified eye: Secondary | ICD-10-CM | POA: Diagnosis not present

## 2016-07-09 DIAGNOSIS — L500 Allergic urticaria: Secondary | ICD-10-CM | POA: Diagnosis not present

## 2016-07-09 MED ORDER — FLUTICASONE PROPIONATE 50 MCG/ACT NA SUSP: 2.0000 | Freq: Every day | NASAL | 5 refills | Status: DC

## 2016-07-09 MED ORDER — EPINEPHRINE 0.3 MG/0.3ML IJ SOAJ: 0.3000 mg | Freq: Once | INTRAMUSCULAR | 2 refills | Status: AC

## 2016-07-09 MED ORDER — MONTELUKAST SODIUM 10 MG PO TABS: ORAL_TABLET | ORAL | 11 refills | Status: DC

## 2016-07-09 MED ORDER — ALBUTEROL SULFATE HFA 108 (90 BASE) MCG/ACT IN AERS: 2.0000 | INHALATION_SPRAY | RESPIRATORY_TRACT | 2 refills | Status: DC | PRN

## 2016-07-09 NOTE — Patient Instructions (Addendum)
  1. Continue Xolair to 150 mg monthly  2. Continue EpiPen and ProAir HFA if needed  3. Continue singular 10 mg daily and Allegra 180 one tablet daily if needed and can utilize nasal fluticasone one-2 sprays each nostril one time per day during periods of upper airway symptoms.  4. Return to clinic in 12 months or earlier if problem

## 2016-07-09 NOTE — Progress Notes (Signed)
Follow-up Note  Referring Provider: No ref. provider found Primary Provider: No PCP Per Patient Date of Office Visit: 07/09/2016  Subjective:   Mariah [name] (DOB: 04/06/1986) is a 31 y.o. female who [facility] in re-evaluation of the following:  HPI: Mariah [name] returns to this clinic in reevaluation of her chronic urticaria treated with omalizumab and allergic rhinitis and intermittent asthma. She has not been seen in this clinic since May 2017.  Her urticaria is under excellent control with her omalizumab. She's had no problems with her asthma. She can exercise and rarely uses a short acting bronchodilator and has not required a systemic steroid to treat an exacerbation. She's had no problems with her nose while occasionally using an antihistamine. She has not required an antibiotic to treat an episode of sinusitis.  Allergies as of 07/09/2016      Reactions   Codeine Nausea Only, Other (See Comments)   Passes out   Prednisone Nausea Only, Other (See Comments)   DIZZINESS.    Vicodin [hydrocodone-acetaminophen] Nausea Only, Other (See Comments)   Passes out      Medication List      albuterol 108 (90 Base) MCG/ACT inhaler Commonly known as:  VENTOLIN HFA Inhale 2 puffs into the lungs every 4 (four) hours as needed for wheezing or shortness of breath.   drospirenone-ethinyl estradiol 3-0.02 MG tablet Commonly known as:  YAZ,GIANVI,LORYNA Take 1 tablet by mouth daily.   EPINEPHrine 0.3 mg/0.3 mL Soaj injection Commonly known as:  EPIPEN 2-PAK Inject 0.3 mLs (0.3 mg total) into the skin once. Use as directed for life-threatening allergic reactions.   fexofenadine 180 MG tablet Commonly known as:  ALLEGRA Take 180 mg by mouth daily. Reported on 08/18/2015   fluticasone 50 MCG/ACT nasal spray Commonly known as:  FLONASE Place 2 sprays into both nostrils daily. Please dispense generic.   montelukast 10 MG tablet Commonly known as:   SINGULAIR Take one tablet once daily as directed       Past Medical History:  Diagnosis Date  . Allergy   . Anxiety state, unspecified   . Asthma   . Panic disorder without agoraphobia   . Urticaria     Past Surgical History:  Procedure Laterality Date  . TONSILLECTOMY     01/26/07- Dr. Jenne PaneBates    Review of systems negative except as noted in HPI / PMHx or noted below:  Review of Systems  Constitutional: Negative.   HENT: Negative.   Eyes: Negative.   Respiratory: Negative.   Cardiovascular: Negative.   Gastrointestinal: Negative.   Genitourinary: Negative.   Musculoskeletal: Negative.   Skin: Negative.   Neurological: Negative.   Endo/Heme/Allergies: Negative.   Psychiatric/Behavioral: Negative.      Objective:   Vitals:   07/09/16 1026  BP: 102/62  Pulse: 68  Resp: 16          Physical Exam  Constitutional: She is well-developed, well-nourished, and in no distress.  HENT:  Head: Normocephalic.  Right Ear: Tympanic membrane, external ear and ear canal normal.  Left Ear: Tympanic membrane, external ear and ear canal normal.  Nose: Nose normal. No mucosal edema or rhinorrhea.  Mouth/Throat: Uvula is midline, oropharynx is clear and moist and mucous membranes are normal. No oropharyngeal exudate.  Eyes: Conjunctivae are normal.  Neck: Trachea normal. No tracheal tenderness present. No tracheal deviation present. No thyromegaly present.  Cardiovascular: Normal rate, regular rhythm, S1 normal, S2 normal and  normal heart sounds.   No murmur heard. Pulmonary/Chest: Breath sounds normal. No stridor. No respiratory distress. She has no wheezes. She has no rales.  Musculoskeletal: She exhibits no edema.  Lymphadenopathy:       Head (right side): No tonsillar adenopathy present.       Head (left side): No tonsillar adenopathy present.    She has no cervical adenopathy.  Neurological: She is alert. Gait normal.  Skin: No rash noted. She is not diaphoretic. No  erythema. Nails show no clubbing.  Psychiatric: Mood and affect normal.    Diagnostics: none   Assessment and Plan:   1. Allergic urticaria   2. Mild intermittent asthma, uncomplicated   3. Allergic rhinoconjunctivitis     1. Continue Xolair to 150 mg monthly  2. Continue EpiPen and ProAir HFA if needed  3. Continue singular 10 mg daily and Allegra 180 one tablet daily if needed and can utilize nasal fluticasone one-2 sprays each nostril one time per day during periods of upper airway symptoms.  4. Return to clinic in 12 months or earlier if problem  Mariah [name] is really doing very well on her current therapy and I will now see her back in this clinic in 12 months or earlier if there is a problem. She has a very good understanding of her condition and how her medications work and she will contact me should she have a significant problem.  Mariah Schimke, MD [facility]

## 2016-07-23 ENCOUNTER — Ambulatory Visit (INDEPENDENT_AMBULATORY_CARE_PROVIDER_SITE_OTHER): Payer: BLUE CROSS/BLUE SHIELD

## 2016-07-23 DIAGNOSIS — L500 Allergic urticaria: Secondary | ICD-10-CM

## 2016-08-02 ENCOUNTER — Ambulatory Visit (INDEPENDENT_AMBULATORY_CARE_PROVIDER_SITE_OTHER): Payer: BLUE CROSS/BLUE SHIELD | Admitting: Family Medicine

## 2016-08-02 ENCOUNTER — Telehealth: Payer: Self-pay | Admitting: Family Medicine

## 2016-08-02 ENCOUNTER — Encounter: Payer: Self-pay | Admitting: Family Medicine

## 2016-08-02 VITALS — BP 122/84 | HR 106 | Temp 99.6°F | Ht 62.0 in | Wt 125.8 lb

## 2016-08-02 DIAGNOSIS — J029 Acute pharyngitis, unspecified: Secondary | ICD-10-CM

## 2016-08-02 DIAGNOSIS — J4521 Mild intermittent asthma with (acute) exacerbation: Secondary | ICD-10-CM

## 2016-08-02 LAB — POCT RAPID STREP A (OFFICE): Rapid Strep A Screen: NEGATIVE

## 2016-08-02 MED ORDER — HYDROCOD POLST-CPM POLST ER 10-8 MG/5ML PO SUER: 5.0000 mL | Freq: Every evening | ORAL | 0 refills | Status: DC | PRN

## 2016-08-02 MED ORDER — METHYLPREDNISOLONE 4 MG PO TABS: ORAL_TABLET | ORAL | 0 refills | Status: DC

## 2016-08-02 MED ORDER — AZITHROMYCIN 250 MG PO TABS: ORAL_TABLET | ORAL | 0 refills | Status: DC

## 2016-08-02 NOTE — Progress Notes (Signed)
Pre visit review using our clinic review tool, if applicable. No additional management support is needed unless otherwise documented below in the visit note. 

## 2016-08-02 NOTE — Telephone Encounter (Signed)
Clarified medication to pharmacy

## 2016-08-02 NOTE — Progress Notes (Signed)
History of Present Illness:    ELONNA MCFARLANE is a 31 y.o. female here for evaluation of a cough. The cough is productive of green/yellow sputum, chest is painful during coughing and is aggravated by reclining position. Onset of symptoms was 1 week ago, gradually worsening since that time.  Associated symptoms include change in voice, postnasal drip and sore throat. Patient does have a history of asthma. Patient has not had recent travel. Patient does not have a history of smoking.   PMHx, SurgHx, SocialHx, Medications, and Allergies were reviewed in the Visit Navigator and updated as appropriate.   Past Medical History:  Diagnosis Date  . Allergy   . Anxiety state, unspecified   . Asthma   . Panic disorder without agoraphobia   . Urticaria    Past Surgical History:  Procedure Laterality Date  . TONSILLECTOMY     01/26/07- Dr. Jenne Pane       Allergies  Allergen Reactions  . Codeine Nausea Only and Other (See Comments)    Passes out  . Prednisone Nausea Only and Other (See Comments)    DIZZINESS.   . Vicodin [Hydrocodone-Acetaminophen] Nausea Only and Other (See Comments)    Passes out    Prior to Admission medications   Medication Sig Start Date End Date Taking? Authorizing Provider  albuterol (VENTOLIN HFA) 108 (90 Base) MCG/ACT inhaler Inhale 2 puffs into the lungs every 4 (four) hours as needed for wheezing or shortness of breath. 07/09/16  Yes Jessica Priest, MD  drospirenone-ethinyl estradiol Pierre Bali) 3-0.02 MG tablet Take 1 tablet by mouth daily.   Yes Historical Provider, MD  fexofenadine (ALLEGRA) 180 MG tablet Take 180 mg by mouth daily. Reported on 08/18/2015   Yes Historical Provider, MD  fluticasone (FLONASE) 50 MCG/ACT nasal spray Place 2 sprays into both nostrils daily. Please dispense generic. 07/09/16  Yes Jessica Priest, MD  montelukast (SINGULAIR) 10 MG tablet Take one tablet once daily as directed 07/09/16  Yes Jessica Priest, MD     Review of Systems:     No unusual headaches, no dizziness. No dyspnea or chest pain on exertion. No abdominal pain, change in bowel habits, black or bloody stools.  No urinary tract symptoms. No new or unusual musculoskeletal symptoms. No edema.   Vitals:   08/02/16 1326  BP: 122/84  Pulse: (!) 106  Temp: 99.6 F (37.6 C)  TempSrc: Oral  SpO2: 99%  Weight: 125 lb 12.8 oz (57.1 kg)  Height: 5\' 2"  (1.575 m)     Body mass index is 23.01 kg/m.   Physical Exam:   General appearance: alert, cooperative and appears stated age HEENT: PND, OP erythema Lungs: diminished breath sounds bilaterally Heart: regular rate and rhythm, S1, S2 normal, no murmur, click, rub or gallop Abdomen: soft, non-tender; bowel sounds normal; no masses,  no organomegaly Extremities: extremities normal, atraumatic, no cyanosis or edema Pulses: 2+ and symmetric Skin: Skin color, texture, turgor normal. No rashes or lesions Neurologic: Grossly normal     Results for orders placed or performed in visit on 08/02/16  POCT rapid strep A  Result Value Ref Range   Rapid Strep A Screen Negative Negative     Assessment and Plan:    Yu was seen today for sore throat.  Diagnoses and all orders for this visit:  Acute pharyngitis, unspecified etiology -     POCT rapid strep A  Mild intermittent asthma with exacerbation -     chlorpheniramine-HYDROcodone (TUSSIONEX PENNKINETIC ER)  10-8 MG/5ML SUER; Take 5 mLs by mouth at bedtime as needed for cough. -     methylPREDNISolone (MEDROL) 4 MG tablet; 5 tab po qd x td then 4 tab po qd x td then 3 tab po qd x td then 2 tab po qd x td then 1 tab po qd -     azithromycin (ZITHROMAX Z-PAK) 250 MG tablet; Take 2 tablets ( total of 500 mg) PO on day 1, then 1 tablet ( total of 250 mg) PO q24 x 4 days.   . Reviewed expectations re: course of current medical issues. . Discussed self-management of symptoms. . Outlined signs and symptoms indicating need for more acute  intervention. . Patient verbalized understanding and all questions were answered. . See orders for this visit as documented in the electronic medical record. . Patient received an After Visit Summary.   Helane RimaErica Breean Nannini, D.O.

## 2016-08-20 DIAGNOSIS — Z793 Long term (current) use of hormonal contraceptives: Secondary | ICD-10-CM | POA: Diagnosis not present

## 2016-08-20 DIAGNOSIS — Z6822 Body mass index (BMI) 22.0-22.9, adult: Secondary | ICD-10-CM | POA: Diagnosis not present

## 2016-08-20 DIAGNOSIS — Z01419 Encounter for gynecological examination (general) (routine) without abnormal findings: Secondary | ICD-10-CM | POA: Diagnosis not present

## 2016-08-20 DIAGNOSIS — Z1389 Encounter for screening for other disorder: Secondary | ICD-10-CM | POA: Diagnosis not present

## 2016-08-20 DIAGNOSIS — Z130 Encounter for screening for diseases of the blood and blood-forming organs and certain disorders involving the immune mechanism: Secondary | ICD-10-CM | POA: Diagnosis not present

## 2016-08-21 DIAGNOSIS — L509 Urticaria, unspecified: Secondary | ICD-10-CM | POA: Diagnosis not present

## 2016-08-27 ENCOUNTER — Ambulatory Visit (INDEPENDENT_AMBULATORY_CARE_PROVIDER_SITE_OTHER): Payer: BLUE CROSS/BLUE SHIELD | Admitting: *Deleted

## 2016-08-27 DIAGNOSIS — L500 Allergic urticaria: Secondary | ICD-10-CM | POA: Diagnosis not present

## 2016-09-10 DIAGNOSIS — H5213 Myopia, bilateral: Secondary | ICD-10-CM | POA: Diagnosis not present

## 2016-09-24 ENCOUNTER — Ambulatory Visit (INDEPENDENT_AMBULATORY_CARE_PROVIDER_SITE_OTHER): Payer: BLUE CROSS/BLUE SHIELD | Admitting: *Deleted

## 2016-09-24 DIAGNOSIS — L501 Idiopathic urticaria: Secondary | ICD-10-CM

## 2016-09-24 DIAGNOSIS — L500 Allergic urticaria: Secondary | ICD-10-CM

## 2016-10-18 ENCOUNTER — Other Ambulatory Visit: Payer: Self-pay | Admitting: Allergy and Immunology

## 2016-10-23 DIAGNOSIS — L509 Urticaria, unspecified: Secondary | ICD-10-CM | POA: Diagnosis not present

## 2016-10-29 ENCOUNTER — Ambulatory Visit (INDEPENDENT_AMBULATORY_CARE_PROVIDER_SITE_OTHER): Payer: BLUE CROSS/BLUE SHIELD | Admitting: *Deleted

## 2016-10-29 DIAGNOSIS — L501 Idiopathic urticaria: Secondary | ICD-10-CM

## 2016-10-29 DIAGNOSIS — L500 Allergic urticaria: Secondary | ICD-10-CM

## 2016-11-26 ENCOUNTER — Ambulatory Visit (INDEPENDENT_AMBULATORY_CARE_PROVIDER_SITE_OTHER): Payer: BLUE CROSS/BLUE SHIELD | Admitting: *Deleted

## 2016-11-26 DIAGNOSIS — L500 Allergic urticaria: Secondary | ICD-10-CM

## 2016-12-17 DIAGNOSIS — L509 Urticaria, unspecified: Secondary | ICD-10-CM | POA: Diagnosis not present

## 2016-12-24 ENCOUNTER — Ambulatory Visit (INDEPENDENT_AMBULATORY_CARE_PROVIDER_SITE_OTHER): Payer: BLUE CROSS/BLUE SHIELD

## 2016-12-24 DIAGNOSIS — L500 Allergic urticaria: Secondary | ICD-10-CM

## 2017-01-21 ENCOUNTER — Ambulatory Visit (INDEPENDENT_AMBULATORY_CARE_PROVIDER_SITE_OTHER): Payer: BLUE CROSS/BLUE SHIELD | Admitting: *Deleted

## 2017-01-21 DIAGNOSIS — L500 Allergic urticaria: Secondary | ICD-10-CM

## 2017-01-28 ENCOUNTER — Encounter: Payer: Self-pay | Admitting: Family Medicine

## 2017-01-28 ENCOUNTER — Ambulatory Visit (INDEPENDENT_AMBULATORY_CARE_PROVIDER_SITE_OTHER): Payer: BLUE CROSS/BLUE SHIELD | Admitting: Family Medicine

## 2017-01-28 VITALS — BP 90/60 | HR 76 | Temp 98.6°F | Wt 127.4 lb

## 2017-01-28 DIAGNOSIS — N926 Irregular menstruation, unspecified: Secondary | ICD-10-CM | POA: Diagnosis not present

## 2017-01-28 DIAGNOSIS — J069 Acute upper respiratory infection, unspecified: Secondary | ICD-10-CM | POA: Insufficient documentation

## 2017-01-28 LAB — POCT URINE PREGNANCY: PREG TEST UR: NEGATIVE

## 2017-01-28 MED ORDER — AMOXICILLIN-POT CLAVULANATE 875-125 MG PO TABS: 1.0000 | ORAL_TABLET | Freq: Two times a day (BID) | ORAL | 0 refills | Status: DC

## 2017-01-28 NOTE — Patient Instructions (Addendum)
Nice to see you. You likely have a viral upper respiratory infection. Treatment for this is supportive with Flonase, Allegra, and ibuprofen if needed. You should use the Flonase and Allegra daily. You should continue the Nettie pot. I provided you with a prescription for Augmentin to fill in 2 days if you are not starting to improve. If you start taking this you should take a probiotic or yogurt. You should only fill this if your symptoms have not started to improve. If they start to worsen you should contact us. If you develop fever you should contact us. If you develop new or worsening symptoms please be reevaluated.

## 2017-01-28 NOTE — Assessment & Plan Note (Signed)
Late for menstrual cycle though she did note she had some spotting today. Urine pregnancy test is negative. Suspect she will have some irregular bleeding since she came off of continuous oral contraception. She'll monitor for her menstrual cycle.

## 2017-01-28 NOTE — Progress Notes (Signed)
Marikay Alar, MD Phone: [phone]  Mariah [name] is a 31 y.o. female who presents today for same-day visit.  Patient notes symptoms started 5 days ago. Started with sore throat and some postnasal drip. She started taking Allegra. Then developed thick sinus mucus. Her ears then became full. She notes maxillary sinus pressure. Has progressed over the last 2-3 days to where it is now. She is occasionally blowing brown mucus out of her nose. Most of the time can't get anything out of nose. She's been using a nettie pot but that hasn't been beneficial. MAXIMUM TEMPERATURE 99.8F. No cough. Notes sick contacts at work. She's tried Sudafed, ibuprofen, and Allegra. Patient additionally notes she was on continuous birth control for about 13 years until recently. She came off of it and had withdrawal bleeding and then has not had another period yet over the last month. She had minimal spotting today. She's been using condoms for birth control. She had not been having periods while she was on continuous birth control.  PMH: nonsmoker.   ROS see history of present illness  Objective  Physical Exam Vitals:   01/28/17 0822  BP: 90/60  Pulse: 76  Temp: 98.6 F (37 C)    BP Readings from Last 3 Encounters:  01/28/17 90/60  08/02/16 122/84  07/09/16 102/62   Wt Readings from Last 3 Encounters:  01/28/17 127 lb 6.4 oz (57.8 kg)  08/02/16 125 lb 12.8 oz (57.1 kg)  03/21/16 124 lb (56.2 kg)    Physical Exam  Constitutional: No distress.  HENT:  Head: Normocephalic and atraumatic.  Mild posterior oropharyngeal erythema, no exudates, TMs bilaterally  Eyes: Pupils are equal, round, and reactive to light. Conjunctivae are normal.  Neck: Neck supple.  Cardiovascular: Normal rate, regular rhythm and normal heart sounds.   Pulmonary/Chest: Effort normal and breath sounds normal.  Lymphadenopathy:    She has no cervical adenopathy.  Neurological: She is alert. Gait normal.  Skin: She is not  diaphoretic.     Assessment/Plan: Please see individual problem list.  URI (upper respiratory infection) Patient's symptoms most likely related to viral URI given short duration. She's been afebrile. She's not had second worsening. Doubt bacterial illness at this point given short duration and lack of fever and lack of significant purulent nasal discharge. Benign exam with the exception of slight erythema of her posterior oropharynx. Discussed that an antibiotic would not be beneficial at this point. Discussed that most patients with viral illnesses start to improve within 5-7 days of the start of the illness. Discussed that supportive care is treatment of choice and that viral illnesses typically run their course over about 7-10 days. Advised on Flonase and Allegra. Continue ibuprofen. Continue Nettie pot. She was provided with a prescription for Augmentin to take if her symptoms do not start to improve over the next 2-3 days (she would be around day 7 of symptoms at that time). She was advised to only fill this if her symptoms did not improve. If they worsen or she develops fevers she needs to let us know.  Missed period Late for menstrual cycle though she did note she had some spotting today. Urine pregnancy test is negative. Suspect she will have some irregular bleeding since she came off of continuous oral contraception. She'll monitor for her menstrual cycle.   Orders Placed This Encounter  Procedures  . POCT urine pregnancy    Meds ordered this encounter  Medications  . amoxicillin-clavulanate (AUGMENTIN) 875-125 MG tablet  Sig: Take 1 tablet by mouth 2 (two) times daily. Do not fill until 01/30/17, do not fill after 02/04/17    Dispense:  14 tablet    Refill:  0    Marikay AlarEric Sonnenberg, MD [facility] [place]

## 2017-01-28 NOTE — Assessment & Plan Note (Addendum)
Patient's symptoms most likely related to viral URI given short duration. She's been afebrile. She's not had second worsening. Doubt bacterial illness at this point given short duration and lack of fever and lack of significant purulent nasal discharge. Benign exam with the exception of slight erythema of her posterior oropharynx. Discussed that an antibiotic would not be beneficial at this point. Discussed that most patients with viral illnesses start to improve within 5-7 days of the start of the illness. Discussed that supportive care is treatment of choice and that viral illnesses typically run their course over about 7-10 days. Advised on Flonase and Allegra. Continue ibuprofen. Continue Nettie pot. She was provided with a prescription for Augmentin to take if her symptoms do not start to improve over the next 2-3 days (she would be around day 7 of symptoms at that time). She was advised to only fill this if her symptoms did not improve. If they worsen or she develops fevers she needs to let us know.

## 2017-02-10 DIAGNOSIS — L509 Urticaria, unspecified: Secondary | ICD-10-CM | POA: Diagnosis not present

## 2017-02-18 ENCOUNTER — Ambulatory Visit (INDEPENDENT_AMBULATORY_CARE_PROVIDER_SITE_OTHER): Payer: BLUE CROSS/BLUE SHIELD | Admitting: *Deleted

## 2017-02-18 DIAGNOSIS — L501 Idiopathic urticaria: Secondary | ICD-10-CM

## 2017-03-18 ENCOUNTER — Ambulatory Visit (INDEPENDENT_AMBULATORY_CARE_PROVIDER_SITE_OTHER): Payer: BLUE CROSS/BLUE SHIELD | Admitting: *Deleted

## 2017-03-18 DIAGNOSIS — L500 Allergic urticaria: Secondary | ICD-10-CM

## 2017-03-18 NOTE — Progress Notes (Signed)
Patient states she is trying to get pregnant and this will be her last xolair for a while. She will contact us when she wants to restart but for now today is her last one.

## 2017-04-29 DIAGNOSIS — L739 Follicular disorder, unspecified: Secondary | ICD-10-CM | POA: Diagnosis not present

## 2017-04-29 DIAGNOSIS — D1801 Hemangioma of skin and subcutaneous tissue: Secondary | ICD-10-CM | POA: Diagnosis not present

## 2017-04-29 DIAGNOSIS — D225 Melanocytic nevi of trunk: Secondary | ICD-10-CM | POA: Diagnosis not present

## 2017-04-29 DIAGNOSIS — L814 Other melanin hyperpigmentation: Secondary | ICD-10-CM | POA: Diagnosis not present

## 2017-05-16 DIAGNOSIS — Z23 Encounter for immunization: Secondary | ICD-10-CM | POA: Diagnosis not present

## 2017-05-27 ENCOUNTER — Other Ambulatory Visit: Payer: Self-pay | Admitting: Ophthalmology

## 2017-05-27 DIAGNOSIS — H4710 Unspecified papilledema: Secondary | ICD-10-CM | POA: Diagnosis not present

## 2017-06-02 ENCOUNTER — Ambulatory Visit: Admission: RE | Admit: 2017-06-02 | Payer: BLUE CROSS/BLUE SHIELD | Source: Ambulatory Visit

## 2017-06-10 ENCOUNTER — Ambulatory Visit
Admission: RE | Admit: 2017-06-10 | Discharge: 2017-06-10 | Disposition: A | Discharge status: Home / Self Care | Payer: BLUE CROSS/BLUE SHIELD | Source: Ambulatory Visit | Attending: Ophthalmology | Admitting: Ophthalmology

## 2017-06-10 DIAGNOSIS — H4710 Unspecified papilledema: Secondary | ICD-10-CM | POA: Insufficient documentation

## 2017-06-10 MED ORDER — GADOBENATE DIMEGLUMINE 529 MG/ML IV SOLN
10.0000 mL | Freq: Once | INTRAVENOUS | Status: AC | PRN
  Administered 2017-06-10: 10 mL via INTRAVENOUS

## 2017-08-14 DIAGNOSIS — F4323 Adjustment disorder with mixed anxiety and depressed mood: Secondary | ICD-10-CM | POA: Diagnosis not present

## 2017-09-02 DIAGNOSIS — F4323 Adjustment disorder with mixed anxiety and depressed mood: Secondary | ICD-10-CM | POA: Diagnosis not present

## 2017-09-16 ENCOUNTER — Encounter (HOSPITAL_COMMUNITY): Payer: Self-pay | Admitting: *Deleted

## 2017-09-16 ENCOUNTER — Emergency Department (HOSPITAL_COMMUNITY)
Admission: EM | Admit: 2017-09-16 | Discharge: 2017-09-16 | Disposition: A | Discharge status: Home / Self Care | Payer: BLUE CROSS/BLUE SHIELD | Attending: Emergency Medicine | Admitting: Emergency Medicine

## 2017-09-16 DIAGNOSIS — R197 Diarrhea, unspecified: Secondary | ICD-10-CM | POA: Diagnosis not present

## 2017-09-16 DIAGNOSIS — F419 Anxiety disorder, unspecified: Secondary | ICD-10-CM | POA: Insufficient documentation

## 2017-09-16 DIAGNOSIS — R112 Nausea with vomiting, unspecified: Secondary | ICD-10-CM

## 2017-09-16 DIAGNOSIS — Z79899 Other long term (current) drug therapy: Secondary | ICD-10-CM | POA: Diagnosis not present

## 2017-09-16 DIAGNOSIS — J45909 Unspecified asthma, uncomplicated: Secondary | ICD-10-CM | POA: Diagnosis not present

## 2017-09-16 DIAGNOSIS — Z3202 Encounter for pregnancy test, result negative: Secondary | ICD-10-CM | POA: Diagnosis not present

## 2017-09-16 LAB — URINALYSIS, ROUTINE W REFLEX MICROSCOPIC
BACTERIA UA: NONE SEEN
BILIRUBIN URINE: NEGATIVE
Glucose, UA: NEGATIVE mg/dL
Ketones, ur: 5 mg/dL — AB
LEUKOCYTES UA: NEGATIVE
Nitrite: NEGATIVE
Protein, ur: NEGATIVE mg/dL
SPECIFIC GRAVITY, URINE: 1.012 (ref 1.005–1.030)
pH: 7 (ref 5.0–8.0)

## 2017-09-16 LAB — CBC
HEMATOCRIT: 41.7 % (ref 36.0–46.0)
Hemoglobin: 13.4 g/dL (ref 12.0–15.0)
MCH: 26.8 pg (ref 26.0–34.0)
MCHC: 32.1 g/dL (ref 30.0–36.0)
MCV: 83.4 fL (ref 78.0–100.0)
Platelets: 300 10*3/uL (ref 150–400)
RBC: 5 MIL/uL (ref 3.87–5.11)
RDW: 12.9 % (ref 11.5–15.5)
WBC: 8.7 10*3/uL (ref 4.0–10.5)

## 2017-09-16 LAB — COMPREHENSIVE METABOLIC PANEL
ALBUMIN: 4.1 g/dL (ref 3.5–5.0)
ALT: 19 U/L (ref 14–54)
AST: 22 U/L (ref 15–41)
Alkaline Phosphatase: 44 U/L (ref 38–126)
Anion gap: 12 (ref 5–15)
BUN: 7 mg/dL (ref 6–20)
CHLORIDE: 102 mmol/L (ref 101–111)
CO2: 22 mmol/L (ref 22–32)
Calcium: 8.9 mg/dL (ref 8.9–10.3)
Creatinine, Ser: 0.86 mg/dL (ref 0.44–1.00)
GFR calc Af Amer: >60 mL/min (ref >60)
GFR calc non Af Amer: >60 mL/min (ref >60)
GLUCOSE: 107 mg/dL — AB (ref 65–99)
POTASSIUM: 3.8 mmol/L (ref 3.5–5.1)
Sodium: 136 mmol/L (ref 135–145)
Total Bilirubin: 0.7 mg/dL (ref 0.3–1.2)
Total Protein: 6.9 g/dL (ref 6.5–8.1)

## 2017-09-16 LAB — I-STAT BETA HCG BLOOD, ED (MC, WL, AP ONLY)

## 2017-09-16 LAB — LIPASE, BLOOD: LIPASE: 71 U/L — AB (ref 11–51)

## 2017-09-16 MED ORDER — SODIUM CHLORIDE 0.9 % IV BOLUS
1000.0000 mL | Freq: Once | INTRAVENOUS | Status: AC
  Administered 2017-09-16: 1000 mL via INTRAVENOUS

## 2017-09-16 MED ORDER — ONDANSETRON HCL 4 MG/2ML IJ SOLN
4.0000 mg | Freq: Once | INTRAMUSCULAR | Status: AC
  Administered 2017-09-16: 4 mg via INTRAVENOUS
  Filled 2017-09-16: qty 2

## 2017-09-16 MED ORDER — PROMETHAZINE HCL 25 MG RE SUPP: 25.0000 mg | Freq: Four times a day (QID) | RECTAL | 0 refills | Status: DC | PRN

## 2017-09-16 NOTE — ED Notes (Signed)
E-signature not functioning at this time.  

## 2017-09-16 NOTE — ED Notes (Signed)
NS Bolus complete. 1000 ml.

## 2017-09-16 NOTE — ED Notes (Signed)
Patient ambulated to restroom independently. No distress noted.

## 2017-09-16 NOTE — ED Triage Notes (Signed)
To ED for eval for past week after she and her husband separated. States she is unable to sleep as well. Recent prescribed Lexapro (2 days ago) but unable to keep it down. Pt is calm and cooperative. Denies SI/HI. States she just feels exhausted.

## 2017-09-16 NOTE — Discharge Instructions (Addendum)
Please read instructions below. Drink clear liquids until your stomach feels better. Then, slowly introduce bland foods into your diet as tolerated, such as bread, rice, apples, bananas. You can take your zofran every 8 hours as needed for nausea. OR you can use phenergan suppository if needed. Follow up with your primary care/gynecologist if symptoms persist. Return to the ER for thoughts or feelings of harming yourself or others, severe abdominal pain, fever, uncontrollable vomiting, or new or concerning symptoms.

## 2017-09-16 NOTE — ED Notes (Addendum)
Patient states that since she separated from spouse, she has not been eating, sleeping, checking blood sugar (reports that she becomes hypoglycemic, but does not check blood sugar). Prescribed Lexapro and Xanax by [facility], but states she stopped taking those medications.

## 2017-09-16 NOTE — ED Provider Notes (Signed)
[facility]   CSN: [id number] Arrival date & time: 09/16/17  0708     History   Chief Complaint Chief Complaint  Patient presents with  . Emesis  . Diarrhea    HPI Mariah [name] is a 32 y.o. female past medical history of anxiety, asthma, presenting to the ED with nausea, vomiting, and diarrhea times 1 week.  Patient states she recently separated from her spouse about 6 weeks ago, and has been feeling very stressed since that time.  She states her gynecologist prescribed her Lexapro and Xanax which helped initially, however her vomiting has made it difficult for her to keep her medications down.  She denies suicidal or homicidal ideation.  She reports her upper abdomen feels sore, however pain is not severe.  She reports taking Zofran at home with relief of symptoms, however became unable to keep it down.  No sick contacts, denies urinary symptoms, or other complaints. Relates her symptoms to high level of stress.  The history is provided by the patient.    Past Medical History:  Diagnosis Date  . Allergy   . Anxiety state, unspecified   . Asthma   . Panic disorder without agoraphobia   . Urticaria     Patient Active Problem List   Diagnosis Date Noted  . URI (upper respiratory infection) 01/28/2017  . Missed period 01/28/2017  . Encounter to establish care 05/04/2015  . Motion sickness 05/04/2015  . Chronic urticaria 02/25/2015  . Mild intermittent asthma 02/25/2015  . Rash and nonspecific skin eruption 09/21/2012  . Left knee pain 08/02/2011  . Contraception management 05/13/2011  . PAP SMEAR, ABNORMAL 04/27/2009  . SYNCOPE 02/08/2009  . ALLERGIC RHINITIS 12/21/2008  . Asthma, chronic 08/18/2007  . Generalized anxiety disorder 03/11/2007  . PANIC ATTACK 03/11/2007    Past Surgical History:  Procedure Laterality Date  . TONSILLECTOMY     01/26/07- Dr. Jenne PaneBates     OB History   None      Home Medications      Prior to Admission medications   Medication Sig Start Date End Date Taking? Authorizing Provider  albuterol (VENTOLIN HFA) 108 (90 Base) MCG/ACT inhaler Inhale 2 puffs into the lungs every 4 (four) hours as needed for wheezing or shortness of breath. 07/09/16   Kozlow, Alvira PhilipsEric J, MD  amoxicillin-clavulanate (AUGMENTIN) 875-125 MG tablet Take 1 tablet by mouth 2 (two) times daily. Do not fill until 01/30/17, do not fill after 02/04/17 01/28/17   Glori LuisSonnenberg, Eric G, MD  drospirenone-ethinyl estradiol Pierre Bali(YAZ,GIANVI,LORYNA) 3-0.02 MG tablet Take 1 tablet by mouth daily.    [provider]  fexofenadine (ALLEGRA) 180 MG tablet Take 180 mg by mouth daily. Reported on 08/18/2015    [provider]  fluticasone (FLONASE) 50 MCG/ACT nasal spray Place 2 sprays into both nostrils daily. Please dispense generic. 07/09/16   Kozlow, Alvira PhilipsEric J, MD  promethazine (PHENERGAN) 25 MG suppository Place 1 suppository (25 mg total) rectally every 6 (six) hours as needed for nausea or vomiting. 09/16/17   Oron Westrup, [place] N, PA-C  XOLAIR 150 MG injection INJECT 300MG  SUBCUTANEOUSLY EVERY 4 WEEKS 10/18/16   Kozlow, Alvira PhilipsEric J, MD    Family History Family History  Problem Relation Age of Onset  . Hypertension Father   . Depression Mother   . Diabetes Unknown   . Cancer Unknown   . Stroke Unknown     Social History Social History   Tobacco Use  . Smoking  status: Never Smoker  . Smokeless tobacco: Never Used  Substance Use Topics  . Alcohol use: Yes    Alcohol/week: 0.0 oz    Comment: very rarely  . Drug use: No     Allergies   Codeine; Prednisone; and Vicodin [hydrocodone-acetaminophen]   Review of Systems Review of Systems  Constitutional: Negative for chills and fever.  Gastrointestinal: Positive for abdominal pain, diarrhea, nausea and vomiting. Negative for blood in stool.  Genitourinary: Negative for dysuria and frequency.  Psychiatric/Behavioral: Positive for dysphoric mood. Negative for  self-injury and suicidal ideas. The patient is nervous/anxious.   All other systems reviewed and are negative.    Physical Exam Updated Vital Signs BP 133/90 (BP Location: Right Arm)   Pulse 78   Temp 98.5 F (36.9 C) (Oral)   Resp 18   LMP 08/31/2017 (Approximate)   SpO2 99%   Physical Exam  Constitutional: She is oriented to person, place, and time. She appears well-developed and well-nourished. No distress.  Calm and cooperative. Not ill-appearing.  HENT:  Head: Normocephalic and atraumatic.  Eyes: Conjunctivae are normal.  Cardiovascular: Normal rate, regular rhythm, normal heart sounds and intact distal pulses.  Pulmonary/Chest: Effort normal and breath sounds normal. No respiratory distress.  Abdominal: Soft. Bowel sounds are normal. She exhibits no distension. There is no tenderness. There is no rebound and no guarding.  Neurological: She is alert and oriented to person, place, and time.  Skin: Skin is warm.  Psychiatric: Her speech is normal and behavior is normal. She expresses no homicidal and no suicidal ideation.  Flat affect, though calm and cooperative.   Nursing note and vitals reviewed.    ED Treatments / Results  Labs (all labs ordered are listed, but only abnormal results are displayed) Labs Reviewed  LIPASE, BLOOD - Abnormal; Notable for the following components:      Result Value   Lipase 71 (*)    All other components within normal limits  COMPREHENSIVE METABOLIC PANEL - Abnormal; Notable for the following components:   Glucose, Bld 107 (*)    All other components within normal limits  URINALYSIS, ROUTINE W REFLEX MICROSCOPIC - Abnormal; Notable for the following components:   Hgb urine dipstick SMALL (*)    Ketones, ur 5 (*)    Squamous Epithelial / LPF 0-5 (*)    All other components within normal limits  CBC  I-STAT BETA HCG BLOOD, ED ([facility], [facility], [facility] ONLY)    EKG None  Radiology No results found.  Procedures Procedures (including critical  care time)  Medications Ordered in ED Medications  sodium chloride 0.9 % bolus 1,000 mL (1,000 mLs Intravenous Given 09/16/17 0923)  ondansetron (ZOFRAN) injection 4 mg (4 mg Intravenous Given 09/16/17 0924)     Initial Impression / Assessment and Plan / ED Course  I have reviewed the triage vital signs and the nursing notes.  Pertinent labs & imaging results that were available during my care of the patient were reviewed by me and considered in my medical decision making (see chart for details).  Clinical Course as of Sep 17 1123  Tue Sep 16, 2017  1058 Pt states she feels better. Is tolerating PO fluids. Will discharge with symptomatic management and instructions to follow up with PCP.    [JR]    Clinical Course User Index [JR] Kylie Gros, [place] N, PA-C    Patient presenting with nausea, vomiting, and diarrhea for 1 week, likely 2/t to anxiety vs viral etiology.  Patient states she  has had high stress level the past 6 weeks since her and her husband separated.  She states she has been prescribed Xanax and Lexapro for symptoms, however began unable to keep it down.  Denies any significant abdominal pain, fever or chills, urinary symptoms.  Denies suicidal or homicidal ideation.  On exam, abdomen is benign, patient is nontoxic appearing.  CMP and CBC unremarkable.  Urine negative for infection.  Negative pregnancy.  Symptoms treated in the ED with Zofran and IV fluids with relief.  Patient tolerating p.o. fluids prior to discharge.  Discussed importance of follow-up, and strict return precautions.  Patient verbalized understanding and agrees with care plan.  Discussed results, findings, treatment and follow up. Patient advised of return precautions. Patient verbalized understanding and agreed with plan.  Final Clinical Impressions(s) / ED Diagnoses   Final diagnoses:  Nausea vomiting and diarrhea  Anxiety    ED Discharge Orders        Ordered    promethazine (PHENERGAN) 25 MG  suppository  Every 6 hours PRN     09/16/17 1106       Terresa Marlett, [place] N, [place] 09/16/17 1125    Margarita Grizzle, MD 09/16/17 [phone]

## 2017-09-20 ENCOUNTER — Other Ambulatory Visit: Payer: Self-pay | Admitting: Allergy and Immunology

## 2017-09-23 DIAGNOSIS — Z113 Encounter for screening for infections with a predominantly sexual mode of transmission: Secondary | ICD-10-CM | POA: Diagnosis not present

## 2017-09-23 DIAGNOSIS — F4323 Adjustment disorder with mixed anxiety and depressed mood: Secondary | ICD-10-CM | POA: Diagnosis not present

## 2017-09-23 DIAGNOSIS — Z202 Contact with and (suspected) exposure to infections with a predominantly sexual mode of transmission: Secondary | ICD-10-CM | POA: Diagnosis not present

## 2017-09-29 ENCOUNTER — Ambulatory Visit: Payer: BLUE CROSS/BLUE SHIELD | Admitting: Primary Care

## 2017-09-30 DIAGNOSIS — F4323 Adjustment disorder with mixed anxiety and depressed mood: Secondary | ICD-10-CM | POA: Diagnosis not present

## 2017-10-07 DIAGNOSIS — F4323 Adjustment disorder with mixed anxiety and depressed mood: Secondary | ICD-10-CM | POA: Diagnosis not present

## 2017-10-07 DIAGNOSIS — F329 Major depressive disorder, single episode, unspecified: Secondary | ICD-10-CM | POA: Diagnosis not present

## 2017-10-14 DIAGNOSIS — F4323 Adjustment disorder with mixed anxiety and depressed mood: Secondary | ICD-10-CM | POA: Diagnosis not present

## 2017-10-21 DIAGNOSIS — F4323 Adjustment disorder with mixed anxiety and depressed mood: Secondary | ICD-10-CM | POA: Diagnosis not present

## 2017-11-04 ENCOUNTER — Encounter: Payer: Self-pay | Admitting: Allergy and Immunology

## 2017-11-04 ENCOUNTER — Ambulatory Visit: Payer: BLUE CROSS/BLUE SHIELD | Admitting: Allergy and Immunology

## 2017-11-04 VITALS — BP 112/70 | HR 73 | Temp 98.8°F | Resp 16

## 2017-11-04 DIAGNOSIS — L500 Allergic urticaria: Secondary | ICD-10-CM

## 2017-11-04 DIAGNOSIS — J3089 Other allergic rhinitis: Secondary | ICD-10-CM

## 2017-11-04 DIAGNOSIS — J4520 Mild intermittent asthma, uncomplicated: Secondary | ICD-10-CM

## 2017-11-04 DIAGNOSIS — F4323 Adjustment disorder with mixed anxiety and depressed mood: Secondary | ICD-10-CM | POA: Diagnosis not present

## 2017-11-04 MED ORDER — ALBUTEROL SULFATE HFA 108 (90 BASE) MCG/ACT IN AERS: 2.0000 | INHALATION_SPRAY | RESPIRATORY_TRACT | 2 refills | Status: DC | PRN

## 2017-11-04 NOTE — Patient Instructions (Addendum)
  1. Continue ProAir HFA and OTC antihistamine if needed  2. Return to clinic in 12 months or earlier if problem

## 2017-11-04 NOTE — Progress Notes (Signed)
Follow-up Note  Referring Provider: No ref. provider found Primary Provider: Patient, No Pcp Per Date of Office Visit: 11/04/2017  Subjective:   Mariah [name] (DOB: 1985/11/09) is a 32 y.o. female who [facility] in re-evaluation of the following:  HPI: Mariah [name] presents to this clinic in evaluation of chronic urticaria and history of allergic rhinitis and history of intermittent asthma.  Her last visit to this clinic was 09 August 2016.  She discontinued her Xolair and other medications for chronic urticaria about 8 months ago in anticipation of becoming pregnant.  She never became pregnant but she did discover that her husband was having an affair and as of February 2019 she is now separated in anticipation of obtaining a divorce.  Over the past 8 months she has not had any chronic urticaria and she does not need to use any antihistamines.  She is using occasional antihistamine for some pollen allergies involving her nose.  Rarely does she use a short acting bronchodilator at this point in time.  Allergies as of 11/04/2017      Reactions   Codeine Nausea Only, Other (See Comments)   Passes out   Prednisone Nausea Only, Other (See Comments)   DIZZINESS.    Vicodin [hydrocodone-acetaminophen] Nausea Only, Other (See Comments)   Passes out      Medication List      albuterol 108 (90 Base) MCG/ACT inhaler Commonly known as:  VENTOLIN HFA Inhale 2 puffs into the lungs every 4 (four) hours as needed for wheezing or shortness of breath.   drospirenone-ethinyl estradiol 3-0.02 MG tablet Commonly known as:  YAZ,GIANVI,LORYNA Take 1 tablet by mouth daily.   escitalopram 10 MG tablet Commonly known as:  LEXAPRO Take 10 mg by mouth daily.   fexofenadine 180 MG tablet Commonly known as:  ALLEGRA Take 180 mg by mouth daily. Reported on 08/18/2015   fluticasone 50 MCG/ACT nasal spray Commonly known as:  FLONASE Place 2 sprays into both  nostrils daily. Please dispense generic.   XOLAIR 150 MG injection Generic drug:  omalizumab INJECT  SUBCUTANEOUSLY EVERY 4 WEEKS       Past Medical History:  Diagnosis Date  . Allergy   . Anxiety state, unspecified   . Asthma   . Panic disorder without agoraphobia   . Urticaria     Past Surgical History:  Procedure Laterality Date  . TONSILLECTOMY     01/26/07- Dr. Jenne Pane    Review of systems negative except as noted in HPI / PMHx or noted below:  Review of Systems  Constitutional: Negative.   HENT: Negative.   Eyes: Negative.   Respiratory: Negative.   Cardiovascular: Negative.   Gastrointestinal: Negative.   Genitourinary: Negative.   Musculoskeletal: Negative.   Skin: Negative.   Neurological: Negative.   Endo/Heme/Allergies: Negative.   Psychiatric/Behavioral: Negative.      Objective:   Vitals:   11/04/17 1629  BP: 112/70  Pulse: 73  Resp: 16  Temp: 98.8 F (37.1 C)  SpO2: 98%          Physical Exam  HENT:  Head: Normocephalic.  Right Ear: Tympanic membrane, external ear and ear canal normal.  Left Ear: Tympanic membrane, external ear and ear canal normal.  Nose: Nose normal. No mucosal edema or rhinorrhea.  Mouth/Throat: Uvula is midline, oropharynx is clear and moist and mucous membranes are normal. No oropharyngeal exudate.  Eyes: Conjunctivae are normal.  Neck: Trachea normal. No  tracheal tenderness present. No tracheal deviation present. No thyromegaly present.  Cardiovascular: Normal rate, regular rhythm, S1 normal, S2 normal and normal heart sounds.  No murmur heard. Pulmonary/Chest: Breath sounds normal. No stridor. No respiratory distress. She has no wheezes. She has no rales.  Musculoskeletal: She exhibits no edema.  Lymphadenopathy:       Head (right side): No tonsillar adenopathy present.       Head (left side): No tonsillar adenopathy present.    She has no cervical adenopathy.  Neurological: She is alert.  Skin: No rash  noted. She is not diaphoretic. No erythema. Nails show no clubbing.    Diagnostics: none  Assessment and Plan:   1. Allergic urticaria   2. Mild intermittent asthma, uncomplicated   3. Other allergic rhinitis     1. Continue ProAir HFA and OTC antihistamine if needed  2. Return to clinic in 12 months or earlier if problem  Geneal appears to be doing very well at this point time without the use of Xolair or her other medications regarding her chronic urticaria.  Hopefully this immunological change will be permanent.  She has the option of using pro-air HFA if needed an over-the-counter antihistamine if needed.  I will see her back in this clinic in 1 year or earlier if there is a problem.  Laurette Schimke, MD Allergy / Immunology [facility]

## 2017-11-05 ENCOUNTER — Encounter: Payer: Self-pay | Admitting: Allergy and Immunology

## 2017-12-12 DIAGNOSIS — Z130 Encounter for screening for diseases of the blood and blood-forming organs and certain disorders involving the immune mechanism: Secondary | ICD-10-CM | POA: Diagnosis not present

## 2017-12-12 DIAGNOSIS — Z6822 Body mass index (BMI) 22.0-22.9, adult: Secondary | ICD-10-CM | POA: Diagnosis not present

## 2017-12-12 DIAGNOSIS — Z113 Encounter for screening for infections with a predominantly sexual mode of transmission: Secondary | ICD-10-CM | POA: Diagnosis not present

## 2017-12-12 DIAGNOSIS — Z1389 Encounter for screening for other disorder: Secondary | ICD-10-CM | POA: Diagnosis not present

## 2017-12-12 DIAGNOSIS — Z01419 Encounter for gynecological examination (general) (routine) without abnormal findings: Secondary | ICD-10-CM | POA: Diagnosis not present

## 2017-12-16 DIAGNOSIS — H47332 Pseudopapilledema of optic disc, left eye: Secondary | ICD-10-CM | POA: Diagnosis not present

## 2018-04-28 DIAGNOSIS — D225 Melanocytic nevi of trunk: Secondary | ICD-10-CM | POA: Diagnosis not present

## 2018-04-28 DIAGNOSIS — D2262 Melanocytic nevi of left upper limb, including shoulder: Secondary | ICD-10-CM | POA: Diagnosis not present

## 2018-04-28 DIAGNOSIS — L814 Other melanin hyperpigmentation: Secondary | ICD-10-CM | POA: Diagnosis not present

## 2018-04-28 DIAGNOSIS — T22112A Burn of first degree of left forearm, initial encounter: Secondary | ICD-10-CM | POA: Diagnosis not present

## 2018-06-26 ENCOUNTER — Ambulatory Visit
Admission: EM | Admit: 2018-06-26 | Discharge: 2018-06-26 | Disposition: A | Discharge status: Home / Self Care | Payer: BLUE CROSS/BLUE SHIELD | Attending: Emergency Medicine | Admitting: Emergency Medicine

## 2018-06-26 DIAGNOSIS — J0100 Acute maxillary sinusitis, unspecified: Secondary | ICD-10-CM | POA: Insufficient documentation

## 2018-06-26 MED ORDER — AMOXICILLIN-POT CLAVULANATE 875-125 MG PO TABS: 1.0000 | ORAL_TABLET | Freq: Two times a day (BID) | ORAL | 0 refills | Status: AC

## 2018-06-26 NOTE — ED Provider Notes (Signed)
[facility]   [id number] 06/26/18 Arrival Time: 1000   CC: URI symptoms   SUBJECTIVE: History from: patient.  DVORA ROESNER is a 33 y.o. female who presents with abrupt onset of nasal congestion, runny nose, sinus pressure/ pain, sore throat, and dry cough for 5 days. States symptoms were improving, but now worse.   Admits to sick exposure at work.  Has tried OTC medications without relief.  Reports previous symptoms in the past. Complains of low grade fever, and fatigue.  Denies chills, SOB, wheezing, chest pain, nausea, changes in bowel or bladder habits.   ROS: As per HPI.  Past Medical History:  Diagnosis Date  . Allergy   . Anxiety state, unspecified   . Asthma   . Panic disorder without agoraphobia   . Urticaria    Past Surgical History:  Procedure Laterality Date  . TONSILLECTOMY     01/26/07- Dr. Jenne Pane   Allergies  Allergen Reactions  . Codeine Nausea Only and Other (See Comments)    Passes out  . Prednisone Nausea Only and Other (See Comments)    DIZZINESS.   . Vicodin [Hydrocodone-Acetaminophen] Nausea Only and Other (See Comments)    Passes out   Current Facility-Administered Medications on File Prior to Encounter  Medication Dose Route Frequency Provider Last Rate Last Dose  . omalizumab Geoffry Paradise) injection 150 mg  150 mg Subcutaneous Q28 days Jessica Priest, MD   150 mg at 03/18/17 1037   Current Outpatient Medications on File Prior to Encounter  Medication Sig Dispense Refill  . albuterol (VENTOLIN HFA) 108 (90 Base) MCG/ACT inhaler Inhale 2 puffs into the lungs every 4 (four) hours as needed for wheezing or shortness of breath. 1 Inhaler 2  . drospirenone-ethinyl estradiol (YAZ,GIANVI,LORYNA) 3-0.02 MG tablet Take 1 tablet by mouth daily.    Marland Kitchen escitalopram (LEXAPRO) 10 MG tablet Take 10 mg by mouth daily.    . fexofenadine (ALLEGRA) 180 MG tablet Take 180 mg by mouth daily. Reported on 08/18/2015    . fluticasone (FLONASE) 50 MCG/ACT nasal spray  Place 2 sprays into both nostrils daily. Please dispense generic. 16 g 5  . XOLAIR 150 MG injection INJECT 300MG  SUBCUTANEOUSLY EVERY 4 WEEKS 2 each 11   Social History   Socioeconomic History  . Marital status: Legally Separated    Spouse name: Not on file  . Number of children: 0  . Years of education: Not on file  . Highest education level: Not on file  Occupational History  . Occupation: Associate Professor    Comment: [employer]     Comment:    Social Needs  . Financial resource strain: Not on file  . Food insecurity:    Worry: Not on file    Inability: Not on file  . Transportation needs:    Medical: Not on file    Non-medical: Not on file  Tobacco Use  . Smoking status: Never Smoker  . Smokeless tobacco: Never Used  Substance and Sexual Activity  . Alcohol use: Yes    Alcohol/week: 0.0 standard drinks    Comment: very rarely  . Drug use: No  . Sexual activity: Yes  Lifestyle  . Physical activity:    Days per week: Not on file    Minutes per session: Not on file  . Stress: Not on file  Relationships  . Social connections:    Talks on phone: Not on file    Gets together: Not on file    Attends  religious service: Not on file    Active member of club or organization: Not on file    Attends meetings of clubs or organizations: Not on file    Relationship status: Not on file  . Intimate partner violence:    Fear of current or ex partner: Not on file    Emotionally abused: Not on file    Physically abused: Not on file    Forced sexual activity: Not on file  Other Topics Concern  . Not on file  Social History Narrative             Family History  Problem Relation Age of Onset  . Hypertension Father   . Depression Mother   . Diabetes Other   . Cancer Other   . Stroke Other     OBJECTIVE:  Vitals:   06/26/18 1009  BP: 124/88  Pulse: 86  Resp: 18  Temp: 98 F (36.7 C)  TempSrc: Oral  SpO2: 99%     General appearance: alert; appears mildly fatigued,  but nontoxic; speaking in full sentences and tolerating own secretions HEENT: NCAT; Ears: EACs clear, TMs pearly gray; Eyes: PERRL.  EOM grossly intact. Sinuses: frontal and maxillary sinus tenderness; Nose: nares patent without rhinorrhea, Throat: oropharynx clear, tonsils non erythematous or enlarged, uvula midline  Neck: supple without LAD Lungs: unlabored respirations, symmetrical air entry; cough: absent; no respiratory distress; CTAB Heart: regular rate and rhythm.  Radial pulses 2+ symmetrical bilaterally Skin: warm and dry Psychological: alert and cooperative; normal mood and affect  ASSESSMENT & PLAN:  1. Acute non-recurrent maxillary sinusitis     Meds ordered this encounter  Medications  . amoxicillin-clavulanate (AUGMENTIN) 875-125 MG tablet    Sig: Take 1 tablet by mouth every 12 (twelve) hours for 10 days.    Dispense:  20 tablet    Refill:  0    Order Specific Question:   Supervising Provider    Answer:   Eustace Moore [[id number]]    Get plenty of rest and push fluids Tessalon Perles prescribed for cough Zyrtec-D prescribed for nasal congestion, runny nose, and/or sore throat Flonase prescribed for nasal congestion and runny nose Use medications daily for symptom relief Use OTC medications like ibuprofen or tylenol as needed fever or pain Follow up with PCP if symptoms persist Return or go to ER if you have any new or worsening symptoms fever, chills, nausea, vomiting, chest pain, cough, shortness of breath, wheezing, abdominal pain, changes in bowel or bladder habits, etc...  Reviewed expectations re: course of current medical issues. Questions answered. Outlined signs and symptoms indicating need for more acute intervention. Patient verbalized understanding. After Visit Summary given.         Rennis Harding, PA-C 06/26/18 1249

## 2018-06-26 NOTE — Discharge Instructions (Addendum)
Rest and push fluids Continue with OTC mucinex and sudafed as needed for symptomatic relief Augmentin prescribed.  Take as directed and to completion Continue with OTC ibuprofen/tylenol as needed for pain Follow up with PCP or Joaquin CourtsKimberly Harris FNP if symptoms persists Return or go to the ED if you have any new or worsening symptoms such as fever, chills, worsening sinus pain/pressure, cough, sore throat, chest pain, shortness of breath, abdominal pain, changes in bowel or bladder habits, etc...Marland Kitchen

## 2018-06-26 NOTE — ED Triage Notes (Signed)
Pt c/o head cold sx's with nonproductive cough since Sunday. Taking OTC meds with no relief

## 2018-08-24 ENCOUNTER — Ambulatory Visit
Admission: EM | Admit: 2018-08-24 | Discharge: 2018-08-24 | Disposition: A | Discharge status: Home / Self Care | Payer: BLUE CROSS/BLUE SHIELD | Attending: Emergency Medicine | Admitting: Emergency Medicine

## 2018-08-24 ENCOUNTER — Ambulatory Visit (INDEPENDENT_AMBULATORY_CARE_PROVIDER_SITE_OTHER): Payer: BLUE CROSS/BLUE SHIELD

## 2018-08-24 DIAGNOSIS — S82142A Displaced bicondylar fracture of left tibia, initial encounter for closed fracture: Secondary | ICD-10-CM

## 2018-08-24 DIAGNOSIS — Y93K1 Activity, walking an animal: Secondary | ICD-10-CM | POA: Diagnosis not present

## 2018-08-24 DIAGNOSIS — M25462 Effusion, left knee: Secondary | ICD-10-CM | POA: Diagnosis not present

## 2018-08-24 MED ORDER — HYDROCODONE-ACETAMINOPHEN 5-325 MG PO TABS: 1.0000 | ORAL_TABLET | Freq: Four times a day (QID) | ORAL | 0 refills | Status: DC | PRN

## 2018-08-24 MED ORDER — IBUPROFEN 600 MG PO TABS: 600.0000 mg | ORAL_TABLET | Freq: Four times a day (QID) | ORAL | 0 refills | Status: DC | PRN

## 2018-08-24 NOTE — ED Provider Notes (Signed)
HPI  SUBJECTIVE:  Mariah [name] is a 33 y.o. female who presents with left knee pain, swelling.  States that she was walking her dog yesterday, was pulled forward.  States that she heard a pop followed by immediate pain and swelling.  She states that the pain subsided some, but got worse today.  She describes the pain as sharp, intermittent, lasting seconds.  States that her knee is giving way, states that she cannot fully extend or flex her knee.  States that she is able to bear weight difficulty.  No locking, bruising, erythema.  No numbness or tingling distally.  Right ibuprofen 600 mg with improvement in her symptoms.  Symptoms are worse with flexion, extension, weightbearing.  Past medical history negative for diabetes, history of left knee injury.  LMP: Now.  PMD: None.  Past Medical History:  Diagnosis Date  . Allergy   . Anxiety state, unspecified   . Asthma   . Panic disorder without agoraphobia   . Urticaria     Past Surgical History:  Procedure Laterality Date  . TONSILLECTOMY     01/26/07- Dr. Jenne Pane    Family History  Problem Relation Age of Onset  . Hypertension Father   . Depression Mother   . Diabetes Other   . Cancer Other   . Stroke Other     Social History   Tobacco Use  . Smoking status: Never Smoker  . Smokeless tobacco: Never Used  Substance Use Topics  . Alcohol use: Yes    Alcohol/week: 0.0 standard drinks    Comment: very rarely  . Drug use: No     Current Facility-Administered Medications:  .  omalizumab Geoffry Paradise) injection 150 mg, 150 mg, Subcutaneous, Q28 days, Kozlow, Alvira Philips, MD, 150 mg at 03/18/17 1037  Current Outpatient Medications:  .  albuterol (VENTOLIN HFA) 108 (90 Base) MCG/ACT inhaler, Inhale 2 puffs into the lungs every 4 (four) hours as needed for wheezing or shortness of breath., Disp: 1 Inhaler, Rfl: 2 .  drospirenone-ethinyl estradiol (YAZ,GIANVI,LORYNA) 3-0.02 MG tablet, Take 1 tablet by mouth daily., Disp: , Rfl:  .   escitalopram (LEXAPRO) 10 MG tablet, Take 10 mg by mouth daily., Disp: , Rfl:  .  fexofenadine (ALLEGRA) 180 MG tablet, Take 180 mg by mouth daily. Reported on 08/18/2015, Disp: , Rfl:  .  fluticasone (FLONASE) 50 MCG/ACT nasal spray, Place 2 sprays into both nostrils daily. Please dispense generic., Disp: 16 g, Rfl: 5 .  HYDROcodone-acetaminophen (NORCO/VICODIN) 5-325 MG tablet, Take 1-2 tablets by mouth every 6 (six) hours as needed for moderate pain or severe pain., Disp: 12 tablet, Rfl: 0 .  XOLAIR 150 MG injection, INJECT  SUBCUTANEOUSLY EVERY 4 WEEKS, Disp: 2 each, Rfl: 11  Allergies  Allergen Reactions  . Codeine Nausea Only and Other (See Comments)    Passes out  . Prednisone Nausea Only and Other (See Comments)    DIZZINESS.   . Vicodin [Hydrocodone-Acetaminophen] Nausea Only and Other (See Comments)    Passes out     ROS  As noted in HPI.   Physical Exam  BP 124/72 (BP Location: Left Arm)   Pulse 90   Temp 98.2 F (36.8 C) (Oral)   Resp 18   LMP 08/24/2018   SpO2 98%   Constitutional: Well developed, well nourished, no acute distress Eyes:  EOMI, conjunctiva normal bilaterally HENT: Normocephalic, atraumatic,mucus membranes moist Respiratory: Normal inspiratory effort Cardiovascular: Normal rate GI: nondistended skin: No rash, skin intact Musculoskeletal: L Knee  ROM  decreased due to pain, positive effusion.  No erythema, edema, bruising.  Aggravated with full extension and flexion past 90 degrees.  Flexion  intact , positive tenderness entire anterior joint, Patella NT,  Patellar tendon NT, Medial joint NT, Lateral joint NT, Popliteal region NT, Varus LCL stress testing stable, Valgus MCL stress testing stable, McMurray's testing normal, positive mild laxity on anterior drawer test. Distal NVI with intact baseline sensation / motor / pulse distal to knee.   No increased temperature.  Neurologic: Alert & oriented x 3, no focal neuro deficits Psychiatric: Speech  and behavior appropriate   ED Course   Medications - No data to display  Orders Placed This Encounter  Procedures  . DG Knee Complete 4 Views Left    Standing Status:   Standing    Number of Occurrences:   1    Order Specific Question:   Reason for Exam (SYMPTOM  OR DIAGNOSIS REQUIRED)    Answer:   pain swelling , effusion r/o fx  . CT Knee Left Wo Contrast    Standing Status:   Standing    Number of Occurrences:   1    Order Specific Question:   Is patient pregnant?    Answer:   No  . Apply knee sleeve    Standing Status:   Standing    Number of Occurrences:   1    Order Specific Question:   Laterality    Answer:   Left  . Crutches    Standing Status:   Standing    Number of Occurrences:   1    No results found for this or any previous visit (from the past 24 hour(s)). Dg Knee Complete 4 Views Left  Result Date: 08/24/2018 CLINICAL DATA:  Left knee pain after injury 1 day ago EXAM: LEFT KNEE - COMPLETE 4+ VIEW COMPARISON:  None. FINDINGS: Moderate joint effusion with fatty component not excluded. Questionable lucency along the lateral tibial plateau on the frontal view. No dislocation. IMPRESSION: Joint effusion and suspicion of a nondepressed lateral tibial plateau fracture. Consider CT or tunnel view depending on level of clinical suspicion. Electronically Signed   By: Marnee Spring M.D.   On: 08/24/2018 10:59    ED Clinical Impression  Closed fracture of left tibial plateau, initial encounter   ED Assessment/Plan  Suspect ACL tear, however will x-ray knee to rule out any acute fracture.    [place] Narcotic database reviewed for this patient, and feel that the risk/benefit ratio today is favorable for proceeding with a prescription for controlled substance.  No opiate prescriptions in 2 years.  She does have Xanax prescription from a single provider.  Advised her to not take Xanax and Norco.  Reviewed imaging independently.  Questionable lucency along the lateral tibial  plateau on the frontal view.  No dislocation.  Positive joint effusion and suspicion for nondepressed lateral tibial plateau fracture.  See radiology report for full details.  D/w Dale [place], ortho PA on call.  Agrees with radiology's read.  Advised placing in a knee immobilizer where she cannot bend her knee, nonweightbearing, she needs a CT without contrast which I will order but will be followed up on by orthopedics, follow-up with [facility]. [facility] or [facility] this week.  Patient can make that appointment.  Tylenol 1 g 3 or 4 times a day, Norco for severe pain.  Also providing primary care referral list for ongoing care.  Discussed labs, imaging, MDM, treatment plan, and plan  for follow-up with patient. Discussed sn/sx that should prompt return to the ED. patient agrees with plan.   Meds ordered this encounter  Medications  . HYDROcodone-acetaminophen (NORCO/VICODIN) 5-325 MG tablet    Sig: Take 1-2 tablets by mouth every 6 (six) hours as needed for moderate pain or severe pain.    Dispense:  12 tablet    Refill:  0  . DISCONTD: ibuprofen (ADVIL,MOTRIN) 600 MG tablet    Sig: Take 1 tablet (600 mg total) by mouth every 6 (six) hours as needed.    Dispense:  30 tablet    Refill:  0    *This clinic note was created using Scientist, clinical (histocompatibility and immunogenetics). Therefore, there may be occasional mistakes despite careful proofreading.   ?    Domenick Gong, MD 08/24/18 [phone]

## 2018-08-24 NOTE — ED Triage Notes (Signed)
Pt states fell yesterday, c/o lt knee pain

## 2018-08-24 NOTE — Discharge Instructions (Addendum)
1 g of Tylenol 3 or 4 times a day as needed for pain, or you can take the Norco instead of the Tylenol for severe pain.  Do not take the Xanax if you are taking the Norco.  Ice for 20 minutes at a time.  Follow-up with orthopedics in several days.  No weightbearing on this knee.  I have ordered a CT of your knee to further characterize the injury per orthopedics' request.  Go immediately to the ER for pain not controlled with medications, fevers above 100.4, or any other concerns.  Below is a list of primary care practices who are taking new patients for you to follow-up with.  [facility] [address] [place], [place] [address] [phone]  [facility] [address] [place], [place] [address] [phone]  [facility] [phone] [address] [place] [address]  [facility]: [address] [place] [address]  [phone]  [facility]: [address] [place] [address]   [phone]  [facility]: [address] [place] [address]  [phone]  [place] [facility] : [address] [place] [address] [phone]  [facility]: [address] [place] [address] [phone]  [facility]: [address] [place] [address] [phone]  Dr. Oneal Grout [address] [facility] [place] [address]  [phone]  Dr. Jackie Plum, [facility]. [address] [place], [place] [address]  [phone]  Go to www.goodrx.com to look up your medications. This will give you a list of where you can find your prescriptions at the most affordable prices. Or ask the pharmacist what the cash price  is, or if they have any other discount programs available to help make your medication more affordable. This can be less expensive than what you would pay with insurance.

## 2018-08-24 NOTE — ED Notes (Signed)
Appointment made for CT scan at [facility] for 08/26/18 at 8am

## 2018-08-26 ENCOUNTER — Ambulatory Visit (HOSPITAL_COMMUNITY): Payer: BLUE CROSS/BLUE SHIELD

## 2018-08-26 DIAGNOSIS — S82102A Unspecified fracture of upper end of left tibia, initial encounter for closed fracture: Secondary | ICD-10-CM | POA: Diagnosis not present

## 2018-08-26 DIAGNOSIS — S82102D Unspecified fracture of upper end of left tibia, subsequent encounter for closed fracture with routine healing: Secondary | ICD-10-CM | POA: Diagnosis not present

## 2018-08-26 NOTE — ED Notes (Addendum)
Was called by Katrina at #: [phone] ext [phone] for prior authorization for CT knee of patient, tried to obtain prior authorization for AIM and was denied thru patients insurance and needs peer to peer review. Left message for [facility]. CPT 73700 dx. G88.110R, Dr. Chaney Malling ordered.

## 2018-08-28 ENCOUNTER — Ambulatory Visit (HOSPITAL_COMMUNITY): Payer: BLUE CROSS/BLUE SHIELD

## 2018-08-28 ENCOUNTER — Telehealth: Payer: Self-pay | Admitting: Emergency Medicine

## 2018-08-28 NOTE — Telephone Encounter (Signed)
Received multiple calls about pre-authorization for ordered CT scan of knee.  Pre-authorization attempted previously.  Called patient to make aware she will need a new order from the orthopedic surgeon in order to have CT done through insurance.  Patient verbalized understanding.

## 2018-09-01 ENCOUNTER — Ambulatory Visit (HOSPITAL_COMMUNITY): Payer: BLUE CROSS/BLUE SHIELD

## 2018-09-01 ENCOUNTER — Other Ambulatory Visit: Payer: Self-pay | Admitting: Orthopedic Surgery

## 2018-09-01 ENCOUNTER — Other Ambulatory Visit (HOSPITAL_COMMUNITY): Payer: Self-pay | Admitting: Orthopedic Surgery

## 2018-09-01 DIAGNOSIS — S82142A Displaced bicondylar fracture of left tibia, initial encounter for closed fracture: Secondary | ICD-10-CM

## 2018-09-03 ENCOUNTER — Ambulatory Visit (HOSPITAL_COMMUNITY)
Admission: RE | Admit: 2018-09-03 | Discharge: 2018-09-03 | Disposition: A | Discharge status: Home / Self Care | Payer: BLUE CROSS/BLUE SHIELD | Source: Ambulatory Visit | Attending: Orthopedic Surgery | Admitting: Orthopedic Surgery

## 2018-09-03 ENCOUNTER — Other Ambulatory Visit: Payer: Self-pay

## 2018-09-03 DIAGNOSIS — S82122A Displaced fracture of lateral condyle of left tibia, initial encounter for closed fracture: Secondary | ICD-10-CM | POA: Diagnosis not present

## 2018-09-03 DIAGNOSIS — S82142A Displaced bicondylar fracture of left tibia, initial encounter for closed fracture: Secondary | ICD-10-CM | POA: Insufficient documentation

## 2018-11-11 DIAGNOSIS — S82102D Unspecified fracture of upper end of left tibia, subsequent encounter for closed fracture with routine healing: Secondary | ICD-10-CM | POA: Diagnosis not present

## 2018-12-23 DIAGNOSIS — Z6826 Body mass index (BMI) 26.0-26.9, adult: Secondary | ICD-10-CM | POA: Diagnosis not present

## 2018-12-23 DIAGNOSIS — Z01419 Encounter for gynecological examination (general) (routine) without abnormal findings: Secondary | ICD-10-CM | POA: Diagnosis not present

## 2018-12-23 DIAGNOSIS — Z130 Encounter for screening for diseases of the blood and blood-forming organs and certain disorders involving the immune mechanism: Secondary | ICD-10-CM | POA: Diagnosis not present

## 2018-12-23 DIAGNOSIS — Z124 Encounter for screening for malignant neoplasm of cervix: Secondary | ICD-10-CM | POA: Diagnosis not present

## 2018-12-23 DIAGNOSIS — Z113 Encounter for screening for infections with a predominantly sexual mode of transmission: Secondary | ICD-10-CM | POA: Diagnosis not present

## 2018-12-30 ENCOUNTER — Encounter: Payer: Self-pay | Admitting: Family Medicine

## 2018-12-30 ENCOUNTER — Other Ambulatory Visit: Payer: Self-pay

## 2018-12-30 ENCOUNTER — Ambulatory Visit: Payer: BC Managed Care – PPO | Admitting: Family Medicine

## 2018-12-30 VITALS — BP 102/62 | HR 83 | Temp 98.4°F | Ht 61.5 in | Wt 144.1 lb

## 2018-12-30 DIAGNOSIS — Z130 Encounter for screening for diseases of the blood and blood-forming organs and certain disorders involving the immune mechanism: Secondary | ICD-10-CM

## 2018-12-30 DIAGNOSIS — Z13220 Encounter for screening for lipoid disorders: Secondary | ICD-10-CM | POA: Diagnosis not present

## 2018-12-30 DIAGNOSIS — R140 Abdominal distension (gaseous): Secondary | ICD-10-CM | POA: Diagnosis not present

## 2018-12-30 DIAGNOSIS — E663 Overweight: Secondary | ICD-10-CM | POA: Diagnosis not present

## 2018-12-30 DIAGNOSIS — R195 Other fecal abnormalities: Secondary | ICD-10-CM

## 2018-12-30 DIAGNOSIS — Z8379 Family history of other diseases of the digestive system: Secondary | ICD-10-CM

## 2018-12-30 DIAGNOSIS — Z7689 Persons encountering health services in other specified circumstances: Secondary | ICD-10-CM

## 2018-12-30 LAB — COMPREHENSIVE METABOLIC PANEL
ALT: 9 U/L (ref 0–35)
AST: 12 U/L (ref 0–37)
Albumin: 4 g/dL (ref 3.5–5.2)
Alkaline Phosphatase: 65 U/L (ref 39–117)
BUN: 13 mg/dL (ref 6–23)
CO2: 25 mEq/L (ref 19–32)
Calcium: 8.8 mg/dL (ref 8.4–10.5)
Chloride: 102 mEq/L (ref 96–112)
Creatinine, Ser: 0.8 mg/dL (ref 0.40–1.20)
GFR: 83.12 mL/min (ref >60.00)
Glucose, Bld: 84 mg/dL (ref 70–99)
Potassium: 4.4 mEq/L (ref 3.5–5.1)
Sodium: 137 mEq/L (ref 135–145)
Total Bilirubin: 0.5 mg/dL (ref 0.2–1.2)
Total Protein: 6.5 g/dL (ref 6.0–8.3)

## 2018-12-30 LAB — CBC WITH DIFFERENTIAL/PLATELET
Basophils Absolute: 0.1 10*3/uL (ref 0.0–0.1)
Basophils Relative: 0.7 % (ref 0.0–3.0)
Eosinophils Absolute: 0.5 10*3/uL (ref 0.0–0.7)
Eosinophils Relative: 5.7 % — ABNORMAL HIGH (ref 0.0–5.0)
HCT: 39.7 % (ref 36.0–46.0)
Hemoglobin: 13.1 g/dL (ref 12.0–15.0)
Lymphocytes Relative: 19.8 % (ref 12.0–46.0)
Lymphs Abs: 1.7 10*3/uL (ref 0.7–4.0)
MCHC: 32.9 g/dL (ref 30.0–36.0)
MCV: 81.7 fl (ref 78.0–100.0)
Monocytes Absolute: 0.5 10*3/uL (ref 0.1–1.0)
Monocytes Relative: 6 % (ref 3.0–12.0)
Neutro Abs: 6 10*3/uL (ref 1.4–7.7)
Neutrophils Relative %: 67.8 % (ref 43.0–77.0)
Platelets: 325 10*3/uL (ref 150.0–400.0)
RBC: 4.86 Mil/uL (ref 3.87–5.11)
RDW: 13.8 % (ref 11.5–15.5)
WBC: 8.8 10*3/uL (ref 4.0–10.5)

## 2018-12-30 LAB — LIPID PANEL
Cholesterol: 180 mg/dL (ref 0–200)
HDL: 72.2 mg/dL (ref >39.00)
LDL Cholesterol: 80 mg/dL (ref 0–99)
NonHDL: 107.36
Total CHOL/HDL Ratio: 2
Triglycerides: 135 mg/dL (ref 0.0–149.0)
VLDL: 27 mg/dL (ref 0.0–40.0)

## 2018-12-30 LAB — TSH: TSH: 2.7 u[IU]/mL (ref 0.35–4.50)

## 2018-12-30 NOTE — Patient Instructions (Signed)
Good to see you today  I have put in a referral to gastroenterology and you should get a call about an appointment  Try to walk daily and continue to do stretching. Look at [employer] (yoga by Vincente Liberty).

## 2018-12-30 NOTE — Progress Notes (Signed)
Subjective:    Patient ID: Mariah [name], female    DOB: 1986-01-05, 33 y.o.   MRN: [id number]  HPI This is a 33 yo female who presents today to establish care. 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 She enjoys outdoor activities.   Last CPE- sees gyn regularly, last visit 12/22/2018 Pap- 12/22/2018 Tdap- 09/05/2015 Flu- annual Eye- annual Dental- regular Exercise- somewhat limited by exercise induced asthma  Passing out- never diagnosed with hypoglycemia, but seems to be sensitive to sugar. Last episode of syncope 3/20- after fracturing tibia. Also brought on by taking pain medication. She is careful about what she eats. Can feel episode coming on and can stop it by eating peanut butter  Abdominal bloating and gas- has problems with raw, cookedvegetables, fruits, garlic. Always has soft BM, daily. Bowel movements always urgent. Takes occasional immodium. Mucus in BM, no blood. Sees undigested food. Occasional cramping, no regular pain. Dad with Crohn's.   ROS- denies visual changes, headaches, SOB, chest pain, cough, muscle/joint pains.   Past Medical History:  Diagnosis Date  . Allergy   . Anxiety state, unspecified   . Asthma   . Panic disorder without agoraphobia   . Urticaria    Past Surgical History:  Procedure Laterality Date  . TONSILLECTOMY     01/26/07- Dr. Jenne Pane   Family History  Problem Relation Age of Onset  . Hypertension Father   . Drug abuse Father   . Arthritis Father   . Depression Mother   . Diabetes Other   . Cancer Other   . Stroke Other    Social History   Tobacco Use  . Smoking status: Never Smoker  . Smokeless tobacco: Never Used  Substance Use Topics  . Alcohol use: Yes    Alcohol/week: 0.0 standard drinks    Comment: very rarely  . Drug use: No      Review of Systems Per HPI    Objective:   Physical Exam Physical Exam  Vitals reviewed. Constitutional: Oriented to person, place, and  time. Appears well-developed and well-nourished.  HENT:  Head: Normocephalic and atraumatic.  Eyes: Conjunctivae are normal.  Neck: Normal range of motion. Neck supple.  Cardiovascular: Normal rate.   Pulmonary/Chest: Effort normal.  Musculoskeletal: Normal range of motion.  Neurological: Alert and oriented to person, place, and time.  Psychiatric: Normal mood and affect. Behavior is normal. Judgment and thought content normal.   BP 102/62 (BP Location: Right Arm, Patient Position: Sitting, Cuff Size: Normal)   Pulse 83   Temp 98.4 F (36.9 C) (Oral)   Ht 5' 1.5" (1.562 m)   Wt 144 lb 1.9 oz (65.4 kg)   LMP 12/24/2018 (Exact Date)   SpO2 98%   BMI 26.79 kg/m       Assessment & Plan:  1. Encounter to establish care - Discussed and encouraged healthy lifestyle choices- adequate sleep, regular exercise, stress management and healthy food choices.   2. Abdominal bloating - Comprehensive metabolic panel - CBC with Differential - Ambulatory referral to Gastroenterology  3. Screening for lipid disorders - Lipid Panel  4. Loose stools - Comprehensive metabolic panel - Ambulatory referral to Gastroenterology  5. Mucus in stool - Comprehensive metabolic panel - Ambulatory referral to Gastroenterology  6. Screening for deficiency anemia - CBC with Differential  7. Overweight (BMI 25.0-29.9) - Comprehensive metabolic panel - CBC with Differential - Lipid Panel - TSH  8. Family history of Crohn's disease -  Ambulatory referral to Gastroenterology   Olean Ree, FNP-BC  [facility], [facility]  12/30/2018 9:18 AM

## 2019-01-01 ENCOUNTER — Encounter: Payer: Self-pay | Admitting: Obstetrics and Gynecology

## 2019-01-01 LAB — HM PAP SMEAR: HM Pap smear: NEGATIVE

## 2019-01-14 IMAGING — MR MR HEAD WO/W CM
13 of 14 series · 34 of 48 positions shown · IV contrast (multihance)
Comparison: None.

CLINICAL DATA: Vision changes, optic disc edema noted on
examination. Assess for etiology.

EXAM:
MRI HEAD WITHOUT AND WITH CONTRAST
TECHNIQUE: Multiplanar, multiecho pulse sequences of the brain and surrounding
structures were obtained without and with intravenous contrast.
CONTRAST:  10mL MULTIHANCE GADOBENATE DIMEGLUMINE 529 MG/ML IV SOLN

[Series 2: T1 · sagittal · 5.0mm · 0.47mm/px · 1 of 21 slices shown]
[im 1/21]
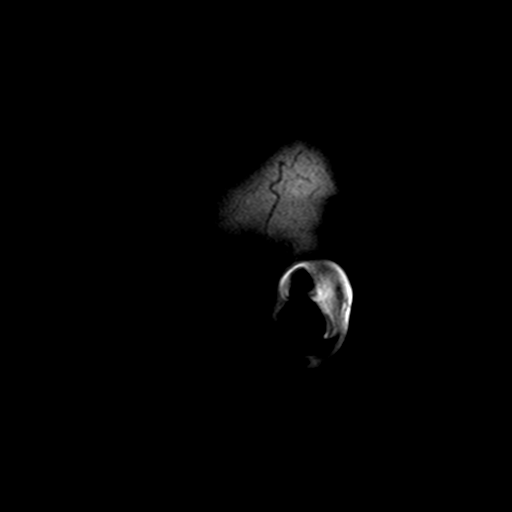

[Series 4: DWI · axial · 3.0mm · 0.94mm/px · z∈[-50,+96]mm · 2 of 50 slices shown (1 of 4)]
[im 1/50]
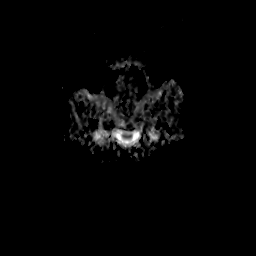
[im 50/50]
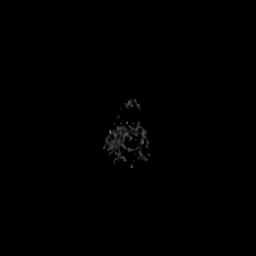

[Series 6: DWI · coronal · 5.0mm · 1.80mm/px · 3 of 39 slices shown (2 of 4)]
[im 1/39]
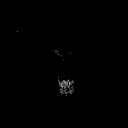
[im 20/39]
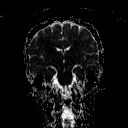
[im 39/39]
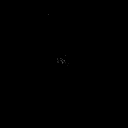

[Series 7: DWI · axial · 3.0mm · 0.94mm/px · z∈[-50,+96]mm · 3 of 50 slices shown (3 of 4)]
[im 1/50]
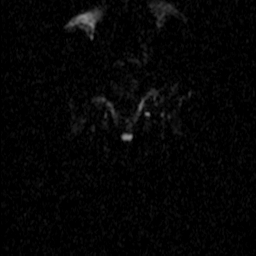
[im 25/50]
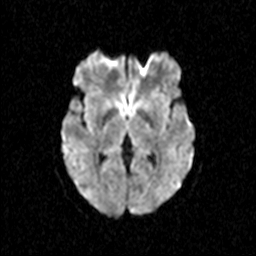
[im 50/50]
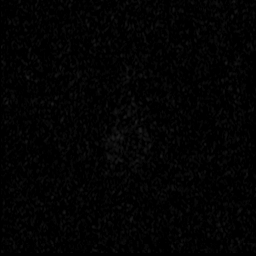

[Series 8: DWI · coronal · 5.0mm · 1.80mm/px · 3 of 38 slices shown (4 of 4)]
[im 1/38]
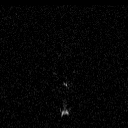
[im 19/38]
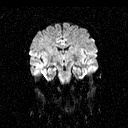
[im 38/38]
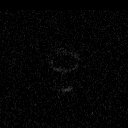

[Series 9: T2 · axial · 5.0mm · 0.45mm/px · z∈[-53,+100]mm · 2 of 23 slices shown (1 of 2)]
[im 1/23]
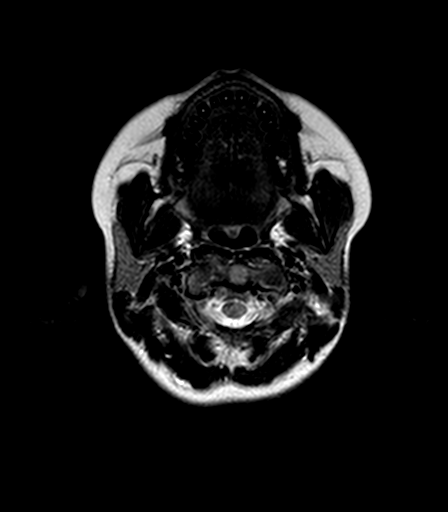
[im 23/23]
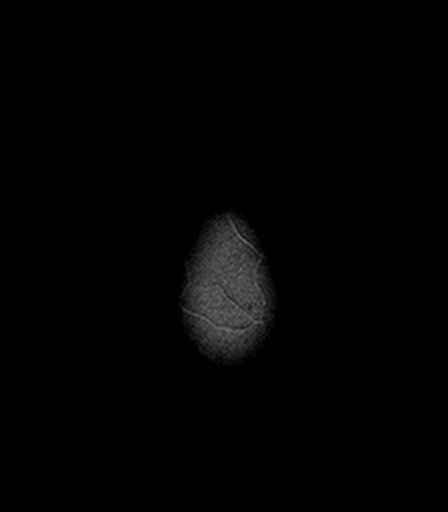

[Series 11: T2 · axial · 5.0mm · 0.45mm/px · z∈[-53,+100]mm · 2 of 23 slices shown (2 of 2)]
[im 1/23]
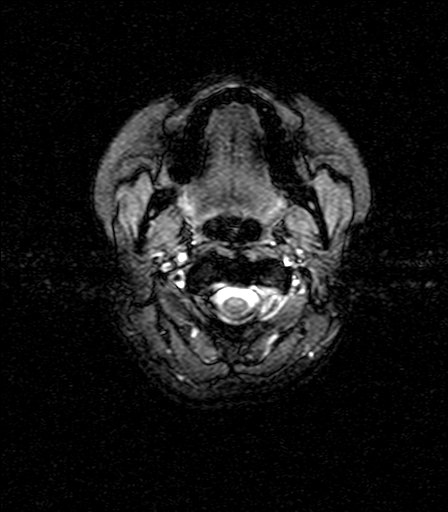
[im 23/23]
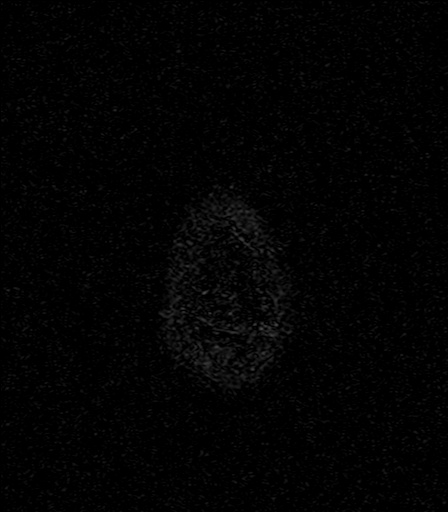

[Series 12: FLAIR · axial · 5.0mm · 0.90mm/px · z∈[-42,+100]mm · 2 of 23 slices shown (1 of 2)]
[im 1/23]
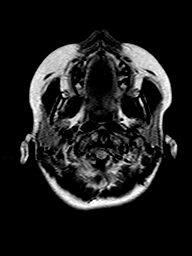
[im 23/23]
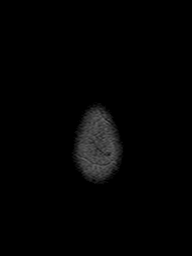

[Series 14: FLAIR · sagittal · 5.0mm · 0.90mm/px · 2 of 23 slices shown (2 of 2)]
[im 1/23]
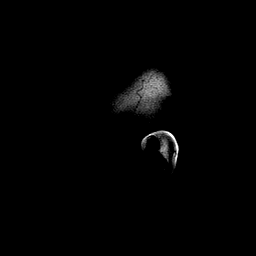
[im 23/23]
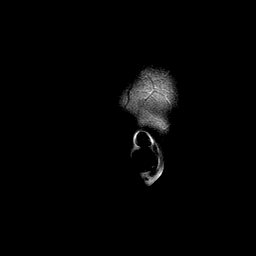

[Series 15: T2 post-contrast · coronal · 5.0mm · 0.45mm/px · 2 of 27 slices shown]
[im 1/27]
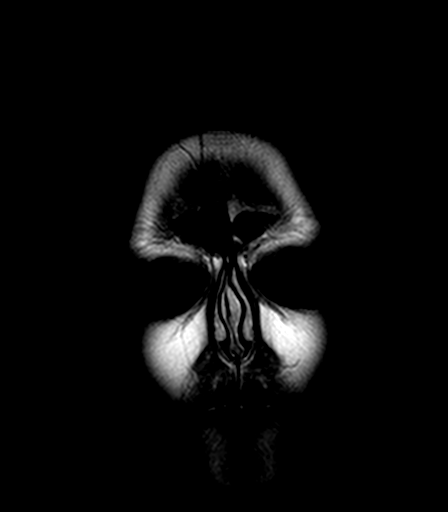
[im 27/27]
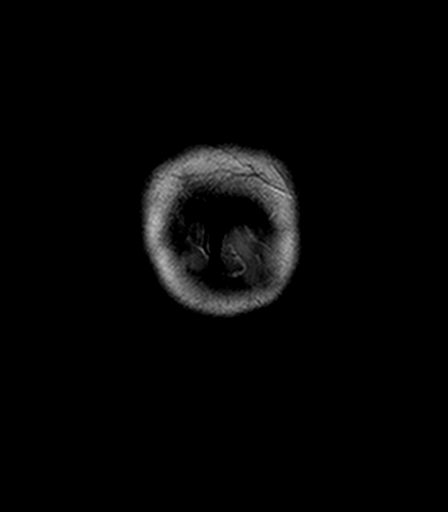

[Series 16: T1 post-contrast · axial · 1.0mm · 0.45mm/px · z∈[-55,+103]mm · 8 of 160 slices shown (1 of 3)]
[im 1/160]
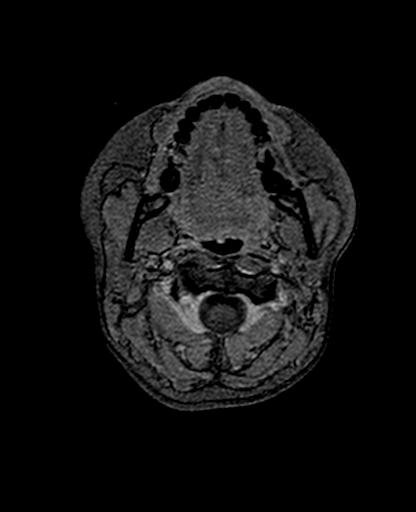
[im 32/160]
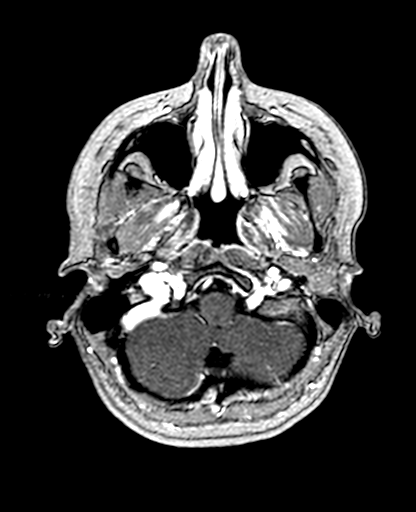
[im 48/160]
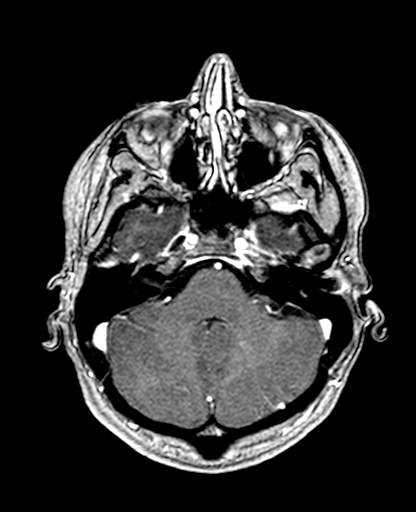
[im 64/160]
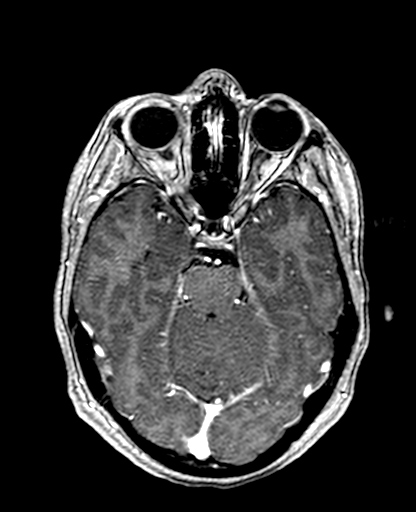
[im 96/160]
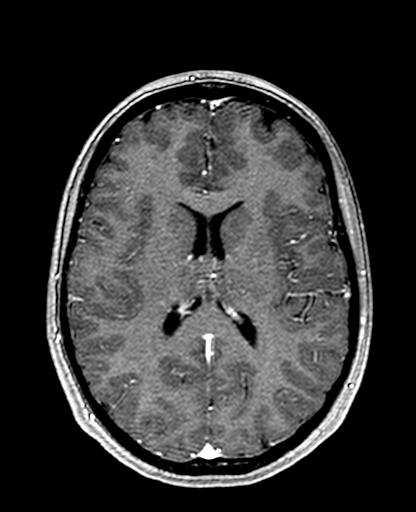
[im 112/160]
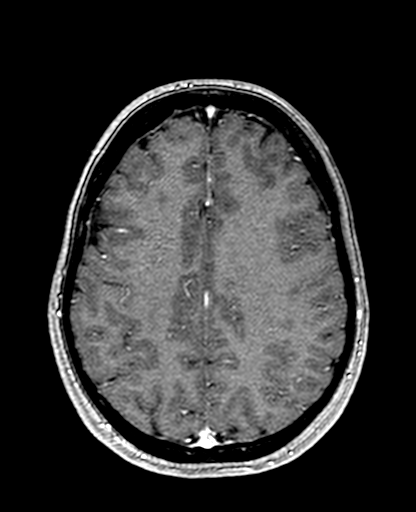
[im 128/160]
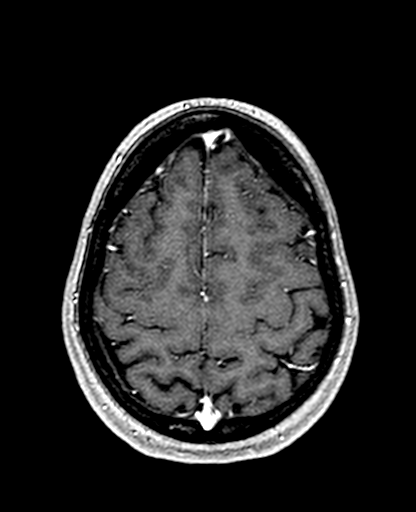
[im 160/160]
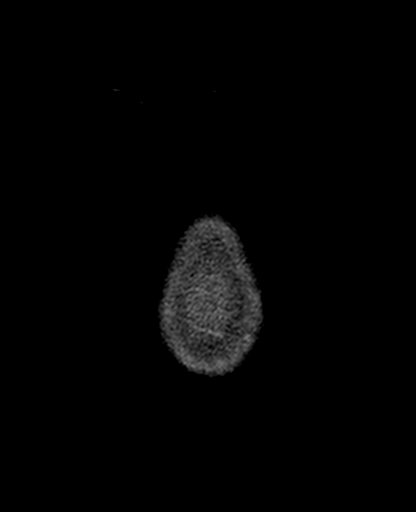

[Series 17: T1 post-contrast · coronal · 5.0mm · 0.45mm/px · 2 of 27 slices shown (2 of 3)]
[im 1/27]
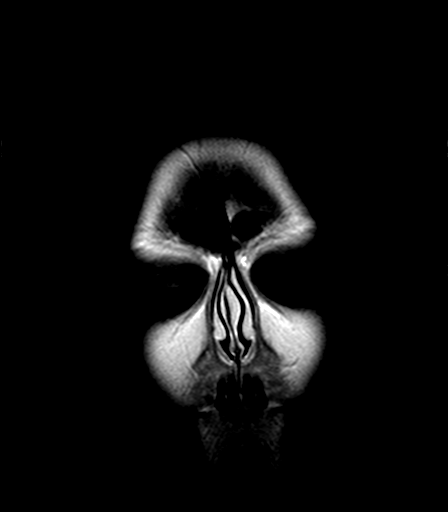
[im 27/27]
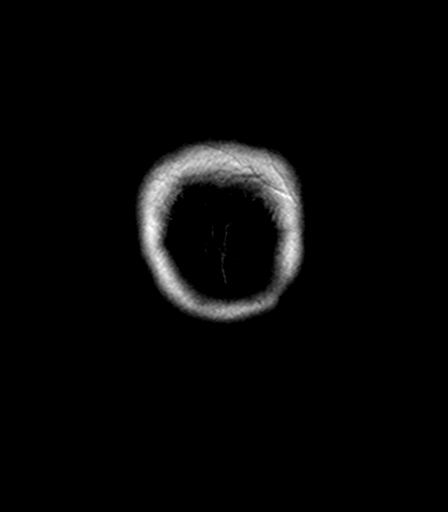

[Series 18: T1 post-contrast · sagittal · 5.0mm · 0.45mm/px · 2 of 25 slices shown (3 of 3)]
[im 1/25]
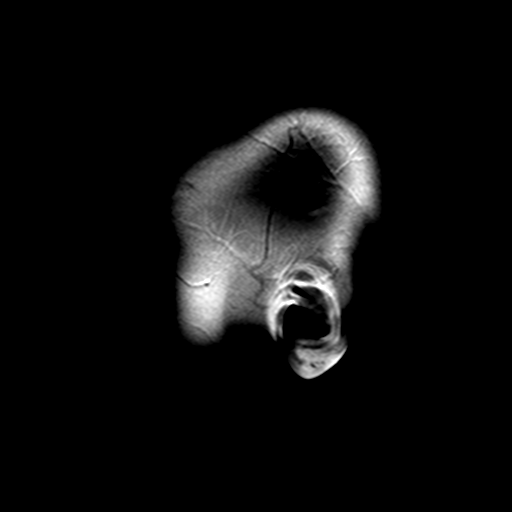
[im 25/25]
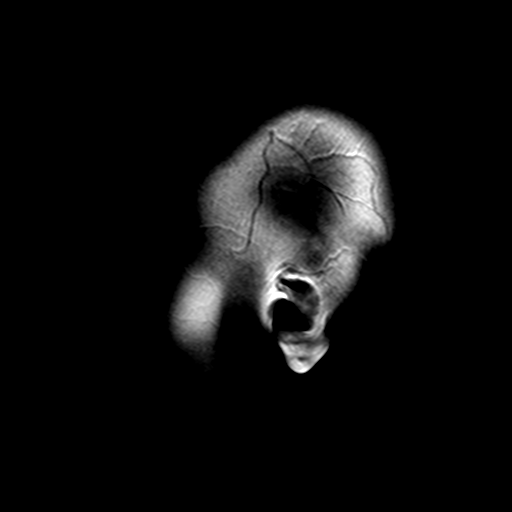

[34 of 48 positions shown; findings below may reference images not displayed]

FINDINGS: INTRACRANIAL CONTENTS: No reduced diffusion to suggest acute
ischemia or hyperacute demyelination. No susceptibility artifact to
suggest hemorrhage. The ventricles and sulci are normal for
patient's age. No suspicious parenchymal signal, masses, mass
effect. No abnormal intraparenchymal or extra-axial enhancement. No
abnormal extra-axial fluid collections. No extra-axial masses.

VASCULAR: Normal major intracranial vascular flow voids present at
skull base.

SKULL AND UPPER CERVICAL SPINE: No abnormal sellar expansion. No
suspicious calvarial bone marrow signal. Craniocervical junction
maintained.

SINUSES/ORBITS: The mastoid air-cells and included paranasal sinuses
are well-aerated.The included ocular globes and orbital contents are
non-suspicious.

OTHER: None.
IMPRESSION: Normal MRI of the brain with and without contrast.

## 2019-01-26 ENCOUNTER — Ambulatory Visit: Payer: BC Managed Care – PPO | Admitting: Gastroenterology

## 2019-03-02 ENCOUNTER — Other Ambulatory Visit: Payer: Self-pay | Admitting: *Deleted

## 2019-03-02 DIAGNOSIS — Z20822 Contact with and (suspected) exposure to covid-19: Secondary | ICD-10-CM

## 2019-03-03 LAB — NOVEL CORONAVIRUS, NAA: SARS-CoV-2, NAA: NOT DETECTED

## 2019-03-15 ENCOUNTER — Encounter: Payer: Self-pay | Admitting: Family Medicine

## 2019-05-26 ENCOUNTER — Encounter: Payer: Self-pay | Admitting: Gastroenterology

## 2019-06-10 ENCOUNTER — Ambulatory Visit: Payer: Self-pay | Admitting: Allergy & Immunology

## 2019-07-01 ENCOUNTER — Encounter: Payer: Self-pay | Admitting: Allergy & Immunology

## 2019-07-01 ENCOUNTER — Other Ambulatory Visit: Payer: Self-pay

## 2019-07-01 ENCOUNTER — Ambulatory Visit (INDEPENDENT_AMBULATORY_CARE_PROVIDER_SITE_OTHER): Payer: 59 | Admitting: Allergy & Immunology

## 2019-07-01 VITALS — BP 120/76 | HR 98 | Temp 97.8°F | Resp 16 | Ht 63.0 in | Wt 151.6 lb

## 2019-07-01 DIAGNOSIS — N898 Other specified noninflammatory disorders of vagina: Secondary | ICD-10-CM | POA: Insufficient documentation

## 2019-07-01 DIAGNOSIS — J4520 Mild intermittent asthma, uncomplicated: Secondary | ICD-10-CM | POA: Diagnosis not present

## 2019-07-01 DIAGNOSIS — J3089 Other allergic rhinitis: Secondary | ICD-10-CM

## 2019-07-01 MED ORDER — FLUTICASONE PROPIONATE 50 MCG/ACT NA SUSP: 2.0000 | Freq: Every day | NASAL | 5 refills | Status: DC

## 2019-07-01 MED ORDER — ALBUTEROL SULFATE HFA 108 (90 BASE) MCG/ACT IN AERS: 2.0000 | INHALATION_SPRAY | RESPIRATORY_TRACT | 2 refills | Status: DC | PRN

## 2019-07-01 NOTE — Patient Instructions (Addendum)
1. Mild intermittent asthma, uncomplicated - Lung testing deferred today. - We will refill your albuterol inhaler. - It seems that this is doing a good job controlling your symptoms. - If you are using your albuterol more often, let us know and we can discuss starting a controller medication.  2. Allergic rhinitis - Continue with Allegra one tablet once daily as needed. - Continue with Flonase one spray per nostril daily as needed.   3. Return in about 1 year (around 06/30/2020). This can be an in-person, a virtual Webex or a telephone follow up visit.   Please inform us of any Emergency Department visits, hospitalizations, or changes in symptoms. Call us before going to the ED for breathing or allergy symptoms since we might be able to fit you in for a sick visit. Feel free to contact us anytime with any questions, problems, or concerns.  It was a pleasure to meet you today!  Websites that have reliable patient information: 1. American Academy of Asthma, Allergy, and Immunology: www.aaaai.org 2. Food Allergy Research and Education (FARE): foodallergy.org 3. Mothers of Asthmatics: http://www.asthmacommunitynetwork.org 4. American College of Allergy, Asthma, and Immunology: www.acaai.org  "Like" [place] on Facebook and Instagram for our latest updates!        Make sure you are registered to vote! If you have moved or changed any of your contact information, you will need to get this updated before voting!  In some cases, you MAY be able to register to vote online: [email or web address]

## 2019-07-01 NOTE — Progress Notes (Signed)
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 Mild intermittent asthma, uncomplicated - Lung testing deferred today. - We will refill your albuterol inhaler. - It seems that this is doing a good job controlling your symptoms. - If you are using your albuterol more often, let us know and we can discuss starting a controller medication.  2. Allergic rhinitis - Continue with Allegra one tablet once daily as needed. - Continue with Flonase one spray per nostril daily as needed.   3. Return in about 1 year (around 06/30/2020). This can be an in-person, a virtual Webex or a telephone follow up visit.  Subjective:   Mariah [name] is a 34 y.o. female presenting today for follow up of  Chief Complaint  Patient presents with  . Urticaria  . Allergic Rhinitis   . asking for an albuterol inhaler    Mariah [name] has a history of the following: Patient Active Problem List   Diagnosis Date Noted  . Pruritus of vagina 07/01/2019  . URI (upper respiratory infection) 01/28/2017  . Missed period 01/28/2017  . Encounter to establish care 05/04/2015  . Motion sickness 05/04/2015  . Chronic urticaria 02/25/2015  . Mild intermittent asthma 02/25/2015  . Rash and nonspecific skin eruption 09/21/2012  . Left knee pain 08/02/2011  . Contraception management 05/13/2011  . PAP SMEAR, ABNORMAL 04/27/2009  . SYNCOPE 02/08/2009  . ALLERGIC RHINITIS 12/21/2008  . Asthma, chronic 08/18/2007  . Generalized anxiety disorder 03/11/2007  . PANIC ATTACK 03/11/2007    History obtained from: chart review and patient.  Mariah [name] is a 34 y.o. female presenting for a follow up visit.  She was last seen by Dr. Lucie Leather in May 2019.  At that time, she was doing well off of Xolair, which she was receiving due to  a history of chronic idiopathic urticaria.  Evidently, at the time, her husband was having an affair and she was in the middle of getting a divorce.  Regardless, she was not having any episodes of urticaria off of the Xolair, so she never restarted it.  Since last visit, she has done well.  She is here for medication refills.  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  She previously worked at [employer].  She has been a Associate Professor for approximately 11 years.  Prior to that, she worked as a Fish farm manager.  Asthma/Respiratory Symptom History: She has albuterol on hand to use as neeed. As a child, she had EIB. It never really resolved. Exercise has been particularly bad. She uses it prophylactically before she exercises. She also has to use with with periods of tightness. She uses it for emergencies only as needed. Mariah [name]'s asthma has been well controlled. She has not required rescue medication, experienced nocturnal awakenings due to lower respiratory symptoms, nor have activities of daily living been limited. She has required no Emergency Department or Urgent Care visits for her asthma. She has required zero courses of systemic steroids for asthma exacerbations since the last visit. ACT score today is 25, indicating excellent asthma symptom control.   Allergic Rhinitis Symptom History: She is on the fluticasone and Allegra as needed. The fluticasone is cheaper than OTC. She denies any sinus infections, but typically gets them around once per year. Now she  has done well. She was on allergy shots when she younger. She thinks that she was on shots for 2-3 years or so. She actually started the Xolair after stopping the allergy shots.   Urticaria Symptom History: She did have idioapthic urticaria for years and was on Xolair. Her cat died and she went off of Xolair to see if she did fine and she did.   Otherwise, there have been no changes to her past medical history, surgical  history, family history, or social history. She is a big Medical illustrator. She is technically a [employer], but she asked the [employer] to go into [place].     Review of Systems  Constitutional: Negative.  Negative for chills, fever, malaise/fatigue and weight loss.  HENT: Negative.  Negative for congestion, ear discharge, ear pain, sinus pain and sore throat.   Eyes: Negative for pain, discharge and redness.  Respiratory: Negative for cough, sputum production, shortness of breath and wheezing.   Cardiovascular: Negative.  Negative for chest pain and palpitations.  Gastrointestinal: Negative for abdominal pain, constipation, diarrhea, heartburn, nausea and vomiting.  Skin: Negative.  Negative for itching and rash.  Neurological: Negative for dizziness and headaches.  Endo/Heme/Allergies: Negative for environmental allergies. Does not bruise/bleed easily.       Objective:   Blood pressure 120/76, pulse 98, temperature 97.8 F (36.6 C), temperature source Temporal, resp. rate 16, height 5\' 3"  (1.6 m), weight 151 lb 9.6 oz (68.8 kg), SpO2 97 %. Body mass index is 26.85 kg/m.   Physical Exam:  Physical Exam  Constitutional: She appears well-developed.  Pleasant talkative female.  Cooperative with the exam.  HENT:  Head: Normocephalic and atraumatic.  Right Ear: Tympanic membrane, external ear and ear canal normal.  Left Ear: Tympanic membrane, external ear and ear canal normal.  Nose: Mucosal edema and rhinorrhea present. No nasal deformity or septal deviation. No epistaxis. Right sinus exhibits no maxillary sinus tenderness and no frontal sinus tenderness. Left sinus exhibits no maxillary sinus tenderness and no frontal sinus tenderness.  Mouth/Throat: Uvula is midline and oropharynx is clear and moist. Mucous membranes are not pale and not dry.  Eyes: Pupils are equal, round, and reactive to light. Conjunctivae and EOM are normal. Right eye exhibits no chemosis and no  discharge. Left eye exhibits no chemosis and no discharge. Right conjunctiva is not injected. Left conjunctiva is not injected.  Cardiovascular: Normal rate, regular rhythm and normal heart sounds.  Respiratory: Effort normal and breath sounds normal. No accessory muscle usage. No tachypnea. No respiratory distress. She has no wheezes. She has no rhonchi. She has no rales. She exhibits no tenderness.  Moving air well in all lung fields.  No increased work of breathing.  Lymphadenopathy:    She has no cervical adenopathy.  Neurological: She is alert.  Skin: No abrasion, no petechiae and no rash noted. Rash is not papular, not vesicular and not urticarial. No erythema. No pallor.  No eczematous or urticarial lesions noted.  Psychiatric: She has a normal mood and affect.     Diagnostic studies: none (patient declined spiro)      Salvatore Marvel, MD  [facility]

## 2019-07-15 ENCOUNTER — Ambulatory Visit (INDEPENDENT_AMBULATORY_CARE_PROVIDER_SITE_OTHER): Payer: 59 | Admitting: Gastroenterology

## 2019-07-15 ENCOUNTER — Encounter: Payer: Self-pay | Admitting: Gastroenterology

## 2019-07-15 VITALS — BP 100/64 | HR 80 | Temp 97.6°F | Ht 63.0 in | Wt 150.5 lb

## 2019-07-15 DIAGNOSIS — Z8379 Family history of other diseases of the digestive system: Secondary | ICD-10-CM | POA: Diagnosis not present

## 2019-07-15 DIAGNOSIS — R152 Fecal urgency: Secondary | ICD-10-CM | POA: Diagnosis not present

## 2019-07-15 DIAGNOSIS — R194 Change in bowel habit: Secondary | ICD-10-CM | POA: Diagnosis not present

## 2019-07-15 MED ORDER — SUTAB 1479-225-188 MG PO TABS: 1.0000 | ORAL_TABLET | Freq: Once | ORAL | 0 refills | Status: AC

## 2019-07-15 NOTE — Progress Notes (Signed)
HPI :  34 year old female with a history of chronic urticaria, asthma, anxiety, referred here by Olean Ree, FNP for bowel changes and family history of IBD.  She states she has had bowel changes that have bothered her for the past 1 to 2 years or so.  She has a lot of urgency with her stools, in the form is often loose to soft.  She has episodic urgency which is unpredictable but bothers her frequently.  She has at least 3 bowel movements a day.  She sees a lot of mucus in her stools but denies any blood.  She needs to use Imodium at work to help her get through the day and avoid using the bathroom.  She states her father had similar symptoms which led to a work-up and he was ultimately diagnosed with Crohn's disease about a year ago, at age 56s.  He is currently on Humira and doing better.  She states her brother also has bowel symptoms but refuses to seek medical attention.  She denies any routine NSAID use.  She does have occasional abdominal cramping in her abdomen, sounds like she was given hyoscyamine in the past and it helped her.  She also has significant bloating and distention in her abdomen associated with these pains at times.  She uses simethicone which helps.  She denies any other known family history of IBD or celiac disease.  She has never had a prior colonoscopy.  She does not smoke tobacco.  She has had a history of chronic urticaria for several years.  Given this issue and her asthma she was placed on Xolair and was on this for a few years which she states worked quite well and resolved the urticaria.  She has since stopped the Xolair and has not had recurrence.  She has mild exercise-induced asthma controlled with albuterol as needed.  She denies any known food allergies.  She is quite concerned about her bowel changes in relation to her family history of Crohn's disease and wants to make sure she does not have inflammatory bowel disease.   Past Medical History:  Diagnosis  Date  . Allergy   . Anxiety state, unspecified   . Asthma   . Panic disorder without agoraphobia   . Urticaria      Past Surgical History:  Procedure Laterality Date  . TONSILLECTOMY     01/26/07- Dr. Jenne Pane  . WISDOM TOOTH EXTRACTION     Family History  Problem Relation Age of Onset  . Hypertension Father   . Drug abuse Father   . Arthritis Father   . Crohn's disease Father   . Depression Mother   . Diabetes Other   . Cancer Other   . Stroke Other   . Congestive Heart Failure Paternal Grandmother    Social History   Tobacco Use  . Smoking status: Never Smoker  . Smokeless tobacco: Never Used  Substance Use Topics  . Alcohol use: Yes    Alcohol/week: 0.0 standard drinks    Comment: very rarely  . Drug use: No   Current Outpatient Medications  Medication Sig Dispense Refill  . albuterol (VENTOLIN HFA) 108 (90 Base) MCG/ACT inhaler Inhale 2 puffs into the lungs every 4 (four) hours as needed for wheezing or shortness of breath. 18 g 2  . ALPRAZolam (XANAX) 0.5 MG tablet Take 0.5 mg by mouth at bedtime.    . drospirenone-ethinyl estradiol (YAZ,GIANVI,LORYNA) 3-0.02 MG tablet Take 1 tablet by mouth daily.    Marland Kitchen  escitalopram (LEXAPRO) 10 MG tablet Take 10 mg by mouth daily.    . fexofenadine (ALLEGRA) 180 MG tablet Take 180 mg by mouth daily. Reported on 08/18/2015    . fluticasone (FLONASE) 50 MCG/ACT nasal spray Place 2 sprays into both nostrils daily. Please dispense generic. 16 g 5   No current facility-administered medications for this visit.   Allergies  Allergen Reactions  . Codeine Nausea Only and Other (See Comments)    Passes out  . Prednisone Nausea Only and Other (See Comments)    DIZZINESS.   . Vicodin [Hydrocodone-Acetaminophen] Nausea Only and Other (See Comments)    Passes out     Review of Systems: All systems reviewed and negative except where noted in HPI.   Lab Results  Component Value Date   WBC 8.8 12/30/2018   HGB 13.1 12/30/2018   HCT  39.7 12/30/2018   MCV 81.7 12/30/2018   PLT 325.0 12/30/2018    Lab Results  Component Value Date   CREATININE 0.80 12/30/2018   BUN 13 12/30/2018   NA 137 12/30/2018   K 4.4 12/30/2018   CL 102 12/30/2018   CO2 25 12/30/2018    Lab Results  Component Value Date   ALT 9 12/30/2018   AST 12 12/30/2018   ALKPHOS 65 12/30/2018   BILITOT 0.5 12/30/2018     Physical Exam: BP 100/64   Pulse 80   Temp 97.6 F (36.4 C)   Ht 5\' 3"  (1.6 m)   Wt 150 lb 8 oz (68.3 kg)   BMI 26.66 kg/m  Constitutional: Pleasant,well-developed, female in no acute distress. HEENT: Normocephalic and atraumatic. Conjunctivae are normal. No scleral icterus. Neck supple.  Cardiovascular: Normal rate, regular rhythm.  Pulmonary/chest: Effort normal and breath sounds normal. No wheezing, rales or rhonchi. Abdominal: Soft, nondistended, nontender. There are no masses palpable.  Extremities: no edema Lymphadenopathy: No cervical adenopathy noted. Neurological: Alert and oriented to person place and time. Skin: Skin is warm and dry. No rashes noted. Psychiatric: Normal mood and affect. Behavior is normal.   ASSESSMENT AND PLAN: 34 year old female here for new patient assessment of the following:  Altered bowel habits / fecal urgency / family history of Crohn's disease - history as above, few years worth of severe urgency of her stools which are often loose and associated with mucus, and can have abdominal discomfort.  Labs are reassuring, but she does have a family history of Crohn's disease in her father as outlined.  She is quite concerned about this possibility of IBD and wants to ensure she does not have this.  I discussed options with her, IBD is on the differential however so is irritable bowel syndrome, microscopic colitis, celiac disease, etc.  I discussed work-up with her to include labs and stool studies initially versus colonoscopy or other imaging.  Following discussion of options, colonoscopy is  the most definitive way to exclude IBD and that is what she wanted to proceed with.  I discussed risk and benefits of colonoscopy and she agreed to proceed.  Further recommendations pending the results.  If colonoscopy is negative, I would recommend serologic testing for celiac disease and will have discussion of treatment options with her.  I spent 45 minutes of time, including in depth chart review, independent review of results as outlined above, communicating results with the patient directly, face-to-face time with the patient, coordinating care, ordering studies and medications as appropriate, and documenting this encounter.   35, MD [facility]  CC:  Elby Beck, FNP

## 2019-07-15 NOTE — Patient Instructions (Addendum)
If you are age 33 or older, your body mass index should be between 23-30. Your Body mass index is 26.66 kg/m. If this is out of the aforementioned range listed, please consider follow up with your Primary Care Provider.  If you are age 16 or younger, your body mass index should be between 19-25. Your Body mass index is 26.66 kg/m. If this is out of the aformentioned range listed, please consider follow up with your Primary Care Provider.   You have been scheduled for a colonoscopy. Please follow written instructions given to you at your visit today.  Please pick up your prep supplies at the pharmacy within the next 1-3 days. If you use inhalers (even only as needed), please bring them with you on the day of your procedure. Your physician has requested that you go to www.startemmi.com and enter the access code given to you at your visit today. This web site gives a general overview about your procedure. However, you should still follow specific instructions given to you by our office regarding your preparation for the procedure.  We have sent Sutabs to your pharmacy using a universal code that should make it less expensive.  If it is not covered by your insurance well, please let us know and we can send Suprep as an alternative.   Thank you for entrusting me with your care and for choosing [facility], Dr. Ileene Patrick

## 2019-07-16 ENCOUNTER — Telehealth: Payer: Self-pay | Admitting: Gastroenterology

## 2019-07-16 NOTE — Telephone Encounter (Signed)
Pt said she found a coupon and going to get the "sutabs"

## 2019-07-16 NOTE — Telephone Encounter (Signed)
Pt is scheduled for a colonoscopy and informed that prep tablets are not covered.  She requested either Suprep or GoLytely.

## 2019-07-20 ENCOUNTER — Encounter: Payer: Self-pay | Admitting: Gastroenterology

## 2019-07-20 ENCOUNTER — Ambulatory Visit (INDEPENDENT_AMBULATORY_CARE_PROVIDER_SITE_OTHER): Payer: 59

## 2019-07-20 ENCOUNTER — Other Ambulatory Visit: Payer: Self-pay | Admitting: Gastroenterology

## 2019-07-20 DIAGNOSIS — Z1159 Encounter for screening for other viral diseases: Secondary | ICD-10-CM

## 2019-07-21 LAB — SARS CORONAVIRUS 2 (TAT 6-24 HRS): SARS Coronavirus 2: NEGATIVE

## 2019-07-22 ENCOUNTER — Encounter: Payer: Self-pay | Admitting: Gastroenterology

## 2019-07-22 ENCOUNTER — Other Ambulatory Visit: Payer: Self-pay

## 2019-07-22 ENCOUNTER — Ambulatory Visit (AMBULATORY_SURGERY_CENTER): Payer: 59 | Admitting: Gastroenterology

## 2019-07-22 VITALS — BP 110/62 | HR 75 | Temp 96.8°F | Resp 9 | Ht 63.0 in | Wt 150.0 lb

## 2019-07-22 DIAGNOSIS — R194 Change in bowel habit: Secondary | ICD-10-CM

## 2019-07-22 DIAGNOSIS — Z8379 Family history of other diseases of the digestive system: Secondary | ICD-10-CM

## 2019-07-22 MED ORDER — SODIUM CHLORIDE 0.9 % IV SOLN: 500.0000 mL | Freq: Once | INTRAVENOUS | Status: DC

## 2019-07-22 NOTE — Progress Notes (Signed)
Report to PACU, RN, vss, BBS= Clear.  

## 2019-07-22 NOTE — Patient Instructions (Signed)
Thank you for letting us take care of your healthcare needs today.     YOU HAD AN ENDOSCOPIC PROCEDURE TODAY AT THE [facility]:   Refer to the procedure report that was given to you for any specific questions about what was found during the examination.  If the procedure report does not answer your questions, please call your gastroenterologist to clarify.  If you requested that your care partner not be given the details of your procedure findings, then the procedure report has been included in a sealed envelope for you to review at your convenience later.  YOU SHOULD EXPECT: Some feelings of bloating in the abdomen. Passage of more gas than usual.  Walking can help get rid of the air that was put into your GI tract during the procedure and reduce the bloating. If you had a lower endoscopy (such as a colonoscopy or flexible sigmoidoscopy) you may notice spotting of blood in your stool or on the toilet paper. If you underwent a bowel prep for your procedure, you may not have a normal bowel movement for a few days.  Please Note:  You might notice some irritation and congestion in your nose or some drainage.  This is from the oxygen used during your procedure.  There is no need for concern and it should clear up in a day or so.  SYMPTOMS TO REPORT IMMEDIATELY:   Following lower endoscopy (colonoscopy or flexible sigmoidoscopy):  Excessive amounts of blood in the stool  Significant tenderness or worsening of abdominal pains  Swelling of the abdomen that is new, acute  Fever of 100F or higher   For urgent or emergent issues, a gastroenterologist can be reached at any hour by calling [phone].   DIET:  We do recommend a small meal at first, but then you may proceed to your regular diet.  Drink plenty of fluids but you should avoid alcoholic beverages for 24 hours.  ACTIVITY:  You should plan to take it easy for the rest of today and you should NOT DRIVE or use heavy machinery  until tomorrow (because of the sedation medicines used during the test).    FOLLOW UP: Our staff will call the number listed on your records 48-72 hours following your procedure to check on you and address any questions or concerns that you may have regarding the information given to you following your procedure. If we do not reach you, we will leave a message.  We will attempt to reach you two times.  During this call, we will ask if you have developed any symptoms of COVID 19. If you develop any symptoms (ie: fever, flu-like symptoms, shortness of breath, cough etc.) before then, please call [phone].  If you test positive for Covid 19 in the 2 weeks post procedure, please call and report this information to us.    If any biopsies were taken you will be contacted by phone or by letter within the next 1-3 weeks.  Please call us at [phone] if you have not heard about the biopsies in 3 weeks.    SIGNATURES/CONFIDENTIALITY: You and/or your care partner have signed paperwork which will be entered into your electronic medical record.  These signatures attest to the fact that that the information above on your After Visit Summary has been reviewed and is understood.  Full responsibility of the confidentiality of this discharge information lies with you and/or your care-partner. 

## 2019-07-22 NOTE — Op Note (Signed)
[facility] Patient Name: Frantasia Coulibaly Procedure Date: 07/22/2019 1:29 PM MRN: [id number] Endoscopist: Viviann Spare P. Adela Lank , MD Age: 34 Referring MD:  Date of Birth: 1986-06-01 Gender: Female Account #: [id number] Procedure:                Colonoscopy Indications:              Family history of Crohn's disease in a first-degree                            relative, Change in bowel habits Medicines:                Monitored Anesthesia Care Procedure:                Pre-Anesthesia Assessment:                           - Prior to the procedure, a History and Physical                            was performed, and patient medications and                            allergies were reviewed. The patient's tolerance of                            previous anesthesia was also reviewed. The risks                            and benefits of the procedure and the sedation                            options and risks were discussed with the patient.                            All questions were answered, and informed consent                            was obtained. Prior Anticoagulants: The patient has                            taken no previous anticoagulant or antiplatelet                            agents. ASA Grade Assessment: II - A patient with                            mild systemic disease. After reviewing the risks                            and benefits, the patient was deemed in                            satisfactory condition to undergo the procedure.  After obtaining informed consent, the colonoscope                            was passed under direct vision. Throughout the                            procedure, the patient's blood pressure, pulse, and                            oxygen saturations were monitored continuously. The                            Colonoscope was introduced through the anus and                            advanced to the the  terminal ileum, with                            identification of the appendiceal orifice and IC                            valve. The colonoscopy was performed without                            difficulty. The patient tolerated the procedure                            well. The quality of the bowel preparation was                            good. The terminal ileum, ileocecal valve,                            appendiceal orifice, and rectum were photographed. Scope In: 1:31:08 PM Scope Out: 1:46:00 PM Scope Withdrawal Time: 0 hours 12 minutes 48 seconds  Total Procedure Duration: 0 hours 14 minutes 52 seconds  Findings:                 The perianal and digital rectal examinations were                            normal.                           The terminal ileum appeared normal.                           The exam was otherwise without abnormality.                           Biopsies for histology were taken with a cold                            forceps from the right colon, left colon and  transverse colon for evaluation of microscopic                            colitis. Complications:            No immediate complications. Estimated blood loss:                            Minimal. Estimated Blood Loss:     Estimated blood loss was minimal. Impression:               - The examined portion of the ileum was normal.                           - The examination was otherwise normal. No overt                            inflammatory changes. No polyps.                           - Biopsies were taken with a cold forceps from the                            right colon, left colon and transverse colon for                            evaluation of microscopic colitis. Recommendation:           - Patient has a contact number available for                            emergencies. The signs and symptoms of potential                            delayed complications were  discussed with the                            patient. Return to normal activities tomorrow.                            Written discharge instructions were provided to the                            patient.                           - Resume previous diet.                           - Continue present medications.                           - Await pathology results. Viviann Spare P. Lorayne Getchell, MD 07/22/2019 1:51:02 PM This report has been signed electronically.

## 2019-07-22 NOTE — Progress Notes (Signed)
Called to room to assist during endoscopic procedure.  Patient ID and intended procedure confirmed with present staff. Received instructions for my participation in the procedure from the performing physician.  

## 2019-07-26 ENCOUNTER — Other Ambulatory Visit: Payer: 59

## 2019-07-26 ENCOUNTER — Telehealth: Payer: Self-pay

## 2019-07-26 ENCOUNTER — Ambulatory Visit: Payer: 59 | Attending: Internal Medicine

## 2019-07-26 DIAGNOSIS — Z20822 Contact with and (suspected) exposure to covid-19: Secondary | ICD-10-CM

## 2019-07-26 NOTE — Telephone Encounter (Signed)
Left message on answering machine. 

## 2019-07-26 NOTE — Telephone Encounter (Signed)
Attempted to reach pt. With follow-up call following endoscopic procedure 07/22/19.  LM on pt. Voice mail.  Will try to reach pt. Again later today. 

## 2019-07-27 ENCOUNTER — Other Ambulatory Visit: Payer: Self-pay

## 2019-07-27 ENCOUNTER — Telehealth: Payer: Self-pay

## 2019-07-27 DIAGNOSIS — R194 Change in bowel habit: Secondary | ICD-10-CM

## 2019-07-27 LAB — NOVEL CORONAVIRUS, NAA: SARS-CoV-2, NAA: NOT DETECTED

## 2019-07-27 NOTE — Telephone Encounter (Signed)
Left message to please call back. °

## 2019-07-28 NOTE — Telephone Encounter (Signed)
See result note.  

## 2019-07-28 NOTE — Telephone Encounter (Signed)
Patient returned your call, please call patient one more time.   

## 2019-08-19 ENCOUNTER — Other Ambulatory Visit (INDEPENDENT_AMBULATORY_CARE_PROVIDER_SITE_OTHER): Payer: 59

## 2019-08-19 DIAGNOSIS — R194 Change in bowel habit: Secondary | ICD-10-CM | POA: Diagnosis not present

## 2019-08-19 LAB — IGA: IgA: 113 mg/dL (ref 68–378)

## 2019-08-20 LAB — TISSUE TRANSGLUTAMINASE, IGA: (tTG) Ab, IgA: 1 U/mL

## 2019-09-28 ENCOUNTER — Ambulatory Visit (INDEPENDENT_AMBULATORY_CARE_PROVIDER_SITE_OTHER)
Admission: RE | Admit: 2019-09-28 | Discharge: 2019-09-28 | Disposition: A | Discharge status: Home / Self Care | Payer: 59 | Source: Ambulatory Visit

## 2019-09-28 DIAGNOSIS — J0100 Acute maxillary sinusitis, unspecified: Secondary | ICD-10-CM | POA: Diagnosis not present

## 2019-09-28 MED ORDER — CETIRIZINE HCL 10 MG PO TABS: 10.0000 mg | ORAL_TABLET | Freq: Every day | ORAL | 0 refills | Status: DC

## 2019-09-28 MED ORDER — AMOXICILLIN-POT CLAVULANATE 875-125 MG PO TABS: 1.0000 | ORAL_TABLET | Freq: Two times a day (BID) | ORAL | 0 refills | Status: DC

## 2019-09-28 NOTE — Discharge Instructions (Signed)
Follow-up with PCP Return for a face-to-face visit if symptom does not resolve or is getting worse.

## 2019-09-28 NOTE — ED Provider Notes (Signed)
Virtual Visit via Video Note:  Mariah [name]  initiated request for Telemedicine visit with [facility] team. I connected with Mariah [name]  on 09/28/2019 at 630 PM  for a synchronized telemedicine visit using a video enabled HIPPA compliant telemedicine application. I verified that I am speaking with Mariah [name]  using two identifiers. Durward Parcel, FNP  was physically located in a [facility] Urgent care site and Mariah [name] was located at a different location.   The limitations of evaluation and management by telemedicine as well as the availability of in-person appointments were discussed. Patient was informed that she  may incur a bill ( including co-pay) for this virtual visit encounter. Mariah [name]  expressed understanding and gave verbal consent to proceed with virtual visit.     History of Present Illness:Mariah [name]  is a 34 y.o. female who presents via telehealth with a complaint of postnasal drainage, sore throat, cough with green mucus and facial pain for the past 4 days.  Report her partner has acute sinusitis and was prescribed Augmentin.  Denies sick exposure to COVID, flu or strep.  Denies recent travel. Denies aggravating or alleviating symptoms.  Reported previous COVID infection and recently received [employer] Covid vaccine.   Denies fever, chills, fatigue, nasal congestion, rhinorrhea,  SOB, wheezing, chest pain, nausea, vomiting, changes in bowel or bladder habits.   presents with  Past Medical History:  Diagnosis Date  . Allergy   . Anxiety state, unspecified   . Asthma   . Panic disorder without agoraphobia   . Urticaria     Allergies  Allergen Reactions  . Codeine Nausea Only and Other (See Comments)    Passes out  . Prednisone Nausea Only and Other (See Comments)    DIZZINESS.   . Vicodin [Hydrocodone-Acetaminophen] Nausea Only and Other (See Comments)    Passes out        Observations/Objective: VITALS: Per patient if  applicable, see vitals. GENERAL: Alert, appears well and in no acute distress. NEURO: CN grossly intact. Oriented as arrived to appointment on time with no prompting. Moves both UE equally.     Assessment and Plan:    ICD-10-CM   1. Acute maxillary sinusitis, recurrence not specified  J01.00 amoxicillin-clavulanate (AUGMENTIN) 875-125 MG tablet    cetirizine (ZYRTEC ALLERGY) 10 MG tablet    Follow Up Instructions:  Follow-up with PCP Return for a face-to-face visit if symptom does not resolve or is getting worse.   I discussed the assessment and treatment plan with the patient. The patient was provided an opportunity to ask questions and all were answered. The patient agreed with the plan and demonstrated an understanding of the instructions.   The patient was advised to call back or seek an in-person evaluation if the symptoms worsen or if the condition fails to improve as anticipated.  I provided 12 minutes of non-face-to-face time during this encounter.    Durward Parcel, FNP  09/28/2019 6:53 PM         Durward Parcel, FNP 09/28/19 [phone]

## 2019-09-30 ENCOUNTER — Other Ambulatory Visit: Payer: Self-pay

## 2019-09-30 ENCOUNTER — Ambulatory Visit
Admission: EM | Admit: 2019-09-30 | Discharge: 2019-09-30 | Disposition: A | Discharge status: Home / Self Care | Payer: 59 | Attending: Emergency Medicine | Admitting: Emergency Medicine

## 2019-09-30 DIAGNOSIS — J040 Acute laryngitis: Secondary | ICD-10-CM | POA: Diagnosis not present

## 2019-09-30 DIAGNOSIS — B9789 Other viral agents as the cause of diseases classified elsewhere: Secondary | ICD-10-CM | POA: Diagnosis not present

## 2019-09-30 MED ORDER — PREDNISONE 20 MG PO TABS: 20.0000 mg | ORAL_TABLET | Freq: Two times a day (BID) | ORAL | 0 refills | Status: AC

## 2019-09-30 NOTE — ED Provider Notes (Signed)
[place]   [id number] 09/30/19 Arrival Time: 0805   CC: Hoarseness  SUBJECTIVE: History from: patient.  Mariah [name] is a 34 y.o. female who presents with hoarseness, runny nose, drainage, and dry cough x 1 week.  Denies known sick exposure.  Reports previous COVID infection and received Wynetta Emery and [employer] vaccine.  Had a video visit on 09/28/19 and prescribed augmentin.  States she has taken 3 days worth of augmentin and dose of prednisone without relief.   Symptoms are made worse with talking.  Denies previous symptoms in the past.   Denies fever, chills, sore throat, SOB, wheezing, chest pain, nausea, vomiting, changes in bowel or bladder habits.    ROS: As per HPI.  All other pertinent ROS negative.     Past Medical History:  Diagnosis Date  . Allergy   . Anxiety state, unspecified   . Asthma   . Panic disorder without agoraphobia   . Urticaria    Past Surgical History:  Procedure Laterality Date  . TONSILLECTOMY     01/26/07- Dr. Redmond Baseman  . WISDOM TOOTH EXTRACTION     Allergies  Allergen Reactions  . Codeine Nausea Only and Other (See Comments)    Passes out  . Prednisone Nausea Only and Other (See Comments)    DIZZINESS.   . Vicodin [Hydrocodone-Acetaminophen] Nausea Only and Other (See Comments)    Passes out   No current facility-administered medications on file prior to encounter.   Current Outpatient Medications on File Prior to Encounter  Medication Sig Dispense Refill  . albuterol (VENTOLIN HFA) 108 (90 Base) MCG/ACT inhaler Inhale 2 puffs into the lungs every 4 (four) hours as needed for wheezing or shortness of breath. 18 g 2  . ALPRAZolam (XANAX) 0.5 MG tablet Take 0.5 mg by mouth at bedtime.    Marland Kitchen escitalopram (LEXAPRO) 10 MG tablet Take 10 mg by mouth daily.    . fluticasone (FLONASE) 50 MCG/ACT nasal spray Place 2 sprays into both nostrils daily. Please dispense generic. 16 g 5  . amoxicillin-clavulanate (AUGMENTIN) 875-125 MG tablet Take 1  tablet by mouth every 12 (twelve) hours. 14 tablet 0  . [DISCONTINUED] cetirizine (ZYRTEC ALLERGY) 10 MG tablet Take 1 tablet (10 mg total) by mouth daily. 30 tablet 0  . [DISCONTINUED] fexofenadine (ALLEGRA) 180 MG tablet Take 180 mg by mouth daily. Reported on 08/18/2015     Social History   Socioeconomic History  . Marital status: Divorced    Spouse name: Not on file  . Number of children: 0  . Years of education: Not on file  . Highest education level: Not on file  Occupational History  . Occupation: Occupational psychologist    Comment: [employer]     Comment:    Tobacco Use  . Smoking status: Never Smoker  . Smokeless tobacco: Never Used  Substance and Sexual Activity  . Alcohol use: Yes    Alcohol/week: 0.0 standard drinks    Comment: very rarely  . Drug use: No  . Sexual activity: Yes    Comment: LMP  07/16/19  Other Topics Concern  . Not on file  Social History Narrative             Social Determinants of Health   Financial Resource Strain:   . Difficulty of Paying Living Expenses:   Food Insecurity:   . Worried About Charity fundraiser in the Last Year:   . [place] in the Last Year:  Transportation Needs:   . Freight forwarder (Medical):   Marland Kitchen Lack of Transportation (Non-Medical):   Physical Activity:   . Days of Exercise per Week:   . Minutes of Exercise per Session:   Stress:   . Feeling of Stress :   Social Connections:   . Frequency of Communication with Friends and Family:   . Frequency of Social Gatherings with Friends and Family:   . Attends Religious Services:   . Active Member of Clubs or Organizations:   . Attends Banker Meetings:   Marland Kitchen Marital Status:   Intimate Partner Violence:   . Fear of Current or Ex-Partner:   . Emotionally Abused:   Marland Kitchen Physically Abused:   . Sexually Abused:    Family History  Problem Relation Age of Onset  . Hypertension Father   . Drug abuse Father   . Arthritis Father   . Crohn's disease Father    . Depression Mother   . Diabetes Other   . Cancer Other   . Stroke Other   . Congestive Heart Failure Paternal Grandmother     OBJECTIVE:  Vitals:   09/30/19 0811  BP: 121/79  Pulse: 96  Resp: 16  Temp: 98.8 F (37.1 C)  TempSrc: Oral  SpO2: 98%     General appearance: alert; appears fatigued, but nontoxic; speaking in full sentences and tolerating own secretions HEENT: NCAT; Ears: EACs clear, TMs pearly gray; Eyes: PERRL.  EOM grossly intact. Nose: nares patent without rhinorrhea, Throat: oropharynx clear, tonsils non erythematous or enlarged, uvula midline, voice hoarse Neck: supple without LAD Lungs: unlabored respirations, symmetrical air entry; cough: absent; no respiratory distress; CTAB Heart: regular rate and rhythm.   Skin: warm and dry Psychological: alert and cooperative; normal mood and affect  ASSESSMENT & PLAN:  1. Viral laryngitis     Meds ordered this encounter  Medications  . predniSONE (DELTASONE) 20 MG tablet    Sig: Take 1 tablet (20 mg total) by mouth 2 (two) times daily with a meal for 5 days.    Dispense:  10 tablet    Refill:  0    Order Specific Question:   Supervising Provider    Answer:   Eustace Moore [[id number]]   Rest and push fluids Continue with antibiotic as prescribed and to completion Prednisone prescribed.  Take as directed and to completion.   Use OTC zyrtec for nasal congestion, runny nose, and/or sore throat Use OTC flonase for nasal congestion and runny nose Use medications daily for symptom relief Use OTC medications like ibuprofen or tylenol as needed fever or pain Call or go to the ED if you have any new or worsening symptoms such as fever, nasal congestion, runny nose, cough, shortness of breath, chest tightness, chest pain, turning blue, changes in mental status, etc...   Reviewed expectations re: course of current medical issues. Questions answered. Outlined signs and symptoms indicating need for more acute  intervention. Patient verbalized understanding. After Visit Summary given.         Alvino Chapel [place], PA-C 09/30/19 [phone]

## 2019-09-30 NOTE — Discharge Instructions (Addendum)
Rest and push fluids Continue with antibiotic as prescribed and to completion Prednisone prescribed.  Take as directed and to completion.   Use OTC zyrtec for nasal congestion, runny nose, and/or sore throat Use OTC flonase for nasal congestion and runny nose Use medications daily for symptom relief Use OTC medications like ibuprofen or tylenol as needed fever or pain Call or go to the ED if you have any new or worsening symptoms such as fever, nasal congestion, runny nose, cough, shortness of breath, chest tightness, chest pain, turning blue, changes in mental status, etc..Marland Kitchen

## 2019-09-30 NOTE — ED Triage Notes (Signed)
Pt presents with complaints of hoarseness and drainage x 1 week. Reports that she has taken 3 days of Augmentin and feels it is not working. States she had a prednisone left and took that and it did help. Denies any other symptoms.

## 2020-01-25 LAB — OB RESULTS CONSOLE RUBELLA ANTIBODY, IGM: Rubella: IMMUNE

## 2020-01-25 LAB — OB RESULTS CONSOLE GC/CHLAMYDIA
Chlamydia: NEGATIVE
Gonorrhea: NEGATIVE

## 2020-01-25 LAB — OB RESULTS CONSOLE HEPATITIS B SURFACE ANTIGEN: Hepatitis B Surface Ag: NEGATIVE

## 2020-01-25 LAB — OB RESULTS CONSOLE RPR: RPR: NONREACTIVE

## 2020-06-24 NOTE — L&D Delivery Note (Signed)
Delivery Note Pt pushed for 2.5hrs over four hours ( had a 1.5hr break) with gradual but consistent descent. She had one prolonged deceleration for 4 mins at about 920pm; resolved with position changes.  At 10:35 PM a viable female was delivered via Vaginal, Spontaneous (Presentation: Right Occiput Anterior).  APGAR: 8, 9; weight pending . Upon delivery of infants head, a nuchal x 1 was noted and reduced over her head. Anterior and posterior shoulders delivered with next two pushes; body followed easily.    Placenta status: Spontaneous, Intact. [place]  Cord: 3 vessels with the following complications: None.  Cord pH: n/a  Anesthesia: Epidural Episiotomy: None Lacerations: 2nd degree;Perineal Suture Repair: 2.0 vicryl 4-0 vicryl  Est. Blood Loss (mL): 239  Mom to postpartum.  Baby to Couplet care / Skin to Skin.  Cathrine Muster 08/07/2020, 11:04 PM

## 2020-06-29 ENCOUNTER — Inpatient Hospital Stay (HOSPITAL_COMMUNITY): Admit: 2020-06-29 | Payer: 59 | Admitting: Obstetrics and Gynecology

## 2020-07-17 ENCOUNTER — Encounter: Payer: Self-pay | Admitting: Physician Assistant

## 2020-07-17 ENCOUNTER — Telehealth: Payer: 59 | Admitting: Physician Assistant

## 2020-07-17 DIAGNOSIS — J0110 Acute frontal sinusitis, unspecified: Secondary | ICD-10-CM

## 2020-07-17 MED ORDER — AMOXICILLIN 500 MG PO CAPS: 500.0000 mg | ORAL_CAPSULE | Freq: Three times a day (TID) | ORAL | 0 refills | Status: DC

## 2020-07-17 NOTE — Progress Notes (Signed)
I connected with  Mariah [name] on 07/17/20 by a video enabled telemedicine application and verified that I am speaking with the correct person using two identifiers.   I discussed the limitations of evaluation and management by telemedicine. The patient expressed understanding and agreed to proceed. Patient was unable to connect through video, agreed to continue with audio only.   New Patient Office Visit  Subjective:  Patient ID: Mariah [name], female    DOB: [birth year]  Age: 35 y.o. MRN: [id number]  CC:  Chief Complaint  Patient presents with  . Sinusitis  Virtual Visit via Telephone Note  I connected with Mariah [name] on 07/17/20 at  7:00 PM EST by telephone and verified that I am speaking with the correct person using two identifiers.  Location: Patient: Home Provider: Working remotely from home   I discussed the limitations, risks, security and privacy concerns of performing an evaluation and management service by telephone and the availability of in person appointments. I also discussed with the patient that there may be a patient responsible charge related to this service. The patient expressed understanding and agreed to proceed.   History of Present Illness: Reports that she started having sinus congestion, and pressure 5 days ago.  Reports that she has been having greenish, brownish and now blood-tinged nasal discharge.  Reports that she has been having difficulty sleeping because of the nasal congestion.  Reports that she has been using Sudafed, [employer] and Tylenol without much relief.  States a brief unmeasured fever at the beginning of her symptoms.  Otherwise denies any other upper respiratory symptoms.  Denies any sick contacts  Reports that she is currently [redacted] weeks pregnant.    Reports that she has had 2 Covid vaccines, states that she had her second 1 5 days ago, the day that her symptoms began.  Reports that she has taken to home Covid test, the second 1 today,  states they were both negative.    Observations/Objective: Medical history and current medications reviewed, no physical exam completed      Past Medical History:  Diagnosis Date  . Allergy   . Anxiety state, unspecified   . Asthma   . Panic disorder without agoraphobia   . Urticaria     Past Surgical History:  Procedure Laterality Date  . TONSILLECTOMY     01/26/07- Dr. Jenne Pane  . WISDOM TOOTH EXTRACTION      Family History  Problem Relation Age of Onset  . Hypertension Father   . Drug abuse Father   . Arthritis Father   . Crohn's disease Father   . Depression Mother   . Diabetes Other   . Cancer Other   . Stroke Other   . Congestive Heart Failure Paternal Grandmother     Social History   Socioeconomic History  . Marital status: Married    Spouse name: Not on file  . Number of children: 0  . Years of education: Not on file  . Highest education level: Not on file  Occupational History  . Occupation: Associate Professor    Comment: [employer]     Comment:    Tobacco Use  . Smoking status: Never Smoker  . Smokeless tobacco: Never Used  Vaping Use  . Vaping Use: Never used  Substance and Sexual Activity  . Alcohol use: Yes    Alcohol/week: 0.0 standard drinks    Comment: very rarely  . Drug use: No  . Sexual activity: Yes  Comment: LMP  07/16/19  Other Topics Concern  . Not on file  Social History Narrative             Social Determinants of Health   Financial Resource Strain: Not on file  Food Insecurity: Not on file  Transportation Needs: Not on file  Physical Activity: Not on file  Stress: Not on file  Social Connections: Not on file  Intimate Partner Violence: Not on file    ROS Review of Systems  Constitutional: Negative for chills and fever.  HENT: Positive for congestion, postnasal drip, sinus pressure and sinus pain. Negative for sore throat and trouble swallowing.   Eyes: Negative.   Respiratory: Negative for cough, shortness of  breath and wheezing.   Cardiovascular: Negative.   Gastrointestinal: Negative.   Endocrine: Negative.   Genitourinary: Negative.   Musculoskeletal: Negative.   Skin: Negative.   Allergic/Immunologic: Negative.   Neurological: Positive for headaches. Negative for dizziness.  Hematological: Negative.   Psychiatric/Behavioral: Negative.     Objective:   Today's Vitals: There were no vitals taken for this visit.    Assessment & Plan:   Problem List Items Addressed This Visit   None     Outpatient Encounter Medications as of 07/17/2020  Medication Sig  . albuterol (VENTOLIN HFA) 108 (90 Base) MCG/ACT inhaler Inhale 2 puffs into the lungs every 4 (four) hours as needed for wheezing or shortness of breath.  . ALPRAZolam (XANAX) 0.5 MG tablet Take 0.5 mg by mouth at bedtime.  Marland Kitchen amoxicillin-clavulanate (AUGMENTIN) 875-125 MG tablet Take 1 tablet by mouth every 12 (twelve) hours.  Marland Kitchen escitalopram (LEXAPRO) 10 MG tablet Take 10 mg by mouth daily.  . fluticasone (FLONASE) 50 MCG/ACT nasal spray Place 2 sprays into both nostrils daily. Please dispense generic.  . [DISCONTINUED] cetirizine (ZYRTEC ALLERGY) 10 MG tablet Take 1 tablet (10 mg total) by mouth daily.  . [DISCONTINUED] fexofenadine (ALLEGRA) 180 MG tablet Take 180 mg by mouth daily. Reported on 08/18/2015   No facility-administered encounter medications on file as of 07/17/2020.   Assessment and Plan:  1. Acute non-recurrent frontal sinusitis Patient education given, continue proper hydration, rest, trial of amoxicillin, trial Flonase, patient has Flonase at home no prescription needed for that.  Red flags given for prompt reevaluation. - amoxicillin (AMOXIL) 500 MG capsule; Take 1 capsule (500 mg total) by mouth 3 (three) times daily.  Dispense: 30 capsule; Refill: 0    Follow Up Instructions:    I discussed the assessment and treatment plan with the patient. The patient was provided an opportunity to ask questions and all  were answered. The patient agreed with the plan and demonstrated an understanding of the instructions.   The patient was advised to call back or seek an in-person evaluation if the symptoms worsen or if the condition fails to improve as anticipated.  I provided 21 minutes of non-face-to-face time during this encounter.   Rashanda Magloire S [place], PA-C     Follow-up: No follow-ups on file.   Kasandra Knudsen Yulianna Folse, PA-C

## 2020-07-17 NOTE — Patient Instructions (Signed)
I encourage you to use your Flonase, stay well-hydrated, I sent an antibiotic to your pharmacy.   I hope that you feel better soon, please let us know if there is anything else we can do for you  Roney Jaffe, PA-C Physician Assistant [facility] [email or web address]    Sinusitis, Adult Sinusitis is inflammation of your sinuses. Sinuses are hollow spaces in the bones around your face. Your sinuses are located:  Around your eyes.  In the middle of your forehead.  Behind your nose.  In your cheekbones. Mucus normally drains out of your sinuses. When your nasal tissues become inflamed or swollen, mucus can become trapped or blocked. This allows bacteria, viruses, and fungi to grow, which leads to infection. Most infections of the sinuses are caused by a virus. Sinusitis can develop quickly. It can last for up to 4 weeks (acute) or for more than 12 weeks (chronic). Sinusitis often develops after a cold. What are the causes? This condition is caused by anything that creates swelling in the sinuses or stops mucus from draining. This includes:  Allergies.  Asthma.  Infection from bacteria or viruses.  Deformities or blockages in your nose or sinuses.  Abnormal growths in the nose (nasal polyps).  Pollutants, such as chemicals or irritants in the air.  Infection from fungi (rare). What increases the risk? You are more likely to develop this condition if you:  Have a weak body defense system (immune system).  Do a lot of swimming or diving.  Overuse nasal sprays.  Smoke. What are the signs or symptoms? The main symptoms of this condition are pain and a feeling of pressure around the affected sinuses. Other symptoms include:  Stuffy nose or congestion.  Thick drainage from your nose.  Swelling and warmth over the affected sinuses.  Headache.  Upper toothache.  A cough that may get worse at night.  Extra  mucus that collects in the throat or the back of the nose (postnasal drip).  Decreased sense of smell and taste.  Fatigue.  A fever.  Sore throat.  Bad breath. How is this diagnosed? This condition is diagnosed based on:  Your symptoms.  Your medical history.  A physical exam.  Tests to find out if your condition is acute or chronic. This may include: ? Checking your nose for nasal polyps. ? Viewing your sinuses using a device that has a light (endoscope). ? Testing for allergies or bacteria. ? Imaging tests, such as an MRI or CT scan. In rare cases, a bone biopsy may be done to rule out more serious types of fungal sinus disease. How is this treated? Treatment for sinusitis depends on the cause and whether your condition is chronic or acute.  If caused by a virus, your symptoms should go away on their own within 10 days. You may be given medicines to relieve symptoms. They include: ? Medicines that shrink swollen nasal passages (topical intranasal decongestants). ? Medicines that treat allergies (antihistamines). ? A spray that eases inflammation of the nostrils (topical intranasal corticosteroids). ? Rinses that help get rid of thick mucus in your nose (nasal saline washes).  If caused by bacteria, your health care provider may recommend waiting to see if your symptoms improve. Most bacterial infections will get better without antibiotic medicine. You may be given antibiotics if you have: ? A severe infection. ? A weak immune system.  If caused by narrow nasal passages or nasal polyps, you may need to have surgery. Follow  these instructions at home: Medicines  Take, use, or apply over-the-counter and prescription medicines only as told by your health care provider. These may include nasal sprays.  If you were prescribed an antibiotic medicine, take it as told by your health care provider. Do not stop taking the antibiotic even if you start to feel better. Hydrate and  humidify  Drink enough fluid to keep your urine pale yellow. Staying hydrated will help to thin your mucus.  Use a cool mist humidifier to keep the humidity level in your home above 50%.  Inhale steam for 10-15 minutes, 3-4 times a day, or as told by your health care provider. You can do this in the bathroom while a hot shower is running.  Limit your exposure to cool or dry air.   Rest  Rest as much as possible.  Sleep with your head raised (elevated).  Make sure you get enough sleep each night. General instructions  Apply a warm, moist washcloth to your face 3-4 times a day or as told by your health care provider. This will help with discomfort.  Wash your hands often with soap and water to reduce your exposure to germs. If soap and water are not available, use hand sanitizer.  Do not smoke. Avoid being around people who are smoking (secondhand smoke).  Keep all follow-up visits as told by your health care provider. This is important.   Contact a health care provider if:  You have a fever.  Your symptoms get worse.  Your symptoms do not improve within 10 days. Get help right away if:  You have a severe headache.  You have persistent vomiting.  You have severe pain or swelling around your face or eyes.  You have vision problems.  You develop confusion.  Your neck is stiff.  You have trouble breathing. Summary  Sinusitis is soreness and inflammation of your sinuses. Sinuses are hollow spaces in the bones around your face.  This condition is caused by nasal tissues that become inflamed or swollen. The swelling traps or blocks the flow of mucus. This allows bacteria, viruses, and fungi to grow, which leads to infection.  If you were prescribed an antibiotic medicine, take it as told by your health care provider. Do not stop taking the antibiotic even if you start to feel better.  Keep all follow-up visits as told by your health care provider. This is  important. This information is not intended to replace advice given to you by your health care provider. Make sure you discuss any questions you have with your health care provider. Document Revised: 11/10/2017 Document Reviewed: 11/10/2017 Elsevier Patient Education  2021 [employer].

## 2020-08-07 ENCOUNTER — Inpatient Hospital Stay (HOSPITAL_COMMUNITY): Payer: 59 | Admitting: Anesthesiology

## 2020-08-07 ENCOUNTER — Inpatient Hospital Stay (HOSPITAL_COMMUNITY)
Admission: AD | Admit: 2020-08-07 | Discharge: 2020-08-07 | Disposition: A | Discharge status: Other Acute Inpatient Hospital | Payer: 59 | Attending: Obstetrics and Gynecology | Admitting: Obstetrics and Gynecology

## 2020-08-07 ENCOUNTER — Encounter (HOSPITAL_COMMUNITY): Payer: Self-pay | Admitting: Obstetrics and Gynecology

## 2020-08-07 ENCOUNTER — Inpatient Hospital Stay (HOSPITAL_COMMUNITY)
Admission: AD | Admit: 2020-08-07 | Discharge: 2020-08-09 | DRG: 805 | Disposition: A | Discharge status: Home / Self Care | Payer: 59 | Attending: Obstetrics and Gynecology | Admitting: Obstetrics and Gynecology

## 2020-08-07 ENCOUNTER — Other Ambulatory Visit: Payer: Self-pay

## 2020-08-07 DIAGNOSIS — O26893 Other specified pregnancy related conditions, third trimester: Secondary | ICD-10-CM | POA: Diagnosis present

## 2020-08-07 DIAGNOSIS — U071 COVID-19: Secondary | ICD-10-CM | POA: Diagnosis present

## 2020-08-07 DIAGNOSIS — Z3A38 38 weeks gestation of pregnancy: Secondary | ICD-10-CM

## 2020-08-07 DIAGNOSIS — O9852 Other viral diseases complicating childbirth: Secondary | ICD-10-CM | POA: Diagnosis present

## 2020-08-07 LAB — TYPE AND SCREEN
ABO/RH(D): A POS
Antibody Screen: NEGATIVE

## 2020-08-07 LAB — RESP PANEL BY RT-PCR (FLU A&B, COVID) ARPGX2
Influenza A by PCR: NEGATIVE
Influenza B by PCR: NEGATIVE
SARS Coronavirus 2 by RT PCR: POSITIVE — AB

## 2020-08-07 LAB — CBC
HCT: 39.1 % (ref 36.0–46.0)
Hemoglobin: 12.7 g/dL (ref 12.0–15.0)
MCH: 26.8 pg (ref 26.0–34.0)
MCHC: 32.5 g/dL (ref 30.0–36.0)
MCV: 82.7 fL (ref 80.0–100.0)
Platelets: 250 10*3/uL (ref 150–400)
RBC: 4.73 MIL/uL (ref 3.87–5.11)
RDW: 14.3 % (ref 11.5–15.5)
WBC: 16.7 10*3/uL — ABNORMAL HIGH (ref 4.0–10.5)
nRBC: 0 % (ref 0.0–0.2)

## 2020-08-07 LAB — RPR: RPR Ser Ql: NONREACTIVE

## 2020-08-07 MED ORDER — OXYTOCIN-SODIUM CHLORIDE 30-0.9 UT/500ML-% IV SOLN
1.0000 m[IU]/min | INTRAVENOUS | Status: DC
  Administered 2020-08-07: 2 m[IU]/min via INTRAVENOUS

## 2020-08-07 MED ORDER — FENTANYL-BUPIVACAINE-NACL 0.5-0.125-0.9 MG/250ML-% EP SOLN
12.0000 mL/h | EPIDURAL | Status: DC | PRN
  Administered 2020-08-07: 12 mL/h via EPIDURAL
  Filled 2020-08-07: qty 250

## 2020-08-07 MED ORDER — OXYTOCIN BOLUS FROM INFUSION
333.0000 mL | Freq: Once | INTRAVENOUS | Status: AC
  Administered 2020-08-07: 333 mL via INTRAVENOUS

## 2020-08-07 MED ORDER — FENTANYL-BUPIVACAINE-NACL 0.5-0.125-0.9 MG/250ML-% EP SOLN
EPIDURAL | Status: DC | PRN
  Administered 2020-08-07: 12 mL/h via EPIDURAL

## 2020-08-07 MED ORDER — DIPHENHYDRAMINE HCL 50 MG/ML IJ SOLN: 12.5000 mg | INTRAMUSCULAR | Status: DC | PRN

## 2020-08-07 MED ORDER — PHENYLEPHRINE 40 MCG/ML (10ML) SYRINGE FOR IV PUSH (FOR BLOOD PRESSURE SUPPORT): 80.0000 ug | PREFILLED_SYRINGE | INTRAVENOUS | Status: DC | PRN

## 2020-08-07 MED ORDER — OXYTOCIN-SODIUM CHLORIDE 30-0.9 UT/500ML-% IV SOLN
2.5000 [IU]/h | INTRAVENOUS | Status: DC
  Filled 2020-08-07: qty 500

## 2020-08-07 MED ORDER — EPHEDRINE 5 MG/ML INJ: 10.0000 mg | INTRAVENOUS | Status: DC | PRN

## 2020-08-07 MED ORDER — ONDANSETRON HCL 4 MG/2ML IJ SOLN
INTRAMUSCULAR | Status: AC
  Filled 2020-08-07: qty 2

## 2020-08-07 MED ORDER — LIDOCAINE HCL (PF) 1 % IJ SOLN
INTRAMUSCULAR | Status: DC | PRN
  Administered 2020-08-07: 5 mL via EPIDURAL

## 2020-08-07 MED ORDER — LACTATED RINGERS IV SOLN
500.0000 mL | INTRAVENOUS | Status: DC | PRN
Start: 2020-08-07 — End: 2020-08-08
  Administered 2020-08-07 (×2): 1000 mL via INTRAVENOUS
  Administered 2020-08-07: 250 mL via INTRAVENOUS

## 2020-08-07 MED ORDER — TERBUTALINE SULFATE 1 MG/ML IJ SOLN: 0.2500 mg | Freq: Once | INTRAMUSCULAR | Status: DC | PRN

## 2020-08-07 MED ORDER — SOD CITRATE-CITRIC ACID 500-334 MG/5ML PO SOLN: 30.0000 mL | ORAL | Status: DC | PRN

## 2020-08-07 MED ORDER — ONDANSETRON HCL 4 MG/2ML IJ SOLN
4.0000 mg | Freq: Four times a day (QID) | INTRAMUSCULAR | Status: DC | PRN
  Administered 2020-08-07 (×2): 4 mg via INTRAVENOUS
  Filled 2020-08-07: qty 2

## 2020-08-07 MED ORDER — FENTANYL-BUPIVACAINE-NACL 0.5-0.125-0.9 MG/250ML-% EP SOLN
12.0000 mL/h | EPIDURAL | Status: DC | PRN
  Filled 2020-08-07: qty 250

## 2020-08-07 MED ORDER — LACTATED RINGERS IV SOLN: INTRAVENOUS | Status: DC

## 2020-08-07 MED ORDER — FLEET ENEMA 7-19 GM/118ML RE ENEM: 1.0000 | ENEMA | RECTAL | Status: DC | PRN

## 2020-08-07 MED ORDER — LIDOCAINE HCL (PF) 1 % IJ SOLN: 30.0000 mL | INTRAMUSCULAR | Status: DC | PRN

## 2020-08-07 MED ORDER — LACTATED RINGERS IV SOLN: 500.0000 mL | Freq: Once | INTRAVENOUS | Status: DC

## 2020-08-07 NOTE — MAU Note (Signed)
PT SAYS SHE HAS HAD BLOODY SHOW TODAY . UC STRONG SINCE 2230. VE IN OFFICE LAST Thursday- CLOSED.  DENIES HSV AND MRSA. GBS- NEG

## 2020-08-07 NOTE — H&P (Signed)
Mariah [name] is a 35 y.o. female presenting for worsening contractions. Q85m, strong. +FM, denies VB, LOF. GBS neg  PNC s/f  Anx/dep not on meds. OB History    Gravida  1   Para      Term      Preterm      AB      Living        SAB      IAB      Ectopic      Multiple      Live Births             Past Medical History:  Diagnosis Date  . Allergy   . Anxiety state, unspecified   . Asthma   . Panic disorder without agoraphobia   . Urticaria    Past Surgical History:  Procedure Laterality Date  . TONSILLECTOMY     01/26/07- Dr. Jenne Pane  . WISDOM TOOTH EXTRACTION     Family History: family history includes Arthritis in her father; Cancer in an other family member; Congestive Heart Failure in her paternal grandmother; Crohn's disease in her father; Depression in her mother; Diabetes in an other family member; Drug abuse in her father; Hypertension in her father; Stroke in an other family member. Social History:  reports that she has never smoked. She has never used smokeless tobacco. She reports current alcohol use. She reports that she does not use drugs.     Maternal Diabetes: No 1hr 125 Genetic Screening: Normal Maternal Ultrasounds/Referrals: Normal Fetal Ultrasounds or other Referrals:  None Maternal Substance Abuse:  No Significant Maternal Medications:  None Significant Maternal Lab Results:  Group B Strep negative Other Comments:  None  Review of Systems  Constitutional: Negative for chills and fever.  Respiratory: Negative for shortness of breath.   Cardiovascular: Negative for chest pain, palpitations and leg swelling.  Gastrointestinal: Negative for abdominal pain (w/ contractions) and vomiting.  Neurological: Negative for dizziness, weakness and headaches.  Psychiatric/Behavioral: Negative for suicidal ideas.   Maternal Medical History:  Reason for admission: Contractions.   Contractions: Onset was less than 1 hour ago.   Frequency: regular.    Perceived severity is moderate.    Fetal activity: Perceived fetal activity is normal.    Prenatal Complications - Diabetes: none.    Dilation: 2.5 Effacement (%): 80 Station: 0 Exam by:: CO'Rear Blood pressure 111/67, pulse 87, temperature 98.5 F (36.9 C), temperature source Oral, resp. rate 20, height 5\' 2"  (1.575 m), weight 82.5 kg, last menstrual period 09/13/2019, SpO2 97 %. Exam Physical Exam Constitutional:      General: She is not in acute distress.    Appearance: She is well-developed and well-nourished.  HENT:     Head: Normocephalic and atraumatic.  Eyes:     Pupils: Pupils are equal, round, and reactive to light.  Cardiovascular:     Rate and Rhythm: Normal rate and regular rhythm.     Heart sounds: No murmur heard. No gallop.   Abdominal:     Tenderness: There is no abdominal tenderness. There is no guarding or rebound.  Genitourinary:    Vagina: Normal.     Uterus: Normal.   Musculoskeletal:        General: Normal range of motion.     Cervical back: Normal range of motion and neck supple.  Skin:    General: Skin is warm and dry.  Neurological:     Mental Status: She is alert and oriented to person, place,  and time.     Prenatal labs: ABO, Rh: --/--/A POS (02/14 7902) Antibody: NEG (02/14 0346) Rubella:  imm RPR:   nr HBsAg:   neg HIV:   nr GBS:   neg  Catagory 1 tracing TOCO q2-9m  Assessment/Plan: This is a 34yo G1P0 @ 38 2/7 by LMP c/w 10wk scan presenting for rule out labor. Made change form closed to 2/90/0, painfully and regularly contracting. Admitted, is now s/p epidural, CE 2-3/80/0. Olan for AROM, augment as needed with pitocin. GBS neg  Of note, admission screening COVID came back positive, pt asx. Airborne precautions   Valerie Roys Lyrick Worland 08/07/2020, 6:00 AM

## 2020-08-07 NOTE — Progress Notes (Signed)
Patient ID: Mariah [name], female   DOB: 03/03/1986, 35 y.o.   MRN: [id number] Pt comfortable with epidural. Not appreciating ctxs. +Fms VSS GEN - NAD, anxious EFM - 145, cat 1 TOCO- ctxs q 1-19mins SVE 4/100/-2  A/P: Prime at term progressing in labor spontaneously, asymptomatic covid +         - AROM performed with moderate amount of clear but blood tinged fluid         - Augment with pitocin if indicated after an hour         - Anticipate svd

## 2020-08-07 NOTE — Progress Notes (Signed)
Patient ID: Mariah [name], female   DOB: 16-Jul-1985, 35 y.o.   MRN: [id number] Pt comfortable with epidural but appreciating pressure with contractions. C/O nausea VSS GEN - NAD  EFM - 145, cat 1 TOCO - contractions irregularly q 1-86mins SVE - anterior lip   A/P: Progressing well in labor         Attempted pushing but unable to reduce lip and no descent         Labor down x 1 hr then retry

## 2020-08-07 NOTE — Anesthesia Preprocedure Evaluation (Signed)
Anesthesia Evaluation  Patient identified by MRN, date of birth, ID band Patient awake    Reviewed: Allergy & Precautions, NPO status , Patient's Chart, lab work & pertinent test results  Airway Mallampati: II  TM Distance: >3 FB Neck ROM: Full    Dental no notable dental hx. (+) Teeth Intact, Dental Advisory Given   Pulmonary asthma ,    Pulmonary exam normal breath sounds clear to auscultation       Cardiovascular Exercise Tolerance: Good negative cardio ROS Normal cardiovascular exam Rhythm:Regular Rate:Normal     Neuro/Psych PSYCHIATRIC DISORDERS Anxiety    GI/Hepatic negative GI ROS, Neg liver ROS,   Endo/Other  negative endocrine ROS  Renal/GU negative Renal ROS     Musculoskeletal   Abdominal   Peds  Hematology Lab Results      Component                Value               Date                      WBC                      16.7 (H)            08/07/2020                HGB                      12.7                08/07/2020                HCT                      39.1                08/07/2020                MCV                      82.7                08/07/2020                PLT                      250                 08/07/2020              Anesthesia Other Findings   Reproductive/Obstetrics (+) Pregnancy                             Anesthesia Physical Anesthesia Plan  ASA: II  Anesthesia Plan: Epidural   Post-op Pain Management:    Induction:   PONV Risk Score and Plan:   Airway Management Planned:   Additional Equipment:   Intra-op Plan:   Post-operative Plan:   Informed Consent: I have reviewed the patients History and Physical, chart, labs and discussed the procedure including the risks, benefits and alternatives for the proposed anesthesia with the patient or authorized representative who has indicated his/her understanding and acceptance.       Plan  Discussed with:   Anesthesia Plan Comments: (38.2 Wk G1P0  for LEA)        Anesthesia Quick Evaluation

## 2020-08-08 ENCOUNTER — Encounter (HOSPITAL_COMMUNITY): Payer: Self-pay | Admitting: Obstetrics and Gynecology

## 2020-08-08 LAB — CBC
HCT: 32.5 % — ABNORMAL LOW (ref 36.0–46.0)
Hemoglobin: 10.7 g/dL — ABNORMAL LOW (ref 12.0–15.0)
MCH: 27.3 pg (ref 26.0–34.0)
MCHC: 32.9 g/dL (ref 30.0–36.0)
MCV: 82.9 fL (ref 80.0–100.0)
Platelets: 191 10*3/uL (ref 150–400)
RBC: 3.92 MIL/uL (ref 3.87–5.11)
RDW: 14.5 % (ref 11.5–15.5)
WBC: 22 10*3/uL — ABNORMAL HIGH (ref 4.0–10.5)
nRBC: 0 % (ref 0.0–0.2)

## 2020-08-08 MED ORDER — DIBUCAINE (PERIANAL) 1 % EX OINT: 1.0000 "application " | TOPICAL_OINTMENT | CUTANEOUS | Status: DC | PRN

## 2020-08-08 MED ORDER — TETANUS-DIPHTH-ACELL PERTUSSIS 5-2.5-18.5 LF-MCG/0.5 IM SUSY: 0.5000 mL | PREFILLED_SYRINGE | Freq: Once | INTRAMUSCULAR | Status: DC

## 2020-08-08 MED ORDER — ONDANSETRON HCL 4 MG PO TABS: 4.0000 mg | ORAL_TABLET | ORAL | Status: DC | PRN

## 2020-08-08 MED ORDER — OXYTOCIN-SODIUM CHLORIDE 30-0.9 UT/500ML-% IV SOLN: 2.5000 [IU]/h | INTRAVENOUS | Status: DC | PRN

## 2020-08-08 MED ORDER — SODIUM CHLORIDE 0.9 % IV SOLN: INTRAVENOUS | Status: DC

## 2020-08-08 MED ORDER — COCONUT OIL OIL
1.0000 "application " | TOPICAL_OIL | Status: DC | PRN
  Administered 2020-08-08: 1 via TOPICAL

## 2020-08-08 MED ORDER — SENNOSIDES-DOCUSATE SODIUM 8.6-50 MG PO TABS
2.0000 | ORAL_TABLET | Freq: Every day | ORAL | Status: DC
  Administered 2020-08-08 – 2020-08-09 (×2): 2 via ORAL
  Filled 2020-08-08 (×2): qty 2

## 2020-08-08 MED ORDER — LORATADINE 10 MG PO TABS
10.0000 mg | ORAL_TABLET | Freq: Every day | ORAL | Status: DC
  Administered 2020-08-08 – 2020-08-09 (×2): 10 mg via ORAL
  Filled 2020-08-08 (×2): qty 1

## 2020-08-08 MED ORDER — BENZOCAINE-MENTHOL 20-0.5 % EX AERO
1.0000 "application " | INHALATION_SPRAY | CUTANEOUS | Status: DC | PRN
  Administered 2020-08-09: 1 via TOPICAL
  Filled 2020-08-08 (×2): qty 56

## 2020-08-08 MED ORDER — ZOLPIDEM TARTRATE 5 MG PO TABS: 5.0000 mg | ORAL_TABLET | Freq: Every evening | ORAL | Status: DC | PRN

## 2020-08-08 MED ORDER — PANTOPRAZOLE SODIUM 40 MG PO TBEC
40.0000 mg | DELAYED_RELEASE_TABLET | Freq: Every day | ORAL | Status: DC
  Administered 2020-08-08 – 2020-08-09 (×2): 40 mg via ORAL
  Filled 2020-08-08 (×2): qty 1

## 2020-08-08 MED ORDER — DIPHENHYDRAMINE HCL 25 MG PO CAPS: 25.0000 mg | ORAL_CAPSULE | Freq: Four times a day (QID) | ORAL | Status: DC | PRN

## 2020-08-08 MED ORDER — ALBUTEROL SULFATE (2.5 MG/3ML) 0.083% IN NEBU: 2.5000 mg | INHALATION_SOLUTION | RESPIRATORY_TRACT | Status: DC | PRN

## 2020-08-08 MED ORDER — ALBUTEROL SULFATE HFA 108 (90 BASE) MCG/ACT IN AERS: 2.0000 | INHALATION_SPRAY | RESPIRATORY_TRACT | Status: DC | PRN

## 2020-08-08 MED ORDER — IBUPROFEN 600 MG PO TABS
600.0000 mg | ORAL_TABLET | Freq: Four times a day (QID) | ORAL | Status: DC
  Administered 2020-08-08 – 2020-08-09 (×6): 600 mg via ORAL
  Filled 2020-08-08 (×6): qty 1

## 2020-08-08 MED ORDER — NAPROXEN 250 MG PO TABS
500.0000 mg | ORAL_TABLET | Freq: Two times a day (BID) | ORAL | Status: DC | PRN
  Administered 2020-08-08: 500 mg via ORAL
  Filled 2020-08-08 (×2): qty 2

## 2020-08-08 MED ORDER — ONDANSETRON HCL 4 MG/2ML IJ SOLN: 4.0000 mg | INTRAMUSCULAR | Status: DC | PRN

## 2020-08-08 MED ORDER — WITCH HAZEL-GLYCERIN EX PADS: 1.0000 "application " | MEDICATED_PAD | CUTANEOUS | Status: DC | PRN

## 2020-08-08 MED ORDER — PRENATAL MULTIVITAMIN CH
1.0000 | ORAL_TABLET | Freq: Every day | ORAL | Status: DC
  Administered 2020-08-08: 1 via ORAL
  Filled 2020-08-08: qty 1

## 2020-08-08 MED ORDER — SIMETHICONE 80 MG PO CHEW
80.0000 mg | CHEWABLE_TABLET | ORAL | Status: DC | PRN
  Administered 2020-08-08: 80 mg via ORAL
  Filled 2020-08-08: qty 1

## 2020-08-08 NOTE — Lactation Note (Signed)
This note was copied from a baby's chart. Lactation Consultation Note  Patient Name: Girl Kymberly Blomberg [id number] Date: 08/08/2020 Reason for consult: 1st time breastfeeding;Early term 37-38.6wks Age:34 hours P1, ETI female infant. LC changed stool while in room, infant had 3 stools and one void since delivery. Mom was offering infant formula when LC walked in room, mom had given  Infant 3 mls of formula.  Mom informed LC she wanted to breastfeed but felt she didn't have any colostrum or that it wasn't enough for infant. LC ask permission to touch mom's breast, mom easily expressed 3 mls that was given to infant by spoon. LC discussed infant's small tummy size the first few days of life and that colostrum is enough but if mom wants to supplement with formula, LC advised mom to BF infant at every feeding 1st to help establish her milk supply and after BF infant then offer formula. Mom was agreeable with this plan. LC assisted mom with pre-pumping her breast with hand pump, then apply 20 mm NS, infant latched on mom's left breast using the football hold position and sustained latch for 10 minutes. Mom was burping infant and doing STS as LC left the room. Mom felt all questions and concerns were answered by [facility] at this time. Mom's current plan: 1- Mom pre-pump breast with hand pump, apply NS 20 and BF infant according to hunger cues, 8 to 12+ times within 24 hours,STS. 2- Afterwards dad will supplement infant with formula (Mom's choice) 5 to 10 mls per feeding day one. 3- While dad is offering supplement mom will use DEBP every 3 hours for 15 minutes on initial setting. 4- Mom will offer infant any EBM first after latching infant at the breast first before supplementing with formua. 5- Mom knows to call RN or LC for assistance with latch if needed.  Maternal Data    Feeding Mother's Current Feeding Choice: Breast Milk and Formula Nipple Type: Slow - flow  LATCH Score Latch: Grasps breast  easily, tongue down, lips flanged, rhythmical sucking.  Audible Swallowing: Spontaneous and intermittent  Type of Nipple: Flat  Comfort (Breast/Nipple): Soft / non-tender  Hold (Positioning): Assistance needed to correctly position infant at breast and maintain latch.  LATCH Score: 8   Lactation Tools Discussed/Used Breast pump type: Double-Electric Breast Pump;Manual Pump Education: Setup, frequency, and cleaning;Milk Storage Reason for Pumping: Help stimulate and establish milk supply Pumping frequency: Mom will start pumping every 3 hours for 15 minutes on inital setting  Interventions Interventions: Assisted with latch;Skin to skin;Hand express;Adjust position;Support pillows;Breast compression;Expressed milk;DEBP;Hand pump;Education  Discharge WIC Program: No  Consult Status Consult Status: Follow-up Date: 08/09/20 Follow-up type: In-patient    Danelle Earthly 08/08/2020, 4:44 PM

## 2020-08-08 NOTE — Progress Notes (Addendum)
PPD #1 Feels ok, left leg still a bit numb but getting better Afeb, VSS Fundus firm, NT at U-1 Continue routine postpartum care, asymptomatic COVID pos

## 2020-08-08 NOTE — Anesthesia Postprocedure Evaluation (Signed)
Anesthesia Post Note  Patient: Mariah [name]  Procedure(s) Performed: AN AD HOC LABOR EPIDURAL     Patient location during evaluation: Mother Baby Anesthesia Type: Epidural Level of consciousness: awake and alert Pain management: pain level controlled Vital Signs Assessment: post-procedure vital signs reviewed and stable Respiratory status: spontaneous breathing, nonlabored ventilation and respiratory function stable Cardiovascular status: stable Postop Assessment: no headache, no backache and epidural receding Anesthetic complications: no Comments: Still having some left leg weakness but able to weight bare and get up to the restroom.   No complications documented.  Last Vitals:  Vitals:   08/08/20 0139 08/08/20 0503  BP: 124/65 129/75  Pulse: 94 95  Resp: 18 18  Temp: 36.7 C 36.5 C  SpO2: 99% 99%    Last Pain:  Vitals:   08/08/20 0503  TempSrc: Oral  PainSc: 2    Pain Goal:                   Junious Silk

## 2020-08-08 NOTE — Lactation Note (Signed)
This note was copied from a baby's chart. Lactation Consultation Note  Patient Name: Mariah [name] [id number] Date: 08/08/2020 Reason for consult: Initial assessment;Primapara;1st time breastfeeding;Early term 37-38.6wks Age:34 hours Mom sitting in bed, dad resting on couch, baby asleep in bassinet. Mom reports plans to breastfeed but is unsure if baby is getting enough milk, states baby pops on and off of breast during feedings, lasts about . States baby achieved a great latch after delivery but has not done so since. Mom denies noting milk when attempting to use hand pump or hand expression. Reports with the last feeding attempted to latch baby to breast but was unsure if baby received enough milk therefore offered 74ml formula. Mom reports having a DEBP at home.  Reviewed hand expression, mom expressed 2drops from left breast. DEBP set up, reviewed frequency and cleanup. Mom expressed ~70ml which LC fed back to baby with gloved finger. LC changed moderate stool meconium diaper. Baby up to right breast cross cradle and football hold, mom with flat nipples bilat, baby unable to maintain latch or open bottom jaw for wide latch. Mom fitted with 44mm nipple shield, baby latched without difficulty, swallows palpated, baby released breast on own after ~25mins evidence of colostrum pooling within nipple shield at the base of nipple.   Discussed cue based feedings, wake if >3hrs since last feeding, expect 8-12 feedings every 24hrs, use DEBP after each feeding, offer EBM back to baby, expected length of feedings, signs of adequate milk transfer, nipple shield for each feeding. Advised nipple shield is temporary, will re-evaluate latch at next visit. Mom aware to call for [facility] support if needed prior to next visit. RN in the room to assist mom with ambulating to the bathroom, updated RN on plan. BGilliam, RN, IBCLC  Maternal Data Has patient been taught Hand Expression?: Yes Does the patient have  breastfeeding experience prior to this delivery?: No  Feeding Mother's Current Feeding Choice: Breast Milk and Formula Nipple Type: Slow - flow  LATCH Score Latch: Grasps breast easily, tongue down, lips flanged, rhythmical sucking.  Audible Swallowing: A few with stimulation  Type of Nipple: Flat  Comfort (Breast/Nipple): Soft / non-tender  Hold (Positioning): No assistance needed to correctly position infant at breast.  LATCH Score: 8   Lactation Tools Discussed/Used Tools: Nipple Shields Nipple shield size: 20 Breast pump type: Double-Electric Breast Pump Pump Education: Setup, frequency, and cleaning Reason for Pumping: stimulation  Interventions Interventions: Breast feeding basics reviewed;Assisted with latch;Skin to skin;Pre-pump if needed;Hand express;Adjust position;Support pillows;Position options;Expressed milk;DEBP;Hand pump  Discharge    Consult Status Consult Status: Follow-up Date: 08/09/20 Follow-up type: In-patient    [name] 08/08/2020, 12:03 PM

## 2020-08-08 NOTE — Social Work (Signed)
Mariah [name] was referred for history of depression and anxiety.   * Referral screened out by Clinical Social Worker because none of the following criteria appear to apply:  ~ History of anxiety/depression during this pregnancy, or of post-partum depression following prior delivery. ~ Diagnosis of anxiety and/or depression within last 3 years. OR * Mariah [name]'s symptoms currently being treated with medication and/or therapy. Mariah [name] active Lexapro rx, prior to weaning off during pregnancy.  Please contact the Clinical Social Worker if needs arise, by [facility] request, or if Mariah [name] scores greater than 9/yes to question 10 on Edinburgh Postpartum Depression Screen.  Manfred Arch, LCSWA Clinical Social Work [employer] and [employer]  [phone]

## 2020-08-08 NOTE — Lactation Note (Signed)
This note was copied from a baby's chart. Lactation Consultation Note Baby 3 hrs old. Didn't want to see LC in L&D nor tonight. Would like to see Lactation during the day. Is doing breast and formula.  Patient Name: Girl Luvada Salamone [id number] Date: 08/08/2020   Age:34 hours  Maternal Data    Feeding    LATCH Score                    Lactation Tools Discussed/Used    Interventions    Discharge    Consult Status      Charyl Dancer 08/08/2020, 1:44 AM

## 2020-08-09 MED ORDER — IBUPROFEN 600 MG PO TABS: 600.0000 mg | ORAL_TABLET | Freq: Four times a day (QID) | ORAL | 0 refills | Status: DC

## 2020-08-09 NOTE — Lactation Note (Signed)
This note was copied from a baby's chart. Lactation Consultation Note  Patient Name: Mariah [name] [id number]'V Date: 08/09/2020 Reason for consult: Follow-up assessment Age:34 hours   P1, Mother attempting to latch infant with #20 NS  When LC arrived in the room. Mother has flat nipples. Assist mother with proper positioning.  Mother taught to apply NS . #20 fit well. Taught parents to properly flange infants lips for wide gape. Parents receptive to all teaching. Infant sustained latch for 25 mins. Observed milk in the shield and on mothers breast.  MD in to see infant.   Assist mother with latching infant on the alternate breast. Observed milk in the shield . Infant sustained latch for 10  Mins.  Father going to feed infant formula and mother to pump breast for 15 mins.  Suggested that mother follow up with [facility] for feeding assessment.   Reviewed treatment and prevention of engorgement.  Reviewed milk storage guidelines and supplemental guidelines.  Suggested that infant be offered as much volume as she wants. Mother to breastfeed, given ebm then formula . Mother has a [place] DEBP  at home.  Plan of Care : Breastfeed infant with feeding cues Supplement infant with ebm/formula, according to supplemental guidelines and as much as infant wants. Pump using a DEBP after each feeding for 15-20 mins.   Mother to continue to cue base feed infant and feed at least 8-12 times or more in 24 hours and advised to allow for cluster feeding infant as needed.  Mother to continue to due STS. Mother is aware of available LC services at [facility], BFSG'S, OP Dept, and phone # for questions or concerns about breastfeeding. Message was sent to OP [facility] for consult.  Mother receptive to all teaching and plan of care.   Maternal Data    Feeding Mother's Current Feeding Choice: Breast Milk Nipple Type: Slow - flow  LATCH Score Latch: Grasps breast easily, tongue down, lips flanged, rhythmical  sucking.  Audible Swallowing: A few with stimulation  Type of Nipple: Flat  Comfort (Breast/Nipple): Soft / non-tender  Hold (Positioning): Assistance needed to correctly position infant at breast and maintain latch.  LATCH Score: 7   Lactation Tools Discussed/Used Tools: Nipple Shields Nipple shield size: 20  Interventions Interventions: Breast feeding basics reviewed;Assisted with latch;Skin to skin;Hand express;Pre-pump if needed;Breast compression;Adjust position;Support pillows;Position options;Hand pump;DEBP;Education  Discharge Discharge Education: Engorgement and breast care;Warning signs for feeding baby;Outpatient recommendation;Outpatient Epic message sent Pump: DEBP  Consult Status Consult Status: Complete    Michel Bickers 08/09/2020, 9:28 AM

## 2020-08-09 NOTE — Discharge Summary (Signed)
Postpartum Discharge Summary       Patient Name: Mariah [name] DOB: 05-28-1986 MRN: [id number]  Date of admission: 08/07/2020 Delivery date:08/07/2020  Delivering provider: Pryor Ochoa [facility]  Date of discharge: 08/09/2020  Admitting diagnosis: Normal labor [O80, Z37.9] Intrauterine pregnancy: [redacted]w[redacted]d     Secondary diagnosis:  Active Problems:   Normal labor  Additional problems: COVID + 08/07/20, mild symptoms    Discharge diagnosis: Term Pregnancy Delivered                                              Post partum procedures:none Augmentation: AROM and Pitocin Complications: None  Hospital course: Onset of Labor With Vaginal Delivery      35 y.o. yo G1P1001 at [redacted]w[redacted]d was admitted in Latent Labor on 08/07/2020. Patient had an uncomplicated labor course as follows:  Membrane Rupture Time/Date: 9:57 AM ,08/07/2020   Delivery Method:Vaginal, Spontaneous  Episiotomy: None  Lacerations:  2nd degree;Perineal  Patient had an uncomplicated postpartum course.  She is ambulating, tolerating a regular diet, passing flatus, and urinating well. Patient is discharged home in stable condition on 08/09/20.  Newborn Data: Birth date:08/07/2020  Birth time:10:35 PM  Gender:Female  Living status:Living  Apgars:8 ,9  Weight:3371 g   Magnesium Sulfate received: No BMZ received: No Rhophylac:No   Physical exam  Vitals:   08/08/20 0900 08/08/20 1230 08/08/20 2050 08/09/20 0600  BP: 134/77 128/63 132/74 (!) 110/59  Pulse: 80 78 89 77  Resp: 18 18 16 16   Temp: 98 F (36.7 C) 98 F (36.7 C) 98.4 F (36.9 C) 97.8 F (36.6 C)  TempSrc: Oral Oral Oral   SpO2: 100% 100% 99% 99%  Weight:      Height:       General: alert and cooperative Lochia: appropriate Uterine Fundus: firm  Labs: Lab Results  Component Value Date   WBC 22.0 (H) 08/08/2020   HGB 10.7 (L) 08/08/2020   HCT 32.5 (L) 08/08/2020   MCV 82.9 08/08/2020   PLT 191 08/08/2020   CMP Latest Ref Rng & Units 12/30/2018   Glucose 70 - 99 mg/dL 84  BUN 6 - 23 mg/dL 13  Creatinine 03/02/2019 - 4.01 mg/dL 0.27  Sodium 2.53 - 664 mEq/L 137  Potassium 3.5 - 5.1 mEq/L 4.4  Chloride 96 - 112 mEq/L 102  CO2 19 - 32 mEq/L 25  Calcium 8.4 - 10.5 mg/dL 8.8  Total Protein 6.0 - 8.3 g/dL 6.5  Total Bilirubin 0.2 - 1.2 mg/dL 0.5  Alkaline Phos 39 - 117 U/L 65  AST 0 - 37 U/L 12  ALT 0 - 35 U/L 9   Edinburgh Score: Edinburgh Postnatal Depression Scale Screening Tool 08/08/2020  I have been able to laugh and see the funny side of things. 0  I have looked forward with enjoyment to things. 0  I have blamed myself unnecessarily when things went wrong. 2  I have been anxious or worried for no good reason. 0  I have felt scared or panicky for no good reason. 0  Things have been getting on top of me. 1  I have been so unhappy that I have had difficulty sleeping. 0  I have felt sad or miserable. 0  I have been so unhappy that I have been crying. 1  The thought of harming myself has occurred to me. 0  Edinburgh Postnatal Depression Scale Total 4     After visit meds:  Allergies as of 08/09/2020      Reactions   Codeine Nausea Only, Other (See Comments)   Passes out   Prednisone Nausea Only, Other (See Comments)   DIZZINESS.    Vicodin [hydrocodone-acetaminophen] Nausea Only, Other (See Comments)   Passes out      Medication List    STOP taking these medications   amoxicillin 500 MG capsule Commonly known as: AMOXIL   doxylamine (Sleep) 25 MG tablet Commonly known as: UNISOM   famotidine 10 MG tablet Commonly known as: PEPCID   pantoprazole 40 MG tablet Commonly known as: PROTONIX     TAKE these medications   acetaminophen 500 MG tablet Commonly known as: TYLENOL Take 500 mg by mouth every 6 (six) hours as needed for mild pain or headache.   albuterol 108 (90 Base) MCG/ACT inhaler Commonly known as: Ventolin HFA Inhale 2 puffs into the lungs every 4 (four) hours as needed for wheezing or shortness of  breath.   cetirizine 10 MG tablet Commonly known as: ZYRTEC Take 10 mg by mouth daily as needed for allergies.   fluticasone 50 MCG/ACT nasal spray Commonly known as: FLONASE Place 2 sprays into both nostrils daily. Please dispense generic.   ibuprofen 600 MG tablet Commonly known as: ADVIL Take 1 tablet (600 mg total) by mouth every 6 (six) hours.   prenatal multivitamin Tabs tablet Take 1 tablet by mouth daily at 12 noon.        Discharge home in stable condition Infant Feeding: Breast Infant Disposition:home with mother Discharge instruction: per After Visit Summary and Postpartum booklet. Activity: Advance as tolerated. Pelvic rest for 6 weeks.  Diet: routine diet Future Appointments:No future appointments. Follow up Visit:  Follow-up Information    Edwinna Areola, DO. Schedule an appointment as soon as possible for a visit in 6 week(s).   Specialty: Obstetrics and Gynecology Why: postpartum Contact information: [address] [place] [address] [place] [address] [phone]                Please schedule this patient for a In person postpartum visit in 6 weeks with the following provider: MD.  Delivery mode:  Vaginal, Spontaneous  Anticipated Birth Control:  POPs   08/09/2020 Oliver Pila, MD

## 2020-08-09 NOTE — Progress Notes (Signed)
Post Partum Day 2 Subjective: no complaints, up ad lib and tolerating PO  Feels breastfeeding going well.  Objective: Blood pressure (!) 110/59, pulse 77, temperature 97.8 F (36.6 C), resp. rate 16, height 5\' 2"  (1.575 m), weight 82.5 kg, last menstrual period 09/13/2019, SpO2 99 %, unknown if currently breastfeeding.  Physical Exam:  General: alert and cooperative Lochia: appropriate Uterine Fundus: firm  Recent Labs    08/07/20 0346 08/08/20 0440  HGB 12.7 10.7*  HCT 39.1 32.5*    Assessment/Plan: Discharge home  Routine pp visit 6 weeks   LOS: 2 days   08/10/20 08/09/2020, 8:47 AM

## 2021-02-13 ENCOUNTER — Ambulatory Visit: Payer: Self-pay | Admitting: Allergy & Immunology

## 2021-02-27 ENCOUNTER — Ambulatory Visit: Payer: 59 | Admitting: Allergy & Immunology

## 2021-02-27 ENCOUNTER — Encounter: Payer: Self-pay | Admitting: Allergy & Immunology

## 2021-02-27 ENCOUNTER — Other Ambulatory Visit: Payer: Self-pay

## 2021-02-27 VITALS — BP 122/76 | HR 86 | Temp 98.2°F | Resp 16 | Ht 62.0 in | Wt 146.5 lb

## 2021-02-27 DIAGNOSIS — J3089 Other allergic rhinitis: Secondary | ICD-10-CM | POA: Diagnosis not present

## 2021-02-27 DIAGNOSIS — J4520 Mild intermittent asthma, uncomplicated: Secondary | ICD-10-CM

## 2021-02-27 MED ORDER — FLUTICASONE PROPIONATE HFA 110 MCG/ACT IN AERO: 1.0000 | INHALATION_SPRAY | Freq: Two times a day (BID) | RESPIRATORY_TRACT | 11 refills | Status: DC

## 2021-02-27 MED ORDER — ALBUTEROL SULFATE HFA 108 (90 BASE) MCG/ACT IN AERS
2.0000 | INHALATION_SPRAY | RESPIRATORY_TRACT | 2 refills | Status: DC | PRN
Start: 2021-02-27 — End: 2021-07-24

## 2021-02-27 MED ORDER — FLUTICASONE PROPIONATE 50 MCG/ACT NA SUSP: 2.0000 | Freq: Every day | NASAL | 5 refills | Status: DC

## 2021-02-27 NOTE — Progress Notes (Signed)
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 Mild intermittent asthma, uncomplicated - Lung testing looked amazing today. - We will refill your albuterol inhaler. - I would like you to start Flovent 110 mcg 1 puff twice daily during the winter season. - Hopefully this can prevent any asthma attacks and control your breathing better during the winter. - Call us with any problems.  2. Allergic rhinitis - Continue with Allegra one tablet once daily as needed. - Continue with Flonase one spray per nostril daily as needed.   3. Return in about 1 year (around 02/27/2022).   Subjective:   Mariah [name] is a 35 y.o. female presenting today for follow up of  Chief Complaint  Patient presents with   Follow-up    [name] has a history of the following: Patient Active Problem List   Diagnosis Date Noted   Normal labor 08/07/2020   Pruritus of vagina 07/01/2019   URI (upper respiratory infection) 01/28/2017   Missed period 01/28/2017   Encounter to establish care 05/04/2015   Motion sickness 05/04/2015   Chronic urticaria 02/25/2015   Mild intermittent asthma 02/25/2015   Rash and nonspecific skin eruption 09/21/2012   Left knee pain 08/02/2011   Contraception management 05/13/2011   PAP SMEAR, ABNORMAL 04/27/2009   SYNCOPE 02/08/2009   ALLERGIC RHINITIS 12/21/2008   Asthma, chronic 08/18/2007   Generalized anxiety disorder 03/11/2007   PANIC ATTACK 03/11/2007    History obtained from: chart review and patient.  [name] is a 35 y.o. female presenting for a follow up visit.  She was last seen in January 2021.  At that time, we deferred lung testing.  We refilled her  albuterol.  For her rhinitis, we continue with Allegra and Flonase.  Since last visit, she has mostly done well.  She has not been back to see [place] because she was pregnant and gave birth to a healthy baby girl around 6 months ago.  She is clearly submitted and shows me some pictures of her.  Asthma/Respiratory Symptom History: She was previously on albuterol, but she reports to me that since the pregnancy and since she had COVID-19 at 8 months gestation, her asthma became worse.  She tells me she was reaching for her albuterol once a month, but since having COVID she has been using it 2-3 times a week.  She has not needed prednisone and has not been to the emergency room.  Her sleep has not been interrupted by the coughing, but she is concerned that she is using albuterol too often.  She has never been on a controller medication.  Allergic Rhinitis Symptom History: Her rhinitis is under fairly good control.  She remains on the antihistamine and no spray as needed.  She has not needed antibiotics for any sinus infections.  She continues to work at [employer] on [place].  She is able to stay home on Tuesdays and Thursdays and her in-laws watch her daughter on Mondays, Wednesdays, and Fridays.  She did watch the new [place] Coach film.  She says you can watch it on HBO if you sign up for this 7 free days.  He can watch it and then cancel  your subscription.  Otherwise, there have been no changes to her past medical history, surgical history, family history, or social history.    Review of Systems  Constitutional: Negative.  Negative for chills, fever, malaise/fatigue and weight loss.  HENT: Negative.  Negative for congestion, ear discharge and ear pain.   Eyes:  Negative for pain, discharge and redness.  Respiratory:  Positive for cough. Negative for sputum production, shortness of breath and wheezing.   Cardiovascular: Negative.  Negative for chest pain and palpitations.  Gastrointestinal:   Negative for abdominal pain and heartburn.  Skin: Negative.  Negative for itching and rash.  Neurological:  Negative for dizziness and headaches.  Endo/Heme/Allergies:  Negative for environmental allergies. Does not bruise/bleed easily.      Objective:   Blood pressure 122/76, pulse 86, temperature 98.2 F (36.8 C), temperature source Temporal, resp. rate 16, height 5\' 2"  (1.575 m), weight 146 lb 8 oz (66.5 kg), SpO2 98 %, unknown if currently breastfeeding. Body mass index is 26.8 kg/m.   Physical Exam:  Physical Exam Vitals reviewed.  Constitutional:      Appearance: She is well-developed.     Comments: Talkative.  HENT:     Head: Normocephalic and atraumatic.     Right Ear: Tympanic membrane, ear canal and external ear normal.     Left Ear: Tympanic membrane, ear canal and external ear normal.     Nose: No nasal deformity, septal deviation, mucosal edema or rhinorrhea.     Right Turbinates: Enlarged and swollen.     Left Turbinates: Enlarged and swollen.     Right Sinus: No maxillary sinus tenderness or frontal sinus tenderness.     Left Sinus: No maxillary sinus tenderness or frontal sinus tenderness.     Mouth/Throat:     Mouth: Mucous membranes are not pale and not dry.     Pharynx: Uvula midline.  Eyes:     General: Lids are normal. No allergic shiner.       Right eye: No discharge.        Left eye: No discharge.     Conjunctiva/sclera: Conjunctivae normal.     Right eye: Right conjunctiva is not injected. No chemosis.    Left eye: Left conjunctiva is not injected. No chemosis.    Pupils: Pupils are equal, round, and reactive to light.  Cardiovascular:     Rate and Rhythm: Normal rate and regular rhythm.     Heart sounds: Normal heart sounds.  Pulmonary:     Effort: Pulmonary effort is normal. No tachypnea, accessory muscle usage or respiratory distress.     Breath sounds: Normal breath sounds. No wheezing, rhonchi or rales.     Comments: Moving air well in all  lung fields.  No increased work of breathing. Chest:     Chest wall: No tenderness.  Lymphadenopathy:     Cervical: No cervical adenopathy.  Skin:    General: Skin is warm.     Capillary Refill: Capillary refill takes less than 2 seconds.     Coloration: Skin is not pale.     Findings: No abrasion, erythema, petechiae or rash. Rash is not papular, urticarial or vesicular.     Comments: No eczematous or urticarial lesions noted.  Neurological:     Mental Status: She is alert.  Psychiatric:        Behavior: Behavior is cooperative.      Diagnostic studies:     Spirometry: results normal (FEV1: 2.59/89%, FVC: 2.98/85%, FEV1/FVC: 87%).  Spirometry consistent with normal pattern.   Allergy Studies: none        Salvatore Marvel, MD  [facility] [place] [facility] [place]

## 2021-02-27 NOTE — Patient Instructions (Addendum)
1. Mild intermittent asthma, uncomplicated - Lung testing looked amazing today. - We will refill your albuterol inhaler. - I would like you to start Flovent 110 mcg 1 puff twice daily during the winter season. - Hopefully this can prevent any asthma attacks and control your breathing better during the winter. - Call us with any problems.  2. Allergic rhinitis - Continue with Allegra one tablet once daily as needed. - Continue with Flonase one spray per nostril daily as needed.   3. Return in about 1 year (around 02/27/2022).    Please inform us of any Emergency Department visits, hospitalizations, or changes in symptoms. Call us before going to the ED for breathing or allergy symptoms since we might be able to fit you in for a sick visit. Feel free to contact us anytime with any questions, problems, or concerns.  It was a pleasure to see you again today! Thanks for the HBO tip!   Websites that have reliable patient information: 1. American Academy of Asthma, Allergy, and Immunology: www.aaaai.org 2. Food Allergy Research and Education (FARE): foodallergy.org 3. Mothers of Asthmatics: http://www.asthmacommunitynetwork.org 4. American College of Allergy, Asthma, and Immunology: www.acaai.org  "Like" [place] on Facebook and Instagram for our latest updates!       Make sure you are registered to vote! If you have moved or changed any of your contact information, you will need to get this updated before voting!  In some cases, you MAY be able to register to vote online: [email or web address]

## 2021-06-18 ENCOUNTER — Other Ambulatory Visit: Payer: Self-pay

## 2021-06-18 ENCOUNTER — Ambulatory Visit
Admission: EM | Admit: 2021-06-18 | Discharge: 2021-06-18 | Disposition: A | Discharge status: Home / Self Care | Payer: 59 | Attending: Family Medicine | Admitting: Family Medicine

## 2021-06-18 DIAGNOSIS — J3089 Other allergic rhinitis: Secondary | ICD-10-CM

## 2021-06-18 DIAGNOSIS — J4521 Mild intermittent asthma with (acute) exacerbation: Secondary | ICD-10-CM | POA: Diagnosis not present

## 2021-06-18 DIAGNOSIS — J069 Acute upper respiratory infection, unspecified: Secondary | ICD-10-CM

## 2021-06-18 MED ORDER — DEXAMETHASONE SODIUM PHOSPHATE 10 MG/ML IJ SOLN
10.0000 mg | Freq: Once | INTRAMUSCULAR | Status: AC
  Administered 2021-06-18: 12:00:00 10 mg via INTRAMUSCULAR

## 2021-06-18 MED ORDER — FLUTICASONE PROPIONATE HFA 110 MCG/ACT IN AERO: 2.0000 | INHALATION_SPRAY | Freq: Two times a day (BID) | RESPIRATORY_TRACT | 0 refills | Status: DC

## 2021-06-18 MED ORDER — ALBUTEROL SULFATE HFA 108 (90 BASE) MCG/ACT IN AERS: 1.0000 | INHALATION_SPRAY | Freq: Four times a day (QID) | RESPIRATORY_TRACT | 0 refills | Status: DC | PRN

## 2021-06-18 MED ORDER — PROMETHAZINE-DM 6.25-15 MG/5ML PO SYRP: 5.0000 mL | ORAL_SOLUTION | Freq: Four times a day (QID) | ORAL | 0 refills | Status: DC | PRN

## 2021-06-18 NOTE — ED Triage Notes (Signed)
Patient states she has had thick mucus and congestion for 9 days. Patient states she has asthma and is over using her albuterol.   She states she had an old Augmentin pills for 4 days, Monday thru Thursday and no relief.    Patient state shse also took 2 boxes of Mucinex to control the cough. She states at night she strains her throat while coughing.  Denies Fever  Patient does not want to tested for Covid/Flu

## 2021-06-18 NOTE — ED Provider Notes (Addendum)
[facility]    CSN: [id number] Arrival date & time: 06/18/21  0855      History   Chief Complaint Chief Complaint  Patient presents with   Cough    Congestion and cough    HPI Mariah [name] is a 35 y.o. female.   Presenting today with a week and a half of cough, chest tightness, wheezing.  States it worsens at night, especially with the heater on.  She has taken several packs of Mucinex, some old Augmentin for 4 days that she had at home and is overusing her albuterol inhaler for her asthma with minimal relief.  Denies fever, chills, sore throat, nasal congestion, abdominal pain, nausea vomiting or diarrhea.  History of seasonal allergies, asthma.  No known sick contacts recently.  Past Medical History:  Diagnosis Date   Allergy    Anxiety state, unspecified    Asthma    Panic disorder without agoraphobia    Urticaria    Patient Active Problem List   Diagnosis Date Noted   Normal labor 08/07/2020   Pruritus of vagina 07/01/2019   URI (upper respiratory infection) 01/28/2017   Missed period 01/28/2017   Encounter to establish care 05/04/2015   Motion sickness 05/04/2015   Chronic urticaria 02/25/2015   Mild intermittent asthma 02/25/2015   Rash and nonspecific skin eruption 09/21/2012   Left knee pain 08/02/2011   Contraception management 05/13/2011   PAP SMEAR, ABNORMAL 04/27/2009   SYNCOPE 02/08/2009   ALLERGIC RHINITIS 12/21/2008   Asthma, chronic 08/18/2007   Generalized anxiety disorder 03/11/2007   PANIC ATTACK 03/11/2007   Past Surgical History:  Procedure Laterality Date   TONSILLECTOMY     01/26/07- Dr. Redmond Baseman   WISDOM TOOTH EXTRACTION     OB History     Gravida  1   Para  1   Term  1   Preterm  0   AB  0   Living  1      SAB  0   IAB  0   Ectopic  0   Multiple  0   Live Births  1          Home Medications    Prior to Admission medications   Medication Sig Start Date End Date Taking? Authorizing Provider   albuterol (VENTOLIN HFA) 108 (90 Base) MCG/ACT inhaler Inhale 1-2 puffs into the lungs every 6 (six) hours as needed for wheezing or shortness of breath. 06/18/21  Yes Volney American, PA-C  fluticasone (FLOVENT HFA) 110 MCG/ACT inhaler Inhale 2 puffs into the lungs 2 (two) times daily. 06/18/21  Yes Volney American, PA-C  promethazine-dextromethorphan (PROMETHAZINE-DM) 6.25-15 MG/5ML syrup Take 5 mLs by mouth 4 (four) times daily as needed. 06/18/21  Yes Volney American, PA-C  acetaminophen (TYLENOL) 500 MG tablet Take 500 mg by mouth every 6 (six) hours as needed for mild pain or headache.    [provider]  albuterol (VENTOLIN HFA) 108 (90 Base) MCG/ACT inhaler Inhale 2 puffs into the lungs every 4 (four) hours as needed for wheezing or shortness of breath. 02/27/21   Valentina Shaggy, MD  cetirizine (ZYRTEC) 10 MG tablet Take 10 mg by mouth daily as needed for allergies.    [provider]  fluticasone (FLONASE) 50 MCG/ACT nasal spray Place 2 sprays into both nostrils daily. Please dispense generic. 02/27/21   Valentina Shaggy, MD  fluticasone (FLOVENT HFA) 110 MCG/ACT inhaler Inhale 1 puff into the lungs 2 (two)  times daily. During the winter season 02/27/21   Alfonse Spruce, MD  ibuprofen (ADVIL) 600 MG tablet Take 1 tablet (600 mg total) by mouth every 6 (six) hours. 08/09/20   Huel Cote, MD  Prenatal Vit-Fe Fumarate-FA (PRENATAL MULTIVITAMIN) TABS tablet Take 1 tablet by mouth daily at 12 noon.    [provider]  fexofenadine (ALLEGRA) 180 MG tablet Take 180 mg by mouth daily. Reported on 08/18/2015  09/30/19  [provider]   Family History Family History  Problem Relation Age of Onset   Hypertension Father    Drug abuse Father    Arthritis Father    Crohn's disease Father    Depression Mother    Diabetes Other    Cancer Other    Stroke Other    Congestive Heart Failure Paternal Grandmother    Social  History Social History   Tobacco Use   Smoking status: Never   Smokeless tobacco: Never  Vaping Use   Vaping Use: Never used  Substance Use Topics   Alcohol use: Yes    Alcohol/week: 0.0 standard drinks    Comment: very rarely   Drug use: No    Allergies   Codeine, Prednisone, and Vicodin [hydrocodone-acetaminophen]   Review of Systems Review of Systems PER HPI  Physical Exam Triage Vital Signs ED Triage Vitals  Enc Vitals Group     BP 06/18/21 1112 123/76     Pulse Rate 06/18/21 1112 100     Resp 06/18/21 1112 16     Temp 06/18/21 1112 98.2 F (36.8 C)     Temp Source 06/18/21 1112 Oral     SpO2 06/18/21 1112 95 %     Weight --      Height --      Head Circumference --      Peak Flow --      Pain Score 06/18/21 1108 5     Pain Loc --      Pain Edu? --      Excl. in GC? --    No data found.  Updated Vital Signs BP 123/76 (BP Location: Right Arm)    Pulse 100    Temp 98.2 F (36.8 C) (Oral)    Resp 16    SpO2 95%    Breastfeeding No   Visual Acuity Right Eye Distance:   Left Eye Distance:   Bilateral Distance:    Right Eye Near:   Left Eye Near:    Bilateral Near:     Physical Exam Vitals and nursing note reviewed.  Constitutional:      Appearance: Normal appearance.  HENT:     Head: Atraumatic.     Right Ear: Tympanic membrane and external ear normal.     Left Ear: Tympanic membrane and external ear normal.     Nose: Nose normal.     Mouth/Throat:     Mouth: Mucous membranes are moist.     Pharynx: Posterior oropharyngeal erythema present. No oropharyngeal exudate.  Eyes:     Extraocular Movements: Extraocular movements intact.     Conjunctiva/sclera: Conjunctivae normal.  Cardiovascular:     Rate and Rhythm: Normal rate and regular rhythm.     Heart sounds: Normal heart sounds.  Pulmonary:     Effort: Pulmonary effort is normal.     Breath sounds: Wheezing present. No rales.     Comments: Mild scattered wheezes bilaterally Abdominal:      General: Bowel sounds are normal. There is no distension.  Palpations: Abdomen is soft.     Tenderness: There is no abdominal tenderness. There is no guarding.  Musculoskeletal:        General: Normal range of motion.     Cervical back: Normal range of motion and neck supple.  Skin:    General: Skin is warm and dry.  Neurological:     Mental Status: She is alert and oriented to person, place, and time.  Psychiatric:        Mood and Affect: Mood normal.        Thought Content: Thought content normal.   UC Treatments / Results  Labs (all labs ordered are listed, but only abnormal results are displayed) Labs Reviewed - No data to display  EKG  Radiology No results found.  Procedures Procedures (including critical care time)  Medications Ordered in UC Medications  dexamethasone (DECADRON) injection 10 mg (10 mg Intramuscular Given 06/18/21 1138)   Initial Impression / Assessment and Plan / UC Course  I have reviewed the triage vital signs and the nursing notes.  Pertinent labs & imaging results that were available during my care of the patient were reviewed by me and considered in my medical decision making (see chart for details).     Vital signs reassuring today, suspect asthma exacerbation secondary to uncontrolled seasonal allergies or viral upper respiratory infection last week.  We will treat with IM Decadron, refill albuterol inhaler given frequent recent use and add Flovent, Phenergan DM for symptomatic benefit.  Discussed supportive over-the-counter medications and home care.  Return for acutely worsening symptoms.  Final Clinical Impressions(s) / UC Diagnoses   Final diagnoses:  Mild intermittent asthma with acute exacerbation  Seasonal allergic rhinitis due to other allergic trigger  Viral URI   Discharge Instructions   None    ED Prescriptions     Medication Sig Dispense Auth. Provider   albuterol (VENTOLIN HFA) 108 (90 Base) MCG/ACT inhaler Inhale  1-2 puffs into the lungs every 6 (six) hours as needed for wheezing or shortness of breath. 18 g Volney American, PA-C   fluticasone [facility]) 110 MCG/ACT inhaler Inhale 2 puffs into the lungs 2 (two) times daily. 1 each Volney American, PA-C   promethazine-dextromethorphan (PROMETHAZINE-DM) 6.25-15 MG/5ML syrup Take 5 mLs by mouth 4 (four) times daily as needed. 100 mL Volney American, [place]      PDMP not reviewed this encounter.   Volney American, [place] 06/18/21 [place], [place], [place] 06/18/21 1148

## 2021-07-16 ENCOUNTER — Encounter (HOSPITAL_COMMUNITY): Payer: Self-pay

## 2021-07-16 ENCOUNTER — Emergency Department (HOSPITAL_COMMUNITY)
Admission: EM | Admit: 2021-07-16 | Discharge: 2021-07-17 | Disposition: A | Discharge status: Home / Self Care | Payer: 59 | Attending: Emergency Medicine | Admitting: Emergency Medicine

## 2021-07-16 ENCOUNTER — Other Ambulatory Visit: Payer: Self-pay

## 2021-07-16 DIAGNOSIS — Z20822 Contact with and (suspected) exposure to covid-19: Secondary | ICD-10-CM | POA: Diagnosis not present

## 2021-07-16 DIAGNOSIS — J45909 Unspecified asthma, uncomplicated: Secondary | ICD-10-CM | POA: Insufficient documentation

## 2021-07-16 DIAGNOSIS — Z8616 Personal history of COVID-19: Secondary | ICD-10-CM | POA: Diagnosis not present

## 2021-07-16 DIAGNOSIS — Z7951 Long term (current) use of inhaled steroids: Secondary | ICD-10-CM | POA: Diagnosis not present

## 2021-07-16 DIAGNOSIS — J209 Acute bronchitis, unspecified: Secondary | ICD-10-CM | POA: Insufficient documentation

## 2021-07-16 DIAGNOSIS — R059 Cough, unspecified: Secondary | ICD-10-CM | POA: Diagnosis present

## 2021-07-16 NOTE — ED Triage Notes (Signed)
Pt to ED by POV from home with c/o productive cough which began on Friday, states her daughter was recently diagnosed with RSV. Pt states it is now painful to breath. Arrives A+O, VSS, NADN. Lung sounds clear bilaterally in triage, RA sat is 100%.

## 2021-07-17 ENCOUNTER — Emergency Department (HOSPITAL_COMMUNITY): Payer: 59

## 2021-07-17 ENCOUNTER — Ambulatory Visit: Payer: 59 | Admitting: Allergy & Immunology

## 2021-07-17 LAB — RESP PANEL BY RT-PCR (FLU A&B, COVID) ARPGX2
Influenza A by PCR: NEGATIVE
Influenza B by PCR: NEGATIVE
SARS Coronavirus 2 by RT PCR: NEGATIVE

## 2021-07-17 MED ORDER — DEXAMETHASONE 6 MG PO TABS: 12.0000 mg | ORAL_TABLET | Freq: Every day | ORAL | 0 refills | Status: DC

## 2021-07-17 MED ORDER — IPRATROPIUM-ALBUTEROL 0.5-2.5 (3) MG/3ML IN SOLN
3.0000 mL | Freq: Once | RESPIRATORY_TRACT | Status: AC
  Administered 2021-07-17: 03:00:00 3 mL via RESPIRATORY_TRACT
  Filled 2021-07-17: qty 3

## 2021-07-17 MED ORDER — IPRATROPIUM-ALBUTEROL 0.5-2.5 (3) MG/3ML IN SOLN
3.0000 mL | Freq: Once | RESPIRATORY_TRACT | Status: AC
  Administered 2021-07-17: 02:00:00 3 mL via RESPIRATORY_TRACT
  Filled 2021-07-17: qty 3

## 2021-07-17 MED ORDER — DEXAMETHASONE 4 MG PO TABS
10.0000 mg | ORAL_TABLET | Freq: Once | ORAL | Status: AC
  Administered 2021-07-17: 01:00:00 10 mg via ORAL
  Filled 2021-07-17: qty 3

## 2021-07-17 MED ORDER — PREDNISOLONE 5 MG PO TABS
40.0000 mg | ORAL_TABLET | Freq: Once | ORAL | Status: DC
  Filled 2021-07-17: qty 8

## 2021-07-17 MED ORDER — IPRATROPIUM-ALBUTEROL 0.5-2.5 (3) MG/3ML IN SOLN
3.0000 mL | Freq: Once | RESPIRATORY_TRACT | Status: AC
  Administered 2021-07-17: 01:00:00 3 mL via RESPIRATORY_TRACT
  Filled 2021-07-17: qty 3

## 2021-07-17 NOTE — Discharge Instructions (Addendum)
Continue using your inhaler as needed.  Return if symptoms or not being adequately controlled at home.

## 2021-07-17 NOTE — Progress Notes (Signed)
Patient BS better.  Clear to auscultation.  Breathing treatment given.

## 2021-07-17 NOTE — ED Provider Notes (Signed)
[facility] EMERGENCY DEPARTMENT Provider Note   CSN: [id number] Arrival date & time: 07/16/21  2211     History  Chief Complaint  Patient presents with   Cough    Mariah [name] is a 36 y.o. female.  The history is provided by the patient.  Cough She has history of asthma and has had wheezing for the last 4 days.  Her child had been diagnosed with RSV 2 days earlier.  She has been coughing, and cough has been nonproductive until today when she started raising some brown sputum.  She denies any fever or chills but has had some sweats.  She had been using her albuterol inhaler and Flovent inhaler which had been giving her relief until today.  She is a non-smoker.   Home Medications Prior to Admission medications   Medication Sig Start Date End Date Taking? Authorizing Provider  acetaminophen (TYLENOL) 500 MG tablet Take 500 mg by mouth every 6 (six) hours as needed for mild pain or headache.    [provider]  albuterol (VENTOLIN HFA) 108 (90 Base) MCG/ACT inhaler Inhale 2 puffs into the lungs every 4 (four) hours as needed for wheezing or shortness of breath. 02/27/21   Valentina Shaggy, MD  albuterol (VENTOLIN HFA) 108 (90 Base) MCG/ACT inhaler Inhale 1-2 puffs into the lungs every 6 (six) hours as needed for wheezing or shortness of breath. 06/18/21   Volney American, PA-C  cetirizine (ZYRTEC) 10 MG tablet Take 10 mg by mouth daily as needed for allergies.    [provider]  fluticasone (FLONASE) 50 MCG/ACT nasal spray Place 2 sprays into both nostrils daily. Please dispense generic. 02/27/21   Valentina Shaggy, MD  fluticasone (FLOVENT HFA) 110 MCG/ACT inhaler Inhale 1 puff into the lungs 2 (two) times daily. During the winter season 02/27/21   Valentina Shaggy, MD  fluticasone [facility] HFA) 110 MCG/ACT inhaler Inhale 2 puffs into the lungs 2 (two) times daily. 06/18/21   Volney American, PA-C  ibuprofen (ADVIL) 600 MG tablet Take 1 tablet  (600 mg total) by mouth every 6 (six) hours. 08/09/20   Paula Compton, MD  Prenatal Vit-Fe Fumarate-FA (PRENATAL MULTIVITAMIN) TABS tablet Take 1 tablet by mouth daily at 12 noon.    [provider]  promethazine-dextromethorphan (PROMETHAZINE-DM) 6.25-15 MG/5ML syrup Take 5 mLs by mouth 4 (four) times daily as needed. 06/18/21   Volney American, PA-C  fexofenadine (ALLEGRA) 180 MG tablet Take 180 mg by mouth daily. Reported on 08/18/2015  09/30/19  [provider]      Allergies    Codeine, Prednisone, and Vicodin [hydrocodone-acetaminophen]    Review of Systems   Review of Systems  Respiratory:  Positive for cough.   All other systems reviewed and are negative.  Physical Exam Updated Vital Signs BP 124/83 (BP Location: Right Arm)    Pulse 99    Temp 99.3 F (37.4 C) (Oral)    Resp 20    Ht 5\' 2"  (1.575 m)    Wt 61.2 kg    LMP 07/13/2021 (Exact Date)    SpO2 100%    BMI 24.69 kg/m  Physical Exam Vitals and nursing note reviewed.  36 year old female, resting comfortably and in no acute distress. Vital signs are normal. Oxygen saturation is 100%, which is normal. Head is normocephalic and atraumatic. PERRLA, EOMI. Oropharynx is clear. Neck is nontender and supple without adenopathy or JVD. Back is nontender and there is no CVA  tenderness. Lungs have mild expiratory wheezes without rales or rhonchi. Chest is nontender. Heart has regular rate and rhythm without murmur. Abdomen is soft, flat, nontender. Extremities have no cyanosis or edema, full range of motion is present. Skin is warm and dry without rash. Neurologic: Mental status is normal, cranial nerves are intact, moves all extremities equally.  ED Results / Procedures / Treatments   Labs (all labs ordered are listed, but only abnormal results are displayed) Labs Reviewed  RESP PANEL BY RT-PCR (FLU A&B, COVID) [id number]    Radiology DG [facility] 1 View  Result Date: 07/17/2021 CLINICAL DATA:   Cough. EXAM: PORTABLE CHEST 1 VIEW COMPARISON:  None. FINDINGS: The heart size and mediastinal contours are within normal limits. Both lungs are clear. The visualized skeletal structures are unremarkable. IMPRESSION: No active disease. Electronically Signed   By: Anner Crete M.D.   On: 07/17/2021 00:33    Procedures Procedures    Medications Ordered in ED Medications  ipratropium-albuterol (DUONEB) 0.5-2.5 (3) MG/3ML nebulizer solution 3 mL (has no administration in time range)  ipratropium-albuterol (DUONEB) 0.5-2.5 (3) MG/3ML nebulizer solution 3 mL (3 mLs Nebulization Given 07/17/21 0032)  dexamethasone (DECADRON) tablet 10 mg (10 mg Oral Given 07/17/21 0047)  ipratropium-albuterol (DUONEB) 0.5-2.5 (3) MG/3ML nebulizer solution 3 mL (3 mLs Nebulization Given 07/17/21 0201)    ED Course/ Medical Decision Making/ A&P                           Medical Decision Making Amount and/or Complexity of Data Reviewed Radiology: ordered.  Risk Prescription drug management.   Cough with wheezing and patient with asthma.  Will check chest x-ray to rule out pneumonia.  This likely is RSV given known exposure, but will check respiratory pathogen panel.  She is given a dose of dexamethasone and nebulizer treatment with albuterol and ipratropium.  Old records are reviewed, showing outpatient management of asthma, no ED visits for asthma.  1:42 AM Chest x-ray shows no evidence of pneumonia.  Image is independently viewed by me, and I agree with radiologist interpretation.  Respiratory pathogen panel is negative for influenza and COVID-19.  Patient is not feeling any better following nebulizer treatment.  On exam, she continues to have wheezing.  Will give second nebulizer treatment.  She had no improvement with second and third nebulizer treatments.  Exam is unchanged.  However, she is not in any distress and maintaining adequate oxygen saturation on room air, no indication for hospital admission.   She is discharged with a short course of dexamethasone and advised to continue using her inhaler, return precautions discussed.        Final Clinical Impression(s) / ED Diagnoses Final diagnoses:  Acute bronchitis, unspecified organism    Rx / DC Orders ED Discharge Orders          Ordered    dexamethasone (DECADRON) 6 MG tablet  Daily,   Status:  Discontinued        07/17/21 0322    dexamethasone (DECADRON) 6 MG tablet  Daily        07/17/21 [id number]              Delora Fuel, MD [id number] [phone]

## 2021-07-19 ENCOUNTER — Ambulatory Visit: Payer: 59 | Admitting: Family Medicine

## 2021-07-24 ENCOUNTER — Ambulatory Visit (INDEPENDENT_AMBULATORY_CARE_PROVIDER_SITE_OTHER): Payer: 59 | Admitting: Family Medicine

## 2021-07-24 ENCOUNTER — Encounter: Payer: Self-pay | Admitting: Family Medicine

## 2021-07-24 ENCOUNTER — Emergency Department (HOSPITAL_BASED_OUTPATIENT_CLINIC_OR_DEPARTMENT_OTHER): Payer: 59

## 2021-07-24 ENCOUNTER — Encounter (HOSPITAL_BASED_OUTPATIENT_CLINIC_OR_DEPARTMENT_OTHER): Payer: Self-pay | Admitting: Urology

## 2021-07-24 ENCOUNTER — Telehealth: Payer: Self-pay | Admitting: Family Medicine

## 2021-07-24 ENCOUNTER — Other Ambulatory Visit: Payer: Self-pay

## 2021-07-24 ENCOUNTER — Emergency Department (HOSPITAL_BASED_OUTPATIENT_CLINIC_OR_DEPARTMENT_OTHER)
Admission: EM | Admit: 2021-07-24 | Discharge: 2021-07-24 | Disposition: A | Discharge status: Home / Self Care | Payer: 59 | Attending: Emergency Medicine | Admitting: Emergency Medicine

## 2021-07-24 VITALS — Ht 62.0 in | Wt 135.0 lb

## 2021-07-24 DIAGNOSIS — J45909 Unspecified asthma, uncomplicated: Secondary | ICD-10-CM | POA: Diagnosis not present

## 2021-07-24 DIAGNOSIS — R051 Acute cough: Secondary | ICD-10-CM

## 2021-07-24 DIAGNOSIS — Z20822 Contact with and (suspected) exposure to covid-19: Secondary | ICD-10-CM | POA: Diagnosis not present

## 2021-07-24 DIAGNOSIS — J4531 Mild persistent asthma with (acute) exacerbation: Secondary | ICD-10-CM | POA: Insufficient documentation

## 2021-07-24 DIAGNOSIS — K219 Gastro-esophageal reflux disease without esophagitis: Secondary | ICD-10-CM | POA: Diagnosis not present

## 2021-07-24 DIAGNOSIS — J189 Pneumonia, unspecified organism: Secondary | ICD-10-CM

## 2021-07-24 DIAGNOSIS — J3089 Other allergic rhinitis: Secondary | ICD-10-CM

## 2021-07-24 DIAGNOSIS — R0602 Shortness of breath: Secondary | ICD-10-CM

## 2021-07-24 DIAGNOSIS — R000 Tachycardia, unspecified: Secondary | ICD-10-CM | POA: Diagnosis not present

## 2021-07-24 DIAGNOSIS — R079 Chest pain, unspecified: Secondary | ICD-10-CM

## 2021-07-24 DIAGNOSIS — J0100 Acute maxillary sinusitis, unspecified: Secondary | ICD-10-CM | POA: Diagnosis not present

## 2021-07-24 DIAGNOSIS — R042 Hemoptysis: Secondary | ICD-10-CM

## 2021-07-24 LAB — CBC WITH DIFFERENTIAL/PLATELET
Abs Immature Granulocytes: 0 10*3/uL (ref 0.00–0.07)
Basophils Absolute: 0 10*3/uL (ref 0.0–0.1)
Basophils Relative: 0 %
Eosinophils Absolute: 0 10*3/uL (ref 0.0–0.5)
Eosinophils Relative: 0 %
HCT: 39.1 % (ref 36.0–46.0)
Hemoglobin: 12.6 g/dL (ref 12.0–15.0)
Lymphocytes Relative: 15 %
Lymphs Abs: 3.9 10*3/uL (ref 0.7–4.0)
MCH: 25.5 pg — ABNORMAL LOW (ref 26.0–34.0)
MCHC: 32.2 g/dL (ref 30.0–36.0)
MCV: 79 fL — ABNORMAL LOW (ref 80.0–100.0)
Monocytes Absolute: 0.3 10*3/uL (ref 0.1–1.0)
Monocytes Relative: 1 %
Neutro Abs: 21.8 10*3/uL — ABNORMAL HIGH (ref 1.7–7.7)
Neutrophils Relative %: 84 %
Platelets: 569 10*3/uL — ABNORMAL HIGH (ref 150–400)
RBC: 4.95 MIL/uL (ref 3.87–5.11)
RDW: 13.2 % (ref 11.5–15.5)
WBC: 25.9 10*3/uL — ABNORMAL HIGH (ref 4.0–10.5)
nRBC: 0 % (ref 0.0–0.2)

## 2021-07-24 LAB — RESP PANEL BY RT-PCR (FLU A&B, COVID) ARPGX2
Influenza A by PCR: NEGATIVE
Influenza B by PCR: NEGATIVE
SARS Coronavirus 2 by RT PCR: NEGATIVE

## 2021-07-24 LAB — COMPREHENSIVE METABOLIC PANEL
ALT: 19 U/L (ref 0–44)
AST: 13 U/L — ABNORMAL LOW (ref 15–41)
Albumin: 4.2 g/dL (ref 3.5–5.0)
Alkaline Phosphatase: 139 U/L — ABNORMAL HIGH (ref 38–126)
Anion gap: 14 (ref 5–15)
BUN: 9 mg/dL (ref 6–20)
CO2: 21 mmol/L — ABNORMAL LOW (ref 22–32)
Calcium: 9.3 mg/dL (ref 8.9–10.3)
Chloride: 101 mmol/L (ref 98–111)
Creatinine, Ser: 0.68 mg/dL (ref 0.44–1.00)
GFR, Estimated: >60 mL/min (ref >60)
Glucose, Bld: 129 mg/dL — ABNORMAL HIGH (ref 70–99)
Potassium: 3.8 mmol/L (ref 3.5–5.1)
Sodium: 136 mmol/L (ref 135–145)
Total Bilirubin: 0.4 mg/dL (ref 0.3–1.2)
Total Protein: 7.8 g/dL (ref 6.5–8.1)

## 2021-07-24 LAB — PREGNANCY, URINE: Preg Test, Ur: NEGATIVE

## 2021-07-24 LAB — TROPONIN I (HIGH SENSITIVITY): Troponin I (High Sensitivity): 3 ng/L (ref <18)

## 2021-07-24 LAB — BRAIN NATRIURETIC PEPTIDE: B Natriuretic Peptide: 21.1 pg/mL (ref 0.0–100.0)

## 2021-07-24 LAB — D-DIMER, QUANTITATIVE: D-Dimer, Quant: 0.38 ug/mL-FEU (ref 0.00–0.50)

## 2021-07-24 MED ORDER — AMOXICILLIN-POT CLAVULANATE 875-125 MG PO TABS: ORAL_TABLET | ORAL | 0 refills | Status: DC

## 2021-07-24 MED ORDER — SODIUM CHLORIDE 0.9 % IV BOLUS
1000.0000 mL | Freq: Once | INTRAVENOUS | Status: AC
  Administered 2021-07-24: 1000 mL via INTRAVENOUS

## 2021-07-24 MED ORDER — DOXYCYCLINE HYCLATE 100 MG PO CAPS: 100.0000 mg | ORAL_CAPSULE | Freq: Two times a day (BID) | ORAL | 0 refills | Status: AC

## 2021-07-24 MED ORDER — ALBUTEROL SULFATE (2.5 MG/3ML) 0.083% IN NEBU: 2.5000 mg | INHALATION_SOLUTION | RESPIRATORY_TRACT | 1 refills | Status: DC | PRN

## 2021-07-24 MED ORDER — ALBUTEROL SULFATE HFA 108 (90 BASE) MCG/ACT IN AERS
2.0000 | INHALATION_SPRAY | RESPIRATORY_TRACT | 1 refills | Status: DC | PRN
Start: 2021-07-24 — End: 2021-08-27

## 2021-07-24 MED ORDER — ALBUTEROL SULFATE HFA 108 (90 BASE) MCG/ACT IN AERS: 2.0000 | INHALATION_SPRAY | RESPIRATORY_TRACT | Status: DC | PRN

## 2021-07-24 MED ORDER — BENZONATATE 100 MG PO CAPS
100.0000 mg | ORAL_CAPSULE | Freq: Once | ORAL | Status: AC
  Administered 2021-07-24: 100 mg via ORAL
  Filled 2021-07-24: qty 1

## 2021-07-24 MED ORDER — PREDNISONE 20 MG PO TABS: 40.0000 mg | ORAL_TABLET | Freq: Every day | ORAL | 0 refills | Status: AC

## 2021-07-24 MED ORDER — IPRATROPIUM-ALBUTEROL 0.5-2.5 (3) MG/3ML IN SOLN
3.0000 mL | Freq: Once | RESPIRATORY_TRACT | Status: AC
  Administered 2021-07-24: 3 mL via RESPIRATORY_TRACT
  Filled 2021-07-24: qty 3

## 2021-07-24 MED ORDER — ONDANSETRON HCL 4 MG/2ML IJ SOLN
4.0000 mg | Freq: Once | INTRAMUSCULAR | Status: AC
  Administered 2021-07-24: 4 mg via INTRAVENOUS
  Filled 2021-07-24: qty 2

## 2021-07-24 MED ORDER — BENZONATATE 100 MG PO CAPS: 100.0000 mg | ORAL_CAPSULE | Freq: Three times a day (TID) | ORAL | 0 refills | Status: DC | PRN

## 2021-07-24 MED ORDER — IOHEXOL 350 MG/ML SOLN
75.0000 mL | Freq: Once | INTRAVENOUS | Status: AC | PRN
  Administered 2021-07-24: 75 mL via INTRAVENOUS

## 2021-07-24 MED ORDER — IPRATROPIUM BROMIDE 0.02 % IN SOLN: 0.5000 mg | Freq: Once | RESPIRATORY_TRACT | Status: DC

## 2021-07-24 MED ORDER — METHYLPREDNISOLONE SODIUM SUCC 125 MG IJ SOLR
125.0000 mg | Freq: Once | INTRAMUSCULAR | Status: AC
  Administered 2021-07-24: 125 mg via INTRAVENOUS
  Filled 2021-07-24: qty 2

## 2021-07-24 MED ORDER — AMOXICILLIN-POT CLAVULANATE 875-125 MG PO TABS: 1.0000 | ORAL_TABLET | Freq: Two times a day (BID) | ORAL | 0 refills | Status: AC

## 2021-07-24 MED ORDER — PREDNISONE 10 MG PO TABS: ORAL_TABLET | ORAL | 0 refills | Status: DC

## 2021-07-24 MED ORDER — BUDESONIDE 0.5 MG/2ML IN SUSP: RESPIRATORY_TRACT | 5 refills | Status: DC

## 2021-07-24 NOTE — Patient Instructions (Addendum)
Asthma Begin budesonide 0.5 mg via nebulizer twice a day to prevent cough or wheeze.  This will take the place of Flovent 110 for now Continue albuterol 2 puffs every 4 hours as needed for cough or wheeze OR Instead use albuterol 0.083% solution via nebulizer one unit vial every 4 hours as needed for cough or wheeze Begin prednisone 10 mg tablets. Take 2 tablets twice a day for 3 days, then take 2 tablets once a day for 1 day, then take 1 tablet on the 5th day, then stop  For the next few days, use albuterol in the morning, wait 20 minutes, and then use budesonide via nebulizer If no improvement in your symptoms in 1 to 2 days, we will move forward with chest x-ray  Acute sinusitis Begin Augmentin 875 mg twice a day for the next 10 days Continue nasal saline rinses several times a day Continue Mucinex 600 to 1200 mg twice a day and increase fluid intake in order to thin mucus secretions  Allergic rhinitis Continue allergen avoidance measures  Continue Flonase 2 sprays in each nostril once a day as needed for stuffy nose.  Do not use this medication if you are experiencing nasal bleeding Continue Allegra 180 mg once a day as needed for a runny nose or itch.  Try not to use this medication for the next several days while you are taking prednisone Continue saline nasal rinses as needed for nasal symptoms. Use this before any medicated nasal sprays for best result.  Reflux Begin dietary lifestyle modifications as listed below  Call the clinic if this treatment plan is not working well for you  Follow up in 2 days or sooner if needed.   Lifestyle Changes for Controlling GERD When you have GERD, stomach acid feels as if its backing up toward your mouth. Whether or not you take medication to control your GERD, your symptoms can often be improved with lifestyle changes.   Raise Your Head Reflux is more likely to strike when youre lying down flat, because stomach fluid can flow backward  more easily. Raising the head of your bed 4-6 inches can help. To do this: Slide blocks or books under the legs at the head of your bed. Or, place a wedge under the mattress. Many foam stores can make a suitable wedge for you. The wedge should run from your waist to the top of your head. Dont just prop your head on several pillows. This increases pressure on your stomach. It can make GERD worse.  Watch Your Eating Habits Certain foods may increase the acid in your stomach or relax the lower esophageal sphincter, making GERD more likely. Its best to avoid the following: Coffee, tea, and carbonated drinks (with and without caffeine) Fatty, fried, or spicy food Mint, chocolate, onions, and tomatoes Any other foods that seem to irritate your stomach or cause you pain  Relieve the Pressure Eat smaller meals, even if you have to eat more often. Dont lie down right after you eat. Wait a few hours for your stomach to empty. Avoid tight belts and tight-fitting clothes. Lose excess weight.  Tobacco and Alcohol Avoid smoking tobacco and drinking alcohol. They can make GERD symptoms worse.

## 2021-07-24 NOTE — ED Notes (Signed)
Patient transported to X-ray 

## 2021-07-24 NOTE — Telephone Encounter (Signed)
Patients husband stating needed to send the nebulizer to another pharmacy [facility] does not have but I only see the solution for the nebulizer husband asking for a return  call please advise

## 2021-07-24 NOTE — ED Provider Notes (Signed)
[facility] EMERGENCY DEPT Provider Note   CSN: [id number] Arrival date & time: 07/24/21  1405     History  Chief Complaint  Patient presents with   Shortness of Breath    Mariah [name] is a 36 y.o. female.  The history is provided by the patient, the spouse and medical records. No language interpreter was used.  Shortness of Breath Severity:  Severe Onset quality:  Gradual Duration:  2 days Timing:  Constant Progression:  Worsening Chronicity:  New Context: URI   Relieved by:  Nothing Worsened by:  Deep breathing, coughing and movement Ineffective treatments:  Inhaler Associated symptoms: chest pain, cough, hemoptysis and sputum production   Associated symptoms: no abdominal pain, no fever (chills present), no headaches, no neck pain, no rash, no syncope, no vomiting and no wheezing   Chest pain:    Quality: aching, pressure and tightness     Severity:  Severe   Onset quality:  Gradual   Duration:  2 days   Timing:  Constant   Progression:  Waxing and waning   Chronicity:  New Risk factors: no family hx of DVT       Home Medications Prior to Admission medications   Medication Sig Start Date End Date Taking? Authorizing Provider  acetaminophen (TYLENOL) 500 MG tablet Take 500 mg by mouth every 6 (six) hours as needed for mild pain or headache.    [provider]  albuterol (PROVENTIL) (2.5 MG/3ML) 0.083% nebulizer solution Take 3 mLs (2.5 mg total) by nebulization every 4 (four) hours as needed for wheezing or shortness of breath. 07/24/21   Dara Hoyer, FNP  albuterol (VENTOLIN HFA) 108 (90 Base) MCG/ACT inhaler Inhale 2 puffs into the lungs every 4 (four) hours as needed for wheezing or shortness of breath. 02/27/21   Valentina Shaggy, MD  albuterol (VENTOLIN HFA) 108 (90 Base) MCG/ACT inhaler Inhale 2 puffs into the lungs every 4 (four) hours as needed for wheezing or shortness of breath. 07/24/21   Dara Hoyer, FNP  ALPRAZolam Duanne Moron)  0.5 MG tablet  06/22/21   [provider]  amoxicillin-clavulanate (AUGMENTIN) 875-125 MG tablet Take one tablet twice a day for 10 days, for infection. 07/24/21   Dara Hoyer, FNP  budesonide (PULMICORT) 0.5 MG/2ML nebulizer solution One vial in nebulizer twice a day to prevent coughing or wheezing. 07/24/21   Ambs, Kathrine Cords, FNP  cetirizine (ZYRTEC) 10 MG tablet Take 10 mg by mouth daily as needed for allergies.    [provider]  clindamycin (CLEOCIN T) 1 % external solution clindamycin phosphate 1 % topical solution  APPLY TO AFFECTED AREA ON SKIN 2 TIMES DAILY    [provider]  drospirenone-ethinyl estradiol (YAZ) 3-0.02 MG tablet Take 1 tablet by mouth daily. 06/26/21   [provider]  escitalopram (LEXAPRO) 10 MG tablet escitalopram 10 mg tablet  TAKE 1 TABLET 1tpo EVERY DAY    [provider]  fluticasone (FLONASE) 50 MCG/ACT nasal spray Place 2 sprays into both nostrils daily. Please dispense generic. 02/27/21   Valentina Shaggy, MD  ibuprofen (ADVIL) 600 MG tablet Take 1 tablet (600 mg total) by mouth every 6 (six) hours. 08/09/20   Paula Compton, MD  predniSONE (DELTASONE) 10 MG tablet Take 2 tablets twice a day for 3 days, then 2 tablets once a day for 1 day, then take 1 tablet on the 5th day, then stop. 07/24/21   Dara Hoyer, FNP  Prenatal Vit-Fe  Fumarate-FA (PRENATAL MULTIVITAMIN) TABS tablet Take 1 tablet by mouth daily at 12 noon.    [provider]  tretinoin (RETIN-A) 0.025 % cream tretinoin 0.025 % topical cream  APPLY TO AFFECTED AREA ON FACE EVERY EVENING    [provider]  fexofenadine (ALLEGRA) 180 MG tablet Take 180 mg by mouth daily. Reported on 08/18/2015  09/30/19  [provider]      Allergies    Codeine, Prednisone, and Vicodin [hydrocodone-acetaminophen]    Review of Systems   Review of Systems  Constitutional:  Positive for chills. Negative for fatigue and fever (chills present).  HENT:   Positive for congestion. Negative for rhinorrhea.   Eyes:  Negative for visual disturbance.  Respiratory:  Positive for cough, hemoptysis, sputum production, chest tightness and shortness of breath. Negative for wheezing.   Cardiovascular:  Positive for chest pain. Negative for palpitations, leg swelling and syncope.  Gastrointestinal:  Positive for nausea. Negative for abdominal pain, constipation, diarrhea and vomiting.  Genitourinary:  Negative for dysuria, flank pain and frequency.  Musculoskeletal:  Negative for back pain, neck pain and neck stiffness.  Skin:  Negative for rash and wound.  Neurological:  Negative for weakness, light-headedness, numbness and headaches.  Psychiatric/Behavioral:  Negative for agitation and confusion.   All other systems reviewed and are negative.  Physical Exam Updated Vital Signs BP 134/76    Pulse (!) 141    Temp 97.7 F (36.5 C)    Resp (!) 24    Ht 5\' 2"  (1.575 m)    Wt 61.2 kg    LMP 07/13/2021 (Exact Date)    SpO2 100%    BMI 24.69 kg/m  Physical Exam Vitals and nursing note reviewed.  Constitutional:      General: She is not in acute distress.    Appearance: She is well-developed. She is ill-appearing. She is not toxic-appearing or diaphoretic.  HENT:     Head: Normocephalic and atraumatic.  Eyes:     Conjunctiva/sclera: Conjunctivae normal.  Cardiovascular:     Rate and Rhythm: Regular rhythm. Tachycardia present.     Pulses: Normal pulses.     Heart sounds: No murmur heard. Pulmonary:     Effort: Tachypnea present. No respiratory distress.     Breath sounds: No stridor. Rhonchi present. No decreased breath sounds, wheezing or rales.  Chest:     Chest wall: Tenderness present.  Abdominal:     Palpations: Abdomen is soft.     Tenderness: There is no abdominal tenderness.  Musculoskeletal:        General: No swelling.     Cervical back: Neck supple.     Right lower leg: No tenderness. No edema.     Left lower leg: No tenderness. No  edema.  Skin:    General: Skin is warm and dry.     Capillary Refill: Capillary refill takes less than 2 seconds.     Findings: No erythema.  Neurological:     General: No focal deficit present.     Mental Status: She is alert.  Psychiatric:        Mood and Affect: Mood is anxious.    ED Results / Procedures / Treatments   Labs (all labs ordered are listed, but only abnormal results are displayed) Labs Reviewed  CBC WITH DIFFERENTIAL/PLATELET - Abnormal; Notable for the following components:      Result Value   WBC 25.9 (*)    MCV 79.0 (*)    MCH 25.5 (*)  Platelets 569 (*)    Neutro Abs 21.8 (*)    All other components within normal limits  COMPREHENSIVE METABOLIC PANEL - Abnormal; Notable for the following components:   CO2 21 (*)    Glucose, Bld 129 (*)    AST 13 (*)    Alkaline Phosphatase 139 (*)    All other components within normal limits  RESP PANEL BY RT-PCR (FLU A&B, COVID) ARPGX2  D-DIMER, QUANTITATIVE  PREGNANCY, URINE  BRAIN NATRIURETIC PEPTIDE  TROPONIN I (HIGH SENSITIVITY)  TROPONIN I (HIGH SENSITIVITY)    EKG EKG Interpretation  Date/Time:  Tuesday July 24 2021 14:22:08 EST Ventricular Rate:  137 PR Interval:  124 QRS Duration: 66 QT Interval:  280 QTC Calculation: 422 R Axis:   94 Text Interpretation: Sinus tachycardia Rightward axis Borderline ECG No previous ECGs available No prior ECG for comparison. No STEMI Confirmed by Antony Blackbird [phone]) on 07/24/2021 3:08:24 PM  Radiology CT Angio Chest PE W and/or Wo Contrast  Result Date: 07/24/2021 CLINICAL DATA:  Chest pain. EXAM: CT ANGIOGRAPHY CHEST WITH CONTRAST TECHNIQUE: Multidetector CT imaging of the chest was performed using the standard protocol during bolus administration of intravenous contrast. Multiplanar CT image reconstructions and MIPs were obtained to evaluate the vascular anatomy. RADIATION DOSE REDUCTION: This exam was performed according to the departmental dose-optimization  program which includes automated exposure control, adjustment of the mA and/or kV according to patient size and/or use of iterative reconstruction technique. CONTRAST:  28mL OMNIPAQUE IOHEXOL 350 MG/ML SOLN COMPARISON:  Radiograph of same day. FINDINGS: Cardiovascular: Satisfactory opacification of the pulmonary arteries to the segmental level. No evidence of pulmonary embolism. Normal heart size. No pericardial effusion. Mediastinum/Nodes: No enlarged mediastinal, hilar, or axillary lymph nodes. Thyroid gland, trachea, and esophagus demonstrate no significant findings. Lungs/Pleura: No pneumothorax or pleural effusion is noted. Right infrahilar airspace opacity is noted in right lower lobe most consistent with pneumonia. Mild left posterior basilar subsegmental atelectasis or infiltrate is noted. Upper Abdomen: No acute abnormality. Musculoskeletal: No chest wall abnormality. No acute or significant osseous findings. Review of the MIP images confirms the above findings. IMPRESSION: No definite evidence of pulmonary embolus. Right lower lobe airspace opacity is noted consistent with pneumonia. Mild left posterior basilar subsegmental atelectasis or pneumonia is noted. Electronically Signed   By: Marijo Conception M.D.   On: 07/24/2021 16:46   DG Chest Port 1 View  Result Date: 07/24/2021 CLINICAL DATA:  Shortness of breath EXAM: PORTABLE CHEST 1 VIEW COMPARISON:  Chest radiograph dated July 17, 2021 FINDINGS: The heart size and mediastinal contours are within normal limits. Both lungs are clear. The visualized skeletal structures are unremarkable. IMPRESSION: No active disease. Electronically Signed   By: Keane Police D.O.   On: 07/24/2021 14:56    Procedures Procedures    Medications Ordered in ED Medications  albuterol (VENTOLIN HFA) 108 (90 Base) MCG/ACT inhaler 2 puff (has no administration in time range)  methylPREDNISolone sodium succinate (SOLU-MEDROL) 125 mg/2 mL injection 125 mg (125 mg  Intravenous Given 07/24/21 1514)  ondansetron (ZOFRAN) injection 4 mg (4 mg Intravenous Given 07/24/21 1511)  ipratropium-albuterol (DUONEB) 0.5-2.5 (3) MG/3ML nebulizer solution 3 mL (3 mLs Nebulization Given 07/24/21 1443)  benzonatate (TESSALON) capsule 100 mg (100 mg Oral Given 07/24/21 1511)  sodium chloride 0.9 % bolus 1,000 mL (0 mLs Intravenous Stopped 07/24/21 1732)  iohexol (OMNIPAQUE) 350 MG/ML injection 75 mL (75 mLs Intravenous Contrast Given 07/24/21 1627)    ED Course/ Medical Decision Making/ A&P  Medical Decision Making Amount and/or Complexity of Data Reviewed Independent Historian: spouse External Data Reviewed: notes. Labs: ordered. Radiology: ordered.  Risk Prescription drug management.   NANCEE KARAHALIOS is a 36 y.o. female with a past medical history significant for anxiety, asthma, GERD, and recent exposure to a child with RSV several weeks ago who presents with worsening increased work of breathing, chest pain, shortness of breath, tachycardia, antibiotics.  According to patient, 3 weeks ago her out had RSV but patient was not having symptoms.  She reports that a several days ago she started having onset of extreme shortness of breath.  She does have asthma and has tried inhalers without significant relief.  She was seen initially and had a reassuring chest x-ray but then symptoms acutely worsened over the last few days.  She arrives with a heart rate in the 140s, having chest pressure/pain, and with increased shortness of breath.  Her oxygen saturations were not low however she was using some accessory muscles.  She reports she is coughing up all different colors of phlegm but also hemoptysis.  She denies history of DVT, PE, or any family history of this.  She says she is having chills but has not taken her temperature at home.  She reports some nausea but no vomiting.  No significant constipation, diarrhea, or urinary changes.  No leg pain or leg  swelling.  On exam, patient does have increased work of breathing and is tachycardic and tachypneic.  She is on room air.  Heart rate is in the 140s during my eval.  Chest was tender to palpation and I did not appreciate a murmur.  Lungs had some rhonchi in the bases.  No wheezing was appreciated.  Patient is already received a DuoNeb and Solu-Medrol prior to my eval while in screening triage.  Abdomen was nontender.  Flanks nontender.  Legs are nontender and nonedematous.  EKG shows sinus tachycardia.  Had a long shared decision-making conversation with patient and family whom is an EMS provider in this local area.  We together agreed that although the initial x-ray is reassuring, I am still concerned there could be a more occult pneumonia versus a pulmonary embolism causing her symptoms.  We will get CT PE study and will also make sure there is not a post viral myocarditis or some other heart failure causing the symptoms.  We will get troponins, BNP, and CT PE study to go along with work-up initially.  Patient was found to have increased white count up to 25.9 and COVID and flu are negative.  Metabolic panel initially reassuring.  4:19 PM BNP not elevated.  Initial troponin negative, will trend.  She will be given some fluids for some likely dehydration to see if this helps her heart rate as well.  Anticipate reassessment after work-up.  5:28 PM troponin negative.  BNP not elevated.  COVID and flu negative.  CT PE study confirms pneumonia but no evidence of pulmonary embolism.  Heart rate is improved after fluids and she maintains oxygen saturations in the upper 90s.  She is feeling relieved and better given the work-up results.  Will p.o. challenge the patient and if she passes, anticipate she will be stable for discharge home with antibiotics for the pneumonia.  We will also give a burst of steroids for the next few days given her reported wheezing and breathing troubles that respond to  steroids as well.  After discussing with the patient, we will do combination of Augmentin and  doxycycline.  She is scheduled follow-up with her PCP in 2 days and will keep that appointment.  She understands actually strict return precautions for new or worsening symptoms related to her pneumonia.        Final Clinical Impression(s) / ED Diagnoses Final diagnoses:  Chest pain, unspecified type  Shortness of breath  Acute cough  Hemoptysis  Tachycardia with heart rate 141-160 beats per minute  Community acquired pneumonia, bilateral    Rx / DC Orders ED Discharge Orders          Ordered    predniSONE (DELTASONE) 20 MG tablet  Daily        07/24/21 1737    amoxicillin-clavulanate (AUGMENTIN) 875-125 MG tablet  Every 12 hours        07/24/21 1737    doxycycline (VIBRAMYCIN) 100 MG capsule  2 times daily        07/24/21 1737            Clinical Impression: 1. Community acquired pneumonia, bilateral   2. Chest pain, unspecified type   3. Shortness of breath   4. Acute cough   5. Hemoptysis   6. Tachycardia with heart rate 141-160 beats per minute     Disposition: Discharge  Condition: Good  I have discussed the results, Dx and Tx plan with the pt(& family if present). He/she/they expressed understanding and agree(s) with the plan. Discharge instructions discussed at great length. Strict return precautions discussed and pt &/or family have verbalized understanding of the instructions. No further questions at time of discharge.    New Prescriptions   AMOXICILLIN-CLAVULANATE (AUGMENTIN) 875-125 MG TABLET    Take 1 tablet by mouth every 12 (twelve) hours for 7 days.   DOXYCYCLINE (VIBRAMYCIN) 100 MG CAPSULE    Take 1 capsule (100 mg total) by mouth 2 (two) times daily for 7 days.   PREDNISONE (DELTASONE) 20 MG TABLET    Take 2 tablets (40 mg total) by mouth daily for 5 days.    Follow Up: your PCP     [facility] [place] [phone]       Bane Hagy, Gwenyth Allegra, MD 07/24/21 [phone]

## 2021-07-24 NOTE — Discharge Instructions (Signed)
Your history, exam, work-up today confirm pneumonia is the likely cause of your combination of symptoms.  Your heart rate improved after the fluids and your CT scan confirmed pneumonia bilaterally.  There is no evidence of blood clot and your cardiac enzyme test was negative.  Your heart failure test was also negative and your other labs were consistent with an inflammatory infection.  He received steroids today but please take the burst of steroids for the next 5 days starting tomorrow.  Please follow-up with your PCP in 2 days.  Please take the combination of antibiotics we discussed and use your inhaler at home.  Please rest and stay hydrated.  If any symptoms change or worsen acutely, please return to the nearest emergency department.

## 2021-07-24 NOTE — Progress Notes (Signed)
RE: RAMON RIDLING MRN: [id number] DOB: [birth year] Date of Telemedicine Visit: 07/24/2021  Referring provider: No ref. provider found Primary care provider: Pcp, No  Chief Complaint: Cough (Coughing  a lot and coughing up phlegm brown,bloody,green, having facial pressure.  She is very sore from coughing.) and Wheezing (Wheezing all the time)   Telemedicine Follow Up Visit via Telephone: I connected with Cyenna Mcwright for a follow up on 07/24/21 by telephone and verified that I am speaking with the correct person using two identifiers.   I discussed the limitations, risks, security and privacy concerns of performing an evaluation and management service by telephone and the availability of in person appointments. I also discussed with the patient that there may be a patient responsible charge related to this service. The patient expressed understanding and agreed to proceed.  Patient is at home  Provider is at the office.  Visit start time: 1033 Visit end time: [place] consent/check in by: [facility] Medical consent and medical assistant/nurse: Sharyn Lull  History of Present Illness: She is a 36 y.o. female, who is being followed for asthma and allergic rhinitis. Her previous allergy office visit was on 03/19/2021 with Dr. Ernst Bowler.  In the interim, she has been to urgent care on 06/18/2021 for evaluation of a viral illness for which she received a steroid with resolution of symptoms.  She went to the emergency department on 07/16/2021 for evaluation of respiratory symptoms where she received a steroid with no relief of symptoms.  Of note, her daughter had recently had RSV 2 days before this appointment.  Chart review indicates she had a chest x-ray with no signs of active disease.  At today's visit, she reports that she did receive some benefit from the steroid that she received on 07/16/2021, however, she reports this was short-lived.  She states that after the steroid was gone, on the third  day, she began to experience an increase in symptoms including shortness of breath with activity and occluding talking and rest, wheezing occurring in the daytime and nighttime, and cough which had been productive but is now dry.  She continues Flovent 110 and albuterol, however, she states that she cannot take a breath in and hold it to receive any benefit from these medications.  She reports that before she developed RSV on 07/16/2021 her asthma had been well controlled and she was not using Flovent or albuterol.  She does not have a nebulizer at this time.  She reports that on 07/19/2021 she began to develop thick green and brown nasal secretions, nasal congestion, headache, occasional sneezing, and is unsure if she is having any postnasal drainage.  She continues Mucinex, Delsym, and saline nasal rinses several times a day with no relief of symptoms.  She is not currently taking an antihistamine or using a nasal steroid spray.  She does report occasional nasal bleeding which is noted on the tissue.  She reports a possible low-grade intermittent fever and occasional full body sweats.  She denies chills.  She does report that she is having some burning in her throat after she has been coughing.  She denies reflux on a regular basis and is not taking medication to control reflux.  Her current medications are listed in the chart.  Chest x-ray from 07/16/2021 listed below "DG Chest [facility] 1 View   Result Date: 07/17/2021 CLINICAL DATA:  Cough. EXAM: PORTABLE CHEST 1 VIEW COMPARISON:  None. FINDINGS: The heart size and mediastinal contours are within normal limits. Both lungs  are clear. The visualized skeletal structures are unremarkable. IMPRESSION: No active disease. Electronically Signed   By: Anner Crete M.D.   On: 07/17/2021 00:33 "   Assessment and Plan: Radonna is a 36 y.o. female with: Patient Instructions  Asthma Begin budesonide 0.5 mg via nebulizer twice a day to prevent cough or wheeze.  This will  take the place of Flovent 110 for now Continue albuterol 2 puffs every 4 hours as needed for cough or wheeze OR Instead use albuterol 0.083% solution via nebulizer one unit vial every 4 hours as needed for cough or wheeze Begin prednisone 10 mg tablets. Take 2 tablets twice a day for 3 days, then take 2 tablets once a day for 1 day, then take 1 tablet on the 5th day, then stop  For the next few days, use albuterol in the morning, wait 20 minutes, and then use budesonide via nebulizer If no improvement in your symptoms in 1 to 2 days, we will move forward with chest x-ray  Acute sinusitis Begin Augmentin 875 mg twice a day for the next 10 days Continue nasal saline rinses several times a day Continue Mucinex 600 to 1200 mg twice a day and increase fluid intake in order to thin mucus secretions  Allergic rhinitis Continue allergen avoidance measures  Continue Flonase 2 sprays in each nostril once a day as needed for stuffy nose.  Do not use this medication if you are experiencing nasal bleeding Continue Allegra 180 mg once a day as needed for a runny nose or itch.  Try not to use this medication for the next several days while you are taking prednisone Continue saline nasal rinses as needed for nasal symptoms. Use this before any medicated nasal sprays for best result.  Reflux Begin dietary lifestyle modifications as listed below  Call the clinic if this treatment plan is not working well for you  Follow up in 2 days or sooner if needed.   Return in about 2 days (around 07/26/2021), or if symptoms worsen or fail to improve.  Meds ordered this encounter  Medications   albuterol (PROVENTIL) (2.5 MG/3ML) 0.083% nebulizer solution    Sig: Take 3 mLs (2.5 mg total) by nebulization every 4 (four) hours as needed for wheezing or shortness of breath.    Dispense:  75 mL    Refill:  1   budesonide (PULMICORT) 0.5 MG/2ML nebulizer solution    Sig: One vial in nebulizer twice a day to prevent  coughing or wheezing.    Dispense:  120 mL    Refill:  5   amoxicillin-clavulanate (AUGMENTIN) 875-125 MG tablet    Sig: Take one tablet twice a day for 10 days, for infection.    Dispense:  20 tablet    Refill:  0   albuterol (VENTOLIN HFA) 108 (90 Base) MCG/ACT inhaler    Sig: Inhale 2 puffs into the lungs every 4 (four) hours as needed for wheezing or shortness of breath.    Dispense:  1 each    Refill:  1   predniSONE (DELTASONE) 10 MG tablet    Sig: Take 2 tablets twice a day for 3 days, then 2 tablets once a day for 1 day, then take 1 tablet on the 5th day, then stop.    Dispense:  15 tablet    Refill:  0    Medication List:  Current Outpatient Medications  Medication Sig Dispense Refill   acetaminophen (TYLENOL) 500 MG tablet Take 500 mg by  mouth every 6 (six) hours as needed for mild pain or headache.     albuterol (PROVENTIL) (2.5 MG/3ML) 0.083% nebulizer solution Take 3 mLs (2.5 mg total) by nebulization every 4 (four) hours as needed for wheezing or shortness of breath. 75 mL 1   albuterol (VENTOLIN HFA) 108 (90 Base) MCG/ACT inhaler Inhale 2 puffs into the lungs every 4 (four) hours as needed for wheezing or shortness of breath. 18 g 2   albuterol (VENTOLIN HFA) 108 (90 Base) MCG/ACT inhaler Inhale 2 puffs into the lungs every 4 (four) hours as needed for wheezing or shortness of breath. 1 each 1   ALPRAZolam (XANAX) 0.5 MG tablet      amoxicillin-clavulanate (AUGMENTIN) 875-125 MG tablet Take one tablet twice a day for 10 days, for infection. 20 tablet 0   budesonide (PULMICORT) 0.5 MG/2ML nebulizer solution One vial in nebulizer twice a day to prevent coughing or wheezing. 120 mL 5   cetirizine (ZYRTEC) 10 MG tablet Take 10 mg by mouth daily as needed for allergies.     clindamycin (CLEOCIN T) 1 % external solution clindamycin phosphate 1 % topical solution  APPLY TO AFFECTED AREA ON SKIN 2 TIMES DAILY     drospirenone-ethinyl estradiol (YAZ) 3-0.02 MG tablet Take 1 tablet  by mouth daily.     escitalopram (LEXAPRO) 10 MG tablet escitalopram 10 mg tablet  TAKE 1 TABLET 1tpo EVERY DAY     fluticasone (FLONASE) 50 MCG/ACT nasal spray Place 2 sprays into both nostrils daily. Please dispense generic. 16 g 5   ibuprofen (ADVIL) 600 MG tablet Take 1 tablet (600 mg total) by mouth every 6 (six) hours. 30 tablet 0   predniSONE (DELTASONE) 10 MG tablet Take 2 tablets twice a day for 3 days, then 2 tablets once a day for 1 day, then take 1 tablet on the 5th day, then stop. 15 tablet 0   Prenatal Vit-Fe Fumarate-FA (PRENATAL MULTIVITAMIN) TABS tablet Take 1 tablet by mouth daily at 12 noon.     tretinoin (RETIN-A) 0.025 % cream tretinoin 0.025 % topical cream  APPLY TO AFFECTED AREA ON FACE EVERY EVENING     No current facility-administered medications for this visit.   Allergies: Allergies  Allergen Reactions   Codeine Nausea Only and Other (See Comments)    Passes out   Prednisone Nausea Only and Other (See Comments)    DIZZINESS.    Vicodin [Hydrocodone-Acetaminophen] Nausea Only and Other (See Comments)    Passes out   I reviewed her past medical history, social history, family history, and environmental history and no significant changes have been reported from previous visit on 03/19/2021.  Review of Systems  Constitutional:  Positive for diaphoresis and fever.  HENT:  Positive for congestion, nosebleeds, rhinorrhea, sinus pressure and sinus pain.   Eyes: Negative.   Respiratory:  Positive for cough, chest tightness, shortness of breath and wheezing.   Cardiovascular: Negative.   Gastrointestinal: Negative.   Endocrine: Negative.   Genitourinary: Negative.   Musculoskeletal: Negative.   Skin: Negative.   Neurological: Negative.   Psychiatric/Behavioral: Negative.    Objective: Physical Exam Not obtained as encounter was done via telephone.   Previous notes and tests were reviewed.  I discussed the assessment and treatment plan with the patient. The  patient was provided an opportunity to ask questions and all were answered. The patient agreed with the plan and demonstrated an understanding of the instructions.   The patient was advised to call back or  seek an in-person evaluation if the symptoms worsen or if the condition fails to improve as anticipated.  I provided 27 minutes of non-face-to-face time during this encounter.  It was my pleasure to participate in [place] care today. Please feel free to contact me with any questions or concerns.   Sincerely,  Gareth Morgan, FNP

## 2021-07-24 NOTE — ED Provider Triage Note (Signed)
Emergency Medicine Provider Triage Evaluation Note  Mariah [name] , a 36 y.o. female  was evaluated in triage.  Pt complains of cough and shortness of breath over the past several weeks.  Was seen at [facility] and told that she had a virus.  Family member at home was sick with RSV.  Patient presents with increased work of breathing over the past 4 to 5 hours.  No wheezing.  Oxygen level has been normal per husband who is a paramedic.  She is tachycardic.  No history of DVT, PE.  She is on OCPs.  Reports generalized chest pain.  Review of Systems  Positive: SOB, cough Negative: fever  Physical Exam  BP 134/76    Pulse (!) 141    Temp 97.7 F (36.5 C)    Resp (!) 24    Ht 5\' 2"  (1.575 m)    Wt 61.2 kg    LMP 07/13/2021 (Exact Date)    SpO2 100%    BMI 24.69 kg/m  Gen:   Awake, appears uncomfortable Resp:  Increased work of breathing, coughing paroxysms, no wheezing, good air movement bilaterally MSK:   Moves extremities without difficulty  Other:  Tachycardic  Medical Decision Making  Medically screening exam initiated at 2:29 PM.  Appropriate orders placed.  MAZIE FENCL was informed that the remainder of the evaluation will be completed by another provider, this initial triage assessment does not replace that evaluation, and the importance of remaining in the ED until their evaluation is complete.     Veverly Fells, PA-C 07/24/21 1431

## 2021-07-24 NOTE — ED Triage Notes (Signed)
SOB worsening over past 2 days Seen for same at [facility] last week for RSV Productive cough  Albuterol 30 min pTA  Very labored breathing, tripoding, accessory muscle use

## 2021-07-25 ENCOUNTER — Telehealth: Payer: Self-pay | Admitting: Family Medicine

## 2021-07-25 NOTE — Telephone Encounter (Signed)
Spoke to pt about her nebulizer not being at the pharmacy. Pt was advise since she has appt with [facility] tomorrow in [place], that she can ask them to give her the nebulizer machine and they will have her sign the form.

## 2021-07-25 NOTE — Telephone Encounter (Signed)
Pt states she had to go to Er for shortness of breath last night,  had a CT scan and pneumonia was found, pt says she can breathe better and to Thurston Hole she says Thank you so much!

## 2021-07-26 ENCOUNTER — Encounter: Payer: Self-pay | Admitting: Allergy & Immunology

## 2021-07-26 ENCOUNTER — Ambulatory Visit: Payer: 59 | Admitting: Allergy & Immunology

## 2021-07-26 ENCOUNTER — Other Ambulatory Visit: Payer: Self-pay

## 2021-07-26 VITALS — BP 104/70 | HR 114 | Temp 97.6°F | Resp 16 | Ht 62.0 in | Wt 135.0 lb

## 2021-07-26 DIAGNOSIS — K219 Gastro-esophageal reflux disease without esophagitis: Secondary | ICD-10-CM | POA: Diagnosis not present

## 2021-07-26 DIAGNOSIS — J3089 Other allergic rhinitis: Secondary | ICD-10-CM | POA: Diagnosis not present

## 2021-07-26 DIAGNOSIS — J4531 Mild persistent asthma with (acute) exacerbation: Secondary | ICD-10-CM

## 2021-07-26 DIAGNOSIS — J189 Pneumonia, unspecified organism: Secondary | ICD-10-CM | POA: Diagnosis not present

## 2021-07-26 MED ORDER — PROMETHAZINE-DM 6.25-15 MG/5ML PO SYRP: 5.0000 mL | ORAL_SOLUTION | Freq: Four times a day (QID) | ORAL | 0 refills | Status: DC | PRN

## 2021-07-26 MED ORDER — METHYLPREDNISOLONE ACETATE 40 MG/ML IJ SUSP
40.0000 mg | Freq: Once | INTRAMUSCULAR | Status: AC
  Administered 2021-07-26: 40 mg via INTRAMUSCULAR

## 2021-07-26 NOTE — Progress Notes (Signed)
FOLLOW UP  Date of Service/Encounter:  07/26/21   Assessment:   Mild intermittent asthma, uncomplicated - with worsening symptoms as of late (starting controller medication during the winter season)   Allergic rhinitis - s/p allergen immunotherapy x 2    PNA - still with continued coughing even on the doxy and Augmentin   Member of [facility]    Plan/Recommendations:   Asthma exacerbation with pneumonia - Complete the course of antibiotics that were prescribed by the Doctor at the ED. - I would definitely start the Pulmicort prescribed by the Nurse Practitioner Thurston Hole. - I would continue the Pulmicort twice daily for two weeks at least (can stop as long as you are feeling better).  - Complete the steroids that were prescribed by the Nurse Practitioner Thurston Hole. - Prescription written for promethazine cough syrup. - Be sure to go home and take care of your self.   2. Return in about 2 months (around 09/23/2021).   Subjective:   Mariah [name] is a 36 y.o. female presenting today for follow up of  Chief Complaint  Patient presents with   Asthma    Says she has pneumonia and was diagnosed. Wants to be reevaluated and wants a nebulizer.    Mariah [name] has a history of the following: Patient Active Problem List   Diagnosis Date Noted   Mild persistent asthma with acute exacerbation 07/24/2021   Acute non-recurrent maxillary sinusitis 07/24/2021   Gastroesophageal reflux disease 07/24/2021   Normal labor 08/07/2020   Pruritus of vagina 07/01/2019   URI (upper respiratory infection) 01/28/2017   Missed period 01/28/2017   Encounter to establish care 05/04/2015   Motion sickness 05/04/2015   Chronic urticaria 02/25/2015   Mild intermittent asthma 02/25/2015   Rash and nonspecific skin eruption 09/21/2012   Left knee pain 08/02/2011   Contraception management 05/13/2011   PAP SMEAR, ABNORMAL 04/27/2009   SYNCOPE 02/08/2009   Other allergic rhinitis 12/21/2008    Asthma, chronic 08/18/2007   Generalized anxiety disorder 03/11/2007   PANIC ATTACK 03/11/2007    History obtained from: chart review and patient.  Mariah [name] is a 36 y.o. female presenting for a follow up visit.  I last saw her in September 2022.  At that time, her lung testing looked amazing.  We started her on Flovent 110 mcg 1 puff twice daily during the winter season to prevent any asthma attacks.  For her rhinitis, we continue with Allegra as well as Flonase.  In the interim, she had a telephone visit a couple of days ago with Delene Loll, one of our nurse practitioners.  At that time, she was started on Pulmicort 0.5 mg twice daily via nebulizer and albuterol 2 puffs every 4 hours as needed.  She was also started on prednisone.  For sinusitis, she was started on Augmentin 875 mg twice daily as well as Mucinex.  For her allergic rhinitis, she was continued on Flonase as well as Allegra.  Following that televisit, she did end up going to the emergency room and was diagnosed with pneumonia via chest CT. BNP was normal.  COVID and flu testing were negative.  There was no pulmonary embolism.  Doxycycline was added to her Augmentin.  Chest CT (07/24/21) No definite evidence of pulmonary embolus.   Right lower lobe airspace opacity is noted consistent with pneumonia. Mild left posterior basilar subsegmental atelectasis or pneumonia is noted  She thinks that she got the RSV form her 51mo daughter. She had a  CXR that was negative on 07/16/21. She got dexamethasone for 3 days which helped a little but everything came back aggressively following this. The following week she became much worse. Between waiting for the medications to be sent to the pharmacy and picking up the medications, she felt that she was "drowning", she went to [facility] and she had a CT scan because they were worried about a pulmonary embolism.   She has started the doxycyline and Augmentin. Antibiotics are definitely working and she gets  them all in the morning.   Otherwise, there have been no changes to her past medical history, surgical history, family history, or social history.    Review of Systems  Constitutional: Negative.  Negative for chills, fever, malaise/fatigue and weight loss.  HENT: Negative.  Negative for congestion, ear discharge, ear pain and sinus pain.   Eyes:  Negative for pain, discharge and redness.  Respiratory:  Positive for cough and shortness of breath. Negative for sputum production and wheezing.   Cardiovascular: Negative.  Negative for chest pain and palpitations.  Gastrointestinal:  Negative for abdominal pain, constipation, diarrhea, heartburn, nausea and vomiting.  Skin: Negative.  Negative for itching and rash.  Neurological:  Negative for dizziness and headaches.  Endo/Heme/Allergies:  Negative for environmental allergies. Does not bruise/bleed easily.      Objective:   Blood pressure 104/70, pulse (!) 114, temperature 97.6 F (36.4 C), temperature source Temporal, resp. rate 16, height 5\' 2"  (1.575 m), weight 135 lb (61.2 kg), last menstrual period 07/13/2021, SpO2 97 %, not currently breastfeeding. Body mass index is 24.69 kg/m.   Physical Exam Vitals reviewed.  Constitutional:      Appearance: She is well-developed. She is ill-appearing.     Comments: Able to speak in full sentences.   HENT:     Head: Normocephalic and atraumatic.     Right Ear: Tympanic membrane, ear canal and external ear normal.     Left Ear: Tympanic membrane, ear canal and external ear normal.     Nose: No nasal deformity, septal deviation, mucosal edema or rhinorrhea.     Right Turbinates: Enlarged, swollen and pale.     Left Turbinates: Enlarged, swollen and pale.     Right Sinus: No maxillary sinus tenderness or frontal sinus tenderness.     Left Sinus: No maxillary sinus tenderness or frontal sinus tenderness.     Mouth/Throat:     Mouth: Mucous membranes are not pale and not dry.     Pharynx:  Uvula midline.  Eyes:     General: Lids are normal. No allergic shiner.       Right eye: No discharge.        Left eye: No discharge.     Conjunctiva/sclera: Conjunctivae normal.     Right eye: Right conjunctiva is not injected. No chemosis.    Left eye: Left conjunctiva is not injected. No chemosis.    Pupils: Pupils are equal, round, and reactive to light.  Cardiovascular:     Rate and Rhythm: Normal rate and regular rhythm.     Heart sounds: Normal heart sounds.  Pulmonary:     Effort: Pulmonary effort is normal. No tachypnea, accessory muscle usage or respiratory distress.     Breath sounds: Normal breath sounds. No wheezing, rhonchi or rales.     Comments: Moving air well in all lung fields. There is some wheezing appreciated in the bases. No crackles, but there are some coarse rhonchi in the posterior fields.  Chest:  Chest wall: No tenderness.  Lymphadenopathy:     Cervical: No cervical adenopathy.  Skin:    Coloration: Skin is not pale.     Findings: No abrasion, erythema, petechiae or rash. Rash is not papular, urticarial or vesicular.  Neurological:     Mental Status: She is alert.  Psychiatric:        Behavior: Behavior is cooperative.     Diagnostic studies: DepoMedrol injection given in clinic today      Malachi BondsJoel Tyreece Gelles, MD  [facility]

## 2021-07-26 NOTE — Patient Instructions (Addendum)
Asthma exacerbation with pneumonia - Complete the course of antibiotics that were prescribed by the Doctor at the ED. - I would definitely start the Pulmicort prescribed by the Nurse Practitioner Thurston Hole. - I would continue the Pulmicort twice daily for two weeks at least (can stop as long as you are feeling better).  - Complete the steroids that were prescribed by the Nurse Practitioner Thurston Hole. - Prescription written for promethazine cough syrup. - Be sure to go home and take care of your self.   2. Return in about 2 months (around 09/23/2021).    Please inform us of any Emergency Department visits, hospitalizations, or changes in symptoms. Call us before going to the ED for breathing or allergy symptoms since we might be able to fit you in for a sick visit. Feel free to contact us anytime with any questions, problems, or concerns.  It was a pleasure to see you again today!  Websites that have reliable patient information: 1. American Academy of Asthma, Allergy, and Immunology: www.aaaai.org 2. Food Allergy Research and Education (FARE): foodallergy.org 3. Mothers of Asthmatics: http://www.asthmacommunitynetwork.org 4. American College of Allergy, Asthma, and Immunology: www.acaai.org   COVID-19 Vaccine Information can be found at: [email or web address].[email or web address] For questions related to vaccine distribution or appointments, please email [email or web address] or call [phone].   We realize that you might be concerned about having an allergic reaction to the COVID19 vaccines. To help with that concern, WE ARE OFFERING THE COVID19 VACCINES IN OUR OFFICE! Ask the front desk for dates!     Like [place] on [employer] and Instagram for our latest updates!      A healthy democracy works best when [employer] participate! Make sure you are registered to vote! If you have moved or changed any of your contact information, you will need to get this updated  before voting!  In some cases, you MAY be able to register to vote online: [email or web address]

## 2021-08-03 ENCOUNTER — Other Ambulatory Visit: Payer: Self-pay | Admitting: Allergy & Immunology

## 2021-08-25 ENCOUNTER — Other Ambulatory Visit: Payer: Self-pay | Admitting: Allergy & Immunology

## 2021-09-27 ENCOUNTER — Ambulatory Visit: Payer: 59 | Admitting: Allergy & Immunology

## 2021-09-27 ENCOUNTER — Other Ambulatory Visit: Payer: Self-pay | Admitting: Allergy & Immunology

## 2021-10-25 ENCOUNTER — Ambulatory Visit: Payer: 59 | Admitting: Allergy & Immunology

## 2021-10-25 ENCOUNTER — Encounter: Payer: Self-pay | Admitting: Allergy & Immunology

## 2021-10-25 ENCOUNTER — Other Ambulatory Visit: Payer: Self-pay

## 2021-10-25 VITALS — BP 108/66 | HR 92 | Temp 97.9°F | Resp 18

## 2021-10-25 DIAGNOSIS — K219 Gastro-esophageal reflux disease without esophagitis: Secondary | ICD-10-CM | POA: Diagnosis not present

## 2021-10-25 DIAGNOSIS — J4520 Mild intermittent asthma, uncomplicated: Secondary | ICD-10-CM | POA: Diagnosis not present

## 2021-10-25 DIAGNOSIS — J4531 Mild persistent asthma with (acute) exacerbation: Secondary | ICD-10-CM

## 2021-10-25 DIAGNOSIS — J3089 Other allergic rhinitis: Secondary | ICD-10-CM

## 2021-10-25 MED ORDER — BUDESONIDE-FORMOTEROL FUMARATE 160-4.5 MCG/ACT IN AERO: 2.0000 | INHALATION_SPRAY | Freq: Two times a day (BID) | RESPIRATORY_TRACT | 5 refills | Status: DC

## 2021-10-25 NOTE — Patient Instructions (Addendum)
1. Mild intermittent asthma, uncomplicated ?- Lung testing looked amazing today (one of the numbers was even BETTER than September 2022).  ?- We are going to start Symbicort (contains a long acting albuterol with an inhaled steroid). ?- I want to see if this allows you to do more cardio and other physically activities.  ?- Daily controller medication(s): Symbicort 160/4.5 two puffs twice daily with spacer ?- Prior to physical activity: albuterol 2 puffs 10-15 minutes before physical activity. ?- Rescue medications: albuterol 4 puffs every 4-6 hours as needed ?- Changes during respiratory infections or worsening symptoms: Add on Flovent 2 puffs twice daily OR Pulmicort neb one treatment twice daily for 1-2 weeks.  ?- Asthma control goals:  ?* Full participation in all desired activities (may need albuterol before activity) ?* Albuterol use two time or less a week on average (not counting use with activity) ?* Cough interfering with sleep two time or less a month ?* Oral steroids no more than once a year ?* No hospitalizations ? ?2. Allergic rhinitis ?- Continue with Allegra one tablet once daily as needed. ?- Continue with Flonase one spray per nostril daily as needed.  ? ?3. Return in about 3 months (around 01/25/2022).  ? ? ?Please inform us of any Emergency Department visits, hospitalizations, or changes in symptoms. Call us before going to the ED for breathing or allergy symptoms since we might be able to fit you in for a sick visit. Feel free to contact us anytime with any questions, problems, or concerns. ? ?It was a pleasure to see you again today!  ? ?Websites that have reliable patient information: ?1. American Academy of Asthma, Allergy, and Immunology: www.aaaai.org ?2. Food Allergy Research and Education (FARE): foodallergy.org ?3. Mothers of Asthmatics: http://www.asthmacommunitynetwork.org ?4. American College of Allergy, Asthma, and Immunology: MissingWeapons.ca ? ??Like? [place] on Facebook and Instagram  for our latest updates!  ?  ? ? ? ?Make sure you are registered to vote! If you have moved or changed any of your contact information, you will need to get this updated before voting! ? ?In some cases, you MAY be able to register to vote online: [email or web address] ? ? ? ? ? ?

## 2021-10-25 NOTE — Progress Notes (Signed)
? ?FOLLOW UP ? ?Date of Service/Encounter:  10/25/21 ? ? ?Assessment:  ? ?Mild to moderate persistent asthma - adding on ICS/LABA today ? ?Allergic rhinitis - s/p allergen immunotherapy x 2  ?  ?Recent pneumonia in the fall 2022 with prolonged course of coughing and now with continued shortness of breath especially with physical activity ?  ?Member of [employer] ?  ? ?Plan/Recommendations:  ? ?1. Mild intermittent asthma, uncomplicated ?- Lung testing looked amazing today (one of the numbers was even BETTER than September 2022).  ?- We are going to start Symbicort (contains a long acting albuterol with an inhaled steroid). ?- I want to see if this allows you to do more cardio and other physically activities.  ?- Daily controller medication(s): Symbicort 160/4.5 two puffs twice daily with spacer ?- Prior to physical activity: albuterol 2 puffs 10-15 minutes before physical activity. ?- Rescue medications: albuterol 4 puffs every 4-6 hours as needed ?- Changes during respiratory infections or worsening symptoms: Add on Flovent 2 puffs twice daily OR Pulmicort neb one treatment twice daily for 1-2 weeks.  ?- Asthma control goals:  ?* Full participation in all desired activities (may need albuterol before activity) ?* Albuterol use two time or less a week on average (not counting use with activity) ?* Cough interfering with sleep two time or less a month ?* Oral steroids no more than once a year ?* No hospitalizations ? ?2. Allergic rhinitis ?- Continue with Allegra one tablet once daily as needed. ?- Continue with Flonase one spray per nostril daily as needed.  ? ?3. Return in about 3 months (around 01/25/2022).  ? ? ?Subjective:  ? ?Mariah [name] is a 36 y.o. female presenting today for follow up of  ?Chief Complaint  ?Patient presents with  ? Asthma  ?  Been doing okay  ? ? ?Mariah [name] has a history of the following: ?Patient Active Problem List  ? Diagnosis Date Noted  ? Mild persistent asthma with  acute exacerbation 07/24/2021  ? Acute non-recurrent maxillary sinusitis 07/24/2021  ? Gastroesophageal reflux disease 07/24/2021  ? Normal labor 08/07/2020  ? Pruritus of vagina 07/01/2019  ? URI (upper respiratory infection) 01/28/2017  ? Missed period 01/28/2017  ? Encounter to establish care 05/04/2015  ? Motion sickness 05/04/2015  ? Chronic urticaria 02/25/2015  ? Mild intermittent asthma 02/25/2015  ? Rash and nonspecific skin eruption 09/21/2012  ? Left knee pain 08/02/2011  ? Contraception management 05/13/2011  ? PAP SMEAR, ABNORMAL 04/27/2009  ? SYNCOPE 02/08/2009  ? Other allergic rhinitis 12/21/2008  ? Asthma, chronic 08/18/2007  ? Generalized anxiety disorder 03/11/2007  ? PANIC ATTACK 03/11/2007  ? ? ?History obtained from: chart review and patient. ? ?Mariah [name] is a 36 y.o. female presenting for a follow up visit.  She was last seen by me in February 2020.  At that time, we recommended completing antibiotics that were already prescribed and finishing up the prednisone that was previously prescribed.  We also continued the Pulmicort twice daily for 2 weeks at least.  We gave her some promethazine cough syrup. ? ?Since last visit, she has done very well. ? ?Asthma/Respiratory Symptom History: She has Flovent 110 mcg 1 puff twice daily to during the winter only.  She does get winded with brisk walking. She cannot do cardio and thinks that this might be in her head. She is wondering what it might be like to NOT have asthma.  She has never been on an every  day controller medication.  She has never been on a combined ICS/LABA.  She is open to trying some new options to try to lead a more normal life.  She just does not feel like she has gotten back to 100%. ? ?Allergic Rhinitis Symptom History: She remains on Allegra 1 tablet daily as well as Flonase.  She has undergone 2 rounds of allergen immunotherapy.  Allergies are under good control. ? ?Otherwise, there have been no changes to her past medical history,  surgical history, family history, or social history. ? ? ? ?Review of Systems  ?Constitutional: Negative.  Negative for chills, fever, malaise/fatigue and weight loss.  ?HENT: Negative.  Negative for congestion, ear discharge, ear pain and sinus pain.   ?Eyes:  Negative for pain, discharge and redness.  ?Respiratory:  Positive for cough and shortness of breath. Negative for sputum production and wheezing.   ?Cardiovascular: Negative.  Negative for chest pain and palpitations.  ?Gastrointestinal:  Negative for abdominal pain, constipation, diarrhea, heartburn, nausea and vomiting.  ?Skin: Negative.  Negative for itching and rash.  ?Neurological:  Negative for dizziness and headaches.  ?Endo/Heme/Allergies:  Negative for environmental allergies. Does not bruise/bleed easily.   ? ? ? ?Objective:  ? ?Blood pressure 108/66, pulse 92, temperature 97.9 ?F (36.6 ?C), resp. rate 18, SpO2 97 %, not currently breastfeeding. ?There is no height or weight on file to calculate BMI. ? ? ? ?Physical Exam ?Vitals reviewed.  ?Constitutional:   ?   Appearance: She is well-developed.  ?   Comments: Talkative.  ?HENT:  ?   Head: Normocephalic and atraumatic.  ?   Right Ear: Tympanic membrane, ear canal and external ear normal.  ?   Left Ear: Tympanic membrane, ear canal and external ear normal.  ?   Nose: No nasal deformity, septal deviation, mucosal edema or rhinorrhea.  ?   Right Turbinates: Enlarged and swollen.  ?   Left Turbinates: Enlarged and swollen.  ?   Right Sinus: No maxillary sinus tenderness or frontal sinus tenderness.  ?   Left Sinus: No maxillary sinus tenderness or frontal sinus tenderness.  ?   Mouth/Throat:  ?   Mouth: Mucous membranes are not pale and not dry.  ?   Pharynx: Uvula midline.  ?Eyes:  ?   General: Lids are normal. No allergic shiner.    ?   Right eye: No discharge.     ?   Left eye: No discharge.  ?   Conjunctiva/sclera: Conjunctivae normal.  ?   Right eye: Right conjunctiva is not injected. No  chemosis. ?   Left eye: Left conjunctiva is not injected. No chemosis. ?   Pupils: Pupils are equal, round, and reactive to light.  ?Cardiovascular:  ?   Rate and Rhythm: Normal rate and regular rhythm.  ?   Heart sounds: Normal heart sounds.  ?Pulmonary:  ?   Effort: Pulmonary effort is normal. No tachypnea, accessory muscle usage or respiratory distress.  ?   Breath sounds: Normal breath sounds. No wheezing, rhonchi or rales.  ?   Comments: Moving air well in all lung fields.  No increased work of breathing. ?Chest:  ?   Chest wall: No tenderness.  ?Lymphadenopathy:  ?   Cervical: No cervical adenopathy.  ?Skin: ?   General: Skin is warm.  ?   Capillary Refill: Capillary refill takes less than 2 seconds.  ?   Coloration: Skin is not pale.  ?   Findings: No abrasion, erythema, petechiae  or rash. Rash is not papular, urticarial or vesicular.  ?   Comments: No eczematous or urticarial lesions noted.  ?Neurological:  ?   Mental Status: She is alert.  ?Psychiatric:     ?   Behavior: Behavior is cooperative.  ?  ? ?Diagnostic studies:   ? ?Spirometry: results normal (FEV1: 2.50/85%, FVC: 3.04/87%, FEV1/FVC: 82%).  ?  ?Spirometry consistent with normal pattern.  ? ? ?Allergy Studies: none ? ? ? ? ? ?  ?Malachi BondsJoel Grainne Knights, MD  ?[facility] ? ? ? ? ? ? ?

## 2021-10-26 ENCOUNTER — Encounter: Payer: Self-pay | Admitting: Allergy & Immunology

## 2021-10-26 ENCOUNTER — Other Ambulatory Visit: Payer: Self-pay | Admitting: Allergy & Immunology

## 2021-12-18 ENCOUNTER — Other Ambulatory Visit: Payer: Self-pay

## 2021-12-18 MED ORDER — SYMBICORT 160-4.5 MCG/ACT IN AERO: 2.0000 | INHALATION_SPRAY | Freq: Two times a day (BID) | RESPIRATORY_TRACT | 5 refills | Status: DC

## 2021-12-30 ENCOUNTER — Encounter: Payer: Self-pay | Admitting: Emergency Medicine

## 2021-12-30 ENCOUNTER — Ambulatory Visit
Admission: EM | Admit: 2021-12-30 | Discharge: 2021-12-30 | Disposition: A | Discharge status: Home / Self Care | Payer: 59 | Attending: Nurse Practitioner | Admitting: Nurse Practitioner

## 2021-12-30 DIAGNOSIS — H109 Unspecified conjunctivitis: Secondary | ICD-10-CM | POA: Diagnosis not present

## 2021-12-30 DIAGNOSIS — B9689 Other specified bacterial agents as the cause of diseases classified elsewhere: Secondary | ICD-10-CM | POA: Diagnosis not present

## 2021-12-30 MED ORDER — POLYMYXIN B-TRIMETHOPRIM 10000-0.1 UNIT/ML-% OP SOLN: 1.0000 [drp] | Freq: Four times a day (QID) | OPHTHALMIC | 0 refills | Status: AC

## 2021-12-30 NOTE — ED Triage Notes (Signed)
Bilateral ey redness with pus drainage since yesterday.

## 2021-12-30 NOTE — ED Provider Notes (Signed)
[facility]    CSN: [id number] Arrival date & time: 12/30/21  4799      History   Chief Complaint No chief complaint on file.   HPI Mariah [name] is a 36 y.o. female.   The history is provided by the patient.   Patient presents with bilateral eye redness and irritation with drainage that started 1 day ago.  Patient states she noticed itching in her eyes on yesterday.  She states when she woke up this morning, her eyes were matted shut with pus and a lot of "discharge".  She complains of swelling to the eyelids and discomfort of both eyes.  She denies fever, chills, blurred vision, change in vision, loss of vision, headache, or upper respiratory symptoms.  States that she most likely got this from her daughter who attends daycare.  States that she applied cool cloths to the eyes to help with discomfort.  States she does wear contacts and glasses.  She has been wearing her glasses since her symptoms started.  Past Medical History:  Diagnosis Date   Allergy    Anxiety state, unspecified    Asthma    Panic disorder without agoraphobia    Urticaria     Patient Active Problem List   Diagnosis Date Noted   Mild persistent asthma with acute exacerbation 07/24/2021   Acute non-recurrent maxillary sinusitis 07/24/2021   Gastroesophageal reflux disease 07/24/2021   Normal labor 08/07/2020   Pruritus of vagina 07/01/2019   URI (upper respiratory infection) 01/28/2017   Missed period 01/28/2017   Encounter to establish care 05/04/2015   Motion sickness 05/04/2015   Chronic urticaria 02/25/2015   Mild intermittent asthma 02/25/2015   Rash and nonspecific skin eruption 09/21/2012   Left knee pain 08/02/2011   Contraception management 05/13/2011   PAP SMEAR, ABNORMAL 04/27/2009   SYNCOPE 02/08/2009   Other allergic rhinitis 12/21/2008   Asthma, chronic 08/18/2007   Generalized anxiety disorder 03/11/2007   PANIC ATTACK 03/11/2007    Past Surgical History:  Procedure  Laterality Date   TONSILLECTOMY     01/26/07- Dr. Jenne Pane   WISDOM TOOTH EXTRACTION      OB History     Gravida  1   Para  1   Term  1   Preterm  0   AB  0   Living  1      SAB  0   IAB  0   Ectopic  0   Multiple  0   Live Births  1            Home Medications    Prior to Admission medications   Medication Sig Start Date End Date Taking? Authorizing Provider  trimethoprim-polymyxin b (POLYTRIM) ophthalmic solution Place 1 drop into both eyes every 6 (six) hours for 7 days. 12/30/21 01/06/22 Yes Nochum Fenter-Warren, Sadie Haber, NP  acetaminophen (TYLENOL) 500 MG tablet Take 500 mg by mouth every 6 (six) hours as needed for mild pain or headache.    [provider]  albuterol (PROVENTIL) (2.5 MG/3ML) 0.083% nebulizer solution Take 3 mLs (2.5 mg total) by nebulization every 4 (four) hours as needed for wheezing or shortness of breath. 07/24/21   Hetty Blend, FNP  albuterol (VENTOLIN HFA) 108 (90 Base) MCG/ACT inhaler inhale 2 PUFFS into THE lungs every 4 hours as needed FOR WHEEZING OR SHORTNESS OF BREATH 10/26/21   Alfonse Spruce, MD  budesonide (PULMICORT) 0.5 MG/2ML nebulizer solution One vial in nebulizer twice a  day to prevent coughing or wheezing. 07/24/21   Dara Hoyer, FNP  budesonide-formoterol (SYMBICORT) 160-4.5 MCG/ACT inhaler Inhale 2 puffs into the lungs in the morning and at bedtime. 10/25/21 11/24/21  Valentina Shaggy, MD  cetirizine (ZYRTEC) 10 MG tablet Take 10 mg by mouth daily as needed for allergies.    [provider]  clindamycin (CLEOCIN T) 1 % external solution clindamycin phosphate 1 % topical solution  APPLY TO AFFECTED AREA ON SKIN 2 TIMES DAILY    [provider]  drospirenone-ethinyl estradiol (YAZ) 3-0.02 MG tablet Take 1 tablet by mouth daily. 06/26/21   [provider]  escitalopram (LEXAPRO) 10 MG tablet escitalopram 10 mg tablet  TAKE 1 TABLET 1tpo EVERY DAY    [provider]  fluticasone  (FLONASE) 50 MCG/ACT nasal spray Place 2 sprays into both nostrils daily. Please dispense generic. 02/27/21   Valentina Shaggy, MD  SYMBICORT 160-4.5 MCG/ACT inhaler Inhale 2 puffs into the lungs 2 (two) times daily. 12/18/21   Valentina Shaggy, MD  tretinoin (RETIN-A) 0.025 % cream tretinoin 0.025 % topical cream  APPLY TO AFFECTED AREA ON FACE EVERY EVENING    [provider]  fexofenadine (ALLEGRA) 180 MG tablet Take 180 mg by mouth daily. Reported on 08/18/2015  09/30/19  [provider]    Family History Family History  Problem Relation Age of Onset   Hypertension Father    Drug abuse Father    Arthritis Father    Crohn's disease Father    Depression Mother    Diabetes Other    Cancer Other    Stroke Other    Congestive Heart Failure Paternal Grandmother     Social History Social History   Tobacco Use   Smoking status: Never   Smokeless tobacco: Never  Vaping Use   Vaping Use: Never used  Substance Use Topics   Alcohol use: Yes    Alcohol/week: 0.0 standard drinks of alcohol    Comment: very rarely   Drug use: No     Allergies   Codeine and Vicodin [hydrocodone-acetaminophen]   Review of Systems Review of Systems Per HPI  Physical Exam Triage Vital Signs ED Triage Vitals  Enc Vitals Group     BP 12/30/21 0932 121/77     Pulse Rate 12/30/21 0932 (!) 101     Resp 12/30/21 0932 18     Temp 12/30/21 0932 98.8 F (37.1 C)     Temp Source 12/30/21 0932 Oral     SpO2 12/30/21 0932 98 %     Weight --      Height --      Head Circumference --      Peak Flow --      Pain Score 12/30/21 0933 3     Pain Loc --      Pain Edu? --      Excl. in [place]? --    No data found.  Updated Vital Signs BP 121/77 (BP Location: Right Arm)   Pulse (!) 101   Temp 98.8 F (37.1 C) (Oral)   Resp 18   SpO2 98%   Visual Acuity Right Eye Distance:   Left Eye Distance:   Bilateral Distance:    Right Eye Near:   Left Eye Near:    Bilateral Near:      Physical Exam Vitals and nursing note reviewed.  Constitutional:      Appearance: Normal appearance.  HENT:     Head: Normocephalic.  Right Ear: Tympanic membrane, ear canal and external ear normal.     Left Ear: Tympanic membrane, ear canal and external ear normal.     Nose: Nose normal.     Mouth/Throat:     Mouth: Mucous membranes are moist.  Eyes:     General: Vision grossly intact. No visual field deficit.       Right eye: Discharge present. No foreign body or hordeolum.        Left eye: Discharge present.No foreign body or hordeolum.     Extraocular Movements: Extraocular movements intact.     Right eye: Normal extraocular motion and no nystagmus.     Left eye: Normal extraocular motion and no nystagmus.     Conjunctiva/sclera:     Right eye: Right conjunctiva is injected.     Left eye: Left conjunctiva is injected.     Pupils: Pupils are equal, round, and reactive to light.  Cardiovascular:     Rate and Rhythm: Normal rate and regular rhythm.     Pulses: Normal pulses.     Heart sounds: Normal heart sounds.  Pulmonary:     Effort: Pulmonary effort is normal.     Breath sounds: Normal breath sounds.  Abdominal:     General: Bowel sounds are normal.     Palpations: Abdomen is soft.  Musculoskeletal:     Cervical back: Normal range of motion.  Skin:    General: Skin is warm and dry.  Neurological:     General: No focal deficit present.     Mental Status: She is alert and oriented to person, place, and time.  Psychiatric:        Mood and Affect: Mood normal.        Behavior: Behavior normal.      UC Treatments / Results  Labs (all labs ordered are listed, but only abnormal results are displayed) Labs Reviewed - No data to display  EKG   Radiology No results found.  Procedures Procedures (including critical care time)  Medications Ordered in UC Medications - No data to display  Initial Impression / Assessment and Plan / UC Course  I have  reviewed the triage vital signs and the nursing notes.  Pertinent labs & imaging results that were available during my care of the patient were reviewed by me and considered in my medical decision making (see chart for details).  Patient presents for complaints of bilateral eye redness with irritation and discharge.  Patient states that she woke up this morning with those symptoms.  On exam, patient has mucopurulent discharge in both eyes.  Injection of conjunctiva bilaterally are noted.  Swelling is noted to the upper eyelids.  We will treat patient for bacterial conjunctivitis.  Supportive care recommendations were provided to the patient.  Patient was given strict indications of when to go to the emergency room or follow-up with her eye doctor to include sudden loss of vision, change in vision, or decreased vision.  Patient advised to follow-up as needed. Final Clinical Impressions(s) / UC Diagnoses   Final diagnoses:  Bacterial conjunctivitis of both eyes     Discharge Instructions      Use eyedrops as prescribed.   Cool compresses to the eyes to help with pain or swelling. May continue the over-the-counter eyedrops you are using to help keep the eyes moist and decreased redness. Strict handwashing when applying medication.  Avoid rubbing or manipulating the eyes while symptoms persist. Discard any eye make-up that were using  prior to your symptoms starting. Discard contacts you are wearing prior to the onset of your symptoms. Go to the emergency department or follow-up with your eye doctor immediately for sudden loss of vision, change in vision, or decreased vision. Follow-up if symptoms do not improve.      ED Prescriptions     Medication Sig Dispense Auth. Provider   trimethoprim-polymyxin b (POLYTRIM) ophthalmic solution Place 1 drop into both eyes every 6 (six) hours for 7 days. 10 mL Aralyn Nowak-Warren, Sadie Haber, NP      PDMP not reviewed this encounter.   Abran Cantor, NP 12/30/21 [phone]

## 2021-12-30 NOTE — Discharge Instructions (Addendum)
Use eyedrops as prescribed.   Cool compresses to the eyes to help with pain or swelling. May continue the over-the-counter eyedrops you are using to help keep the eyes moist and decreased redness. Strict handwashing when applying medication.  Avoid rubbing or manipulating the eyes while symptoms persist. Discard any eye make-up that were using prior to your symptoms starting. Discard contacts you are wearing prior to the onset of your symptoms. Go to the emergency department or follow-up with your eye doctor immediately for sudden loss of vision, change in vision, or decreased vision. Follow-up if symptoms do not improve.

## 2022-01-29 ENCOUNTER — Ambulatory Visit: Payer: 59 | Admitting: Allergy & Immunology

## 2022-02-21 ENCOUNTER — Encounter: Payer: Self-pay | Admitting: Allergy & Immunology

## 2022-02-21 ENCOUNTER — Ambulatory Visit: Payer: 59 | Admitting: Allergy & Immunology

## 2022-02-21 VITALS — BP 100/68 | HR 89 | Temp 97.8°F | Resp 16

## 2022-02-21 DIAGNOSIS — K219 Gastro-esophageal reflux disease without esophagitis: Secondary | ICD-10-CM | POA: Diagnosis not present

## 2022-02-21 DIAGNOSIS — J3089 Other allergic rhinitis: Secondary | ICD-10-CM | POA: Diagnosis not present

## 2022-02-21 DIAGNOSIS — J4531 Mild persistent asthma with (acute) exacerbation: Secondary | ICD-10-CM

## 2022-02-21 MED ORDER — MONTELUKAST SODIUM 10 MG PO TABS: 10.0000 mg | ORAL_TABLET | Freq: Every day | ORAL | 5 refills | Status: DC

## 2022-02-21 NOTE — Progress Notes (Signed)
FOLLOW UP  Date of Service/Encounter:  02/21/22   Assessment:   Mild to moderate persistent asthma - adding on ICS/LABA today   Allergic rhinitis - s/p allergen immunotherapy x 2    Recent pneumonia in the fall 2022 with prolonged course of coughing and now with continued shortness of breath especially with physical activity   Member of [facility]    Plan/Recommendations:   1. Mild intermittent asthma, uncomplicated - Lung testing looked amazing today. - I agree with the plan to stop the Symbicort.  - We can start Singulair to see if this works for you.  - Daily controller medication(s): Singulair 10mg  daily - Prior to physical activity: albuterol 2 puffs 10-15 minutes before physical activity. - Rescue medications: albuterol 4 puffs every 4-6 hours as needed - Changes during respiratory infections or worsening symptoms: Add on Symbicort two puffs twice daily OR Pulmicort nebulizer treatments twice daily for ONE TO TWO WEEKS during flares - Asthma control goals:  * Full participation in all desired activities (may need albuterol before activity) * Albuterol use two time or less a week on average (not counting use with activity) * Cough interfering with sleep two time or less a month * Oral steroids no more than once a year * No hospitalizations  2. Allergic rhinitis - Continue with Allegra one tablet once daily as needed. - Continue with Flonase one spray per nostril daily as needed.   3. Return in about 6 months (around 08/22/2022).   Subjective:   Mariah [name] is a 36 y.o. female presenting today for follow up of  Chief Complaint  Patient presents with   Follow-up    Mariah ARMIJO has a history of the following: Patient Active Problem List   Diagnosis Date Noted   Mild persistent asthma with acute exacerbation 07/24/2021   Acute non-recurrent maxillary sinusitis 07/24/2021   Gastroesophageal reflux disease 07/24/2021   Normal labor 08/07/2020    Pruritus of vagina 07/01/2019   URI (upper respiratory infection) 01/28/2017   Missed period 01/28/2017   Encounter to establish care 05/04/2015   Motion sickness 05/04/2015   Chronic urticaria 02/25/2015   Mild intermittent asthma 02/25/2015   Rash and nonspecific skin eruption 09/21/2012   Left knee pain 08/02/2011   Contraception management 05/13/2011   PAP SMEAR, ABNORMAL 04/27/2009   SYNCOPE 02/08/2009   Other allergic rhinitis 12/21/2008   Asthma, chronic 08/18/2007   Generalized anxiety disorder 03/11/2007   PANIC ATTACK 03/11/2007    History obtained from: chart review and patient.  Mariah [name] is a 36 y.o. female presenting for a follow up visit.  She was last seen in May 2023.  At that time, her lung testing looked amazing today.  We decided to start Symbicort.  I was hoping that this would allow her to do more cardio activity.  We started Symbicort 160 mcg 2 puffs twice daily and albuterol as needed.  She has Flovent that she has during flares.  For her rhinitis, we continue with Allegra and Flonase.  Since last visit, she has done well.   Asthma/Respiratory Symptom History: She does not like the Symbicort because it is hard to work into a routine. She is gradually improving symptom wise. She had the RSV pneumonia in early 2023.  She is still not 100%. She was on Singulair years ago when she was seeing Dr. 2024. She knows of the side effects of the Singulair and is willing to go ahead and try it again.  She does not remember if it worked when she was on that years ago.  She is just have a hard time getting Symbicort into her routine.  Allergic Rhinitis Symptom History: She remains on Allegra and Flonase for her allergies.  This combination seems to be working well.  She has not been on antibiotics.  She tends to only get sick during the December to March timeframe.  Her 54-month-old daughter is doing very well.  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   Otherwise, there have been no changes to her past medical history, surgical history, family history, or social history.    Review of Systems  Constitutional: Negative.  Negative for chills, fever, malaise/fatigue and weight loss.  HENT: Negative.  Negative for congestion, ear discharge, ear pain and sinus pain.   Eyes:  Negative for pain, discharge and redness.  Respiratory:  Negative for cough, sputum production, shortness of breath, wheezing and stridor.   Cardiovascular: Negative.  Negative for chest pain and palpitations.  Gastrointestinal:  Negative for abdominal pain, constipation, diarrhea, heartburn, nausea and vomiting.  Skin: Negative.  Negative for itching and rash.  Neurological:  Negative for dizziness and headaches.  Endo/Heme/Allergies:  Negative for environmental allergies. Does not bruise/bleed easily.       Objective:   Blood pressure 100/68, pulse 89, temperature 97.8 F (36.6 C), resp. rate 16, SpO2 97 %, not currently breastfeeding. There is no height or weight on file to calculate BMI.    Physical Exam Vitals reviewed.  Constitutional:      Appearance: She is well-developed.     Comments: Talkative.  HENT:     Head: Normocephalic and atraumatic.     Right Ear: Tympanic membrane, ear canal and external ear normal.     Left Ear: Tympanic membrane, ear canal and external ear normal.     Nose: No nasal deformity, septal deviation, mucosal edema or rhinorrhea.     Right Turbinates: Enlarged, swollen and pale.     Left Turbinates: Enlarged, swollen and pale.     Right Sinus: No maxillary sinus tenderness or frontal sinus tenderness.     Left Sinus: No maxillary sinus tenderness or frontal sinus tenderness.     Mouth/Throat:     Mouth: Mucous membranes are not pale and not dry.     Pharynx: Uvula midline.     Comments: Cobblestoning present in the posterior oropharynx.  Eyes:     General: Lids are normal. Allergic shiner present.         Right eye: No discharge.        Left eye: No discharge.     Conjunctiva/sclera: Conjunctivae normal.     Right eye: Right conjunctiva is not injected. No chemosis.    Left eye: Left conjunctiva is not injected. No chemosis.    Pupils: Pupils are equal, round, and reactive to light.  Cardiovascular:     Rate and Rhythm: Normal rate and regular rhythm.     Heart sounds: Normal heart sounds.  Pulmonary:     Effort: Pulmonary effort is normal. No tachypnea, accessory muscle usage or respiratory distress.     Breath sounds: Normal breath sounds. No wheezing, rhonchi or rales.     Comments: Moving air well in all lung fields.  No increased work of breathing. Chest:     Chest wall: No tenderness.  Lymphadenopathy:     Cervical: No cervical adenopathy.  Skin:    General: Skin  is warm.     Capillary Refill: Capillary refill takes less than 2 seconds.     Coloration: Skin is not pale.     Findings: No abrasion, erythema, petechiae or rash. Rash is not papular, urticarial or vesicular.     Comments: No eczematous or urticarial lesions noted.  Neurological:     Mental Status: She is alert.  Psychiatric:        Behavior: Behavior is cooperative.      Diagnostic studies:    Spirometry: results normal (FEV1: 2.58/89%, FVC: 3.08/89%, FEV1/FVC: 84%).   Spirometry consistent with normal pattern.   Allergy Studies: none        Malachi Bonds, MD  [facility]

## 2022-02-21 NOTE — Patient Instructions (Addendum)
1. Mild intermittent asthma, uncomplicated - Lung testing looked amazing today. - I agree with the plan to stop the Symbicort.  - We can start Singulair to see if this works for you.  - Daily controller medication(s): Singulair 10mg  daily - Prior to physical activity: albuterol 2 puffs 10-15 minutes before physical activity. - Rescue medications: albuterol 4 puffs every 4-6 hours as needed - Changes during respiratory infections or worsening symptoms: Add on Symbicort two puffs twice daily OR Pulmicort nebulizer treatments twice daily for ONE TO TWO WEEKS during flares - Asthma control goals:  * Full participation in all desired activities (may need albuterol before activity) * Albuterol use two time or less a week on average (not counting use with activity) * Cough interfering with sleep two time or less a month * Oral steroids no more than once a year * No hospitalizations  2. Allergic rhinitis - Continue with Allegra one tablet once daily as needed. - Continue with Flonase one spray per nostril daily as needed.   3. Return in about 6 months (around 08/22/2022).    Please inform 08/24/2022 of any Emergency Department visits, hospitalizations, or changes in symptoms. Call us before going to the ED for breathing or allergy symptoms since we might be able to fit you in for a sick visit. Feel free to contact us anytime with any questions, problems, or concerns.  It was a pleasure to see you again today!   Websites that have reliable patient information: 1. American Academy of Asthma, Allergy, and Immunology: www.aaaai.org 2. Food Allergy Research and Education (FARE): foodallergy.org 3. Mothers of Asthmatics: http://www.asthmacommunitynetwork.org 4. American College of Allergy, Asthma, and Immunology: www.acaai.org  "Like" [place] on Facebook and Instagram for our latest updates!       Make sure you are registered to vote! If you have moved or changed any of your contact information, you  will need to get this updated before voting!  In some cases, you MAY be able to register to vote online: [place]

## 2022-02-26 ENCOUNTER — Other Ambulatory Visit: Payer: Self-pay | Admitting: Allergy & Immunology

## 2022-02-27 ENCOUNTER — Other Ambulatory Visit: Payer: Self-pay | Admitting: Allergy & Immunology

## 2022-03-06 ENCOUNTER — Telehealth: Payer: Self-pay | Admitting: Allergy & Immunology

## 2022-03-06 DIAGNOSIS — Z0000 Encounter for general adult medical examination without abnormal findings: Secondary | ICD-10-CM

## 2022-03-06 NOTE — Telephone Encounter (Signed)
We are going to be getting the RSV vaccine. Date TBD. However, it is only approved for 60 and above right now.   We are not going to be carrying the Prevnar 20. She should definitely be that as well.   Malachi Bonds, MD [facility]

## 2022-03-06 NOTE — Telephone Encounter (Signed)
Patient called and said that she needs to get a rsv and pneumonia shot. [phone]

## 2022-03-06 NOTE — Telephone Encounter (Signed)
Pt would like an rx to go to [facility] [address] [place] for the prevnar so she can get it there I am not 100% sure how to do that rx

## 2022-03-06 NOTE — Telephone Encounter (Signed)
Are we going to be doing the rsv injections?

## 2022-03-07 NOTE — Addendum Note (Signed)
Addended by: Alfonse Spruce on: 03/07/2022 05:21 PM   Modules accepted: Orders

## 2022-03-07 NOTE — Telephone Encounter (Signed)
Sounds good - I sent it in. Again, I'm not sure it is covered.   Malachi Bonds, MD [facility]

## 2022-03-07 NOTE — Telephone Encounter (Signed)
Called patient and informed her that the medication has been sent in and that it may not be covered by insurance. 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

## 2022-03-08 ENCOUNTER — Telehealth: Payer: Self-pay | Admitting: Allergy & Immunology

## 2022-03-08 MED ORDER — PNEUMOCOCCAL 20-VAL CONJ VACC 0.5 ML IM SUSY: 0.5000 mL | PREFILLED_SYRINGE | INTRAMUSCULAR | 0 refills | Status: AC

## 2022-03-08 NOTE — Telephone Encounter (Signed)
Had to give a verbal for the PREVNAR 20 over the phone, it wouldn't allow Dr. Dellis Anes to place an E-script.

## 2022-03-08 NOTE — Telephone Encounter (Signed)
Mariah [name] called in and states That Dr. Dellis Anes was supposed to call in a Pneumonia vaccine in to [facility] and she states they haven't received it yet.  She would like it called in please.

## 2022-03-08 NOTE — Addendum Note (Signed)
Addended by: Alfonse Spruce on: 03/08/2022 09:57 AM   Modules accepted: Orders

## 2022-03-08 NOTE — Telephone Encounter (Signed)
I attempted to order again without success. I gave Lachelle the ability to verbal the prescription over the phone instead.

## 2022-03-11 ENCOUNTER — Other Ambulatory Visit: Payer: Self-pay | Admitting: Allergy & Immunology

## 2022-03-12 NOTE — Telephone Encounter (Signed)
Verbal was given to Mariah [name] over the phone for [place] 20, on Friday 15th.

## 2022-03-18 ENCOUNTER — Ambulatory Visit
Admission: EM | Admit: 2022-03-18 | Discharge: 2022-03-18 | Disposition: A | Discharge status: Home / Self Care | Payer: 59 | Attending: Nurse Practitioner | Admitting: Nurse Practitioner

## 2022-03-18 DIAGNOSIS — Z8709 Personal history of other diseases of the respiratory system: Secondary | ICD-10-CM | POA: Diagnosis present

## 2022-03-18 DIAGNOSIS — Z1152 Encounter for screening for COVID-19: Secondary | ICD-10-CM | POA: Insufficient documentation

## 2022-03-18 DIAGNOSIS — R0982 Postnasal drip: Secondary | ICD-10-CM | POA: Diagnosis not present

## 2022-03-18 DIAGNOSIS — J029 Acute pharyngitis, unspecified: Secondary | ICD-10-CM | POA: Diagnosis present

## 2022-03-18 MED ORDER — LIDOCAINE VISCOUS HCL 2 % MT SOLN: 5.0000 mL | OROMUCOSAL | 0 refills | Status: DC | PRN

## 2022-03-18 MED ORDER — FLUTICASONE PROPIONATE 50 MCG/ACT NA SUSP: 2.0000 | Freq: Every day | NASAL | 0 refills | Status: DC

## 2022-03-18 NOTE — ED Triage Notes (Signed)
Pt presents with c/o sore throat for past couple of days , also reports post nasal drip

## 2022-03-18 NOTE — Discharge Instructions (Addendum)
Rapid strep test is negative, COVID test and throat culture are pending.  You will be contacted if the pending test results are positive to discuss treatment.  If your COVID test is positive, you are a candidate to receive Paxlovid as an antiviral. Take medication as prescribed.  Continue your current allergy medication.   Increase fluids and allow for plenty of rest. Recommend Tylenol or ibuprofen as needed for pain, fever, or general discomfort. Recommend throat lozenges, Chloraseptic or honey to help with throat pain. Warm salt water gargles 3-4 times daily to help with throat pain or discomfort. Recommend a diet with soft foods to include soups, broths, puddings, yogurt, Jell-O's, or popsicles until symptoms improve. Follow-up with your primary care physician if symptoms fail to improve.

## 2022-03-18 NOTE — ED Provider Notes (Signed)
[facility]    CSN: [id number] Arrival date & time: 03/18/22  0800      History   Chief Complaint Chief Complaint  Patient presents with   Sore Throat    HPI Mariah [name] is a 36 y.o. female.   The history is provided by the patient.   Patient presents with a 2 to 3-day history of sore throat, nasal congestion, postnasal drainage, and a mild cough.  Patient states that she has an underlying history of seasonal allergies.  Patient states "my throat feels like it is on fire".  Patient states that she has not had fever, chills, ear pain, wheezing, shortness of breath, difficulty breathing, or GI symptoms.  Patient states she has been taking Mucinex and Sudafed without relief.  Also states that she takes Zyrtec daily for her seasonal allergies.  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  Past Medical History:  Diagnosis Date   Allergy    Anxiety state, unspecified    Asthma    Panic disorder without agoraphobia    Urticaria     Patient Active Problem List   Diagnosis Date Noted   Mild persistent asthma with acute exacerbation 07/24/2021   Acute non-recurrent maxillary sinusitis 07/24/2021   Gastroesophageal reflux disease 07/24/2021   Normal labor 08/07/2020   Pruritus of vagina 07/01/2019   URI (upper respiratory infection) 01/28/2017   Missed period 01/28/2017   Encounter to establish care 05/04/2015   Motion sickness 05/04/2015   Chronic urticaria 02/25/2015   Mild intermittent asthma 02/25/2015   Rash and nonspecific skin eruption 09/21/2012   Left knee pain 08/02/2011   Contraception management 05/13/2011   PAP SMEAR, ABNORMAL 04/27/2009   SYNCOPE 02/08/2009   Other allergic rhinitis 12/21/2008   Asthma, chronic 08/18/2007   Generalized anxiety disorder 03/11/2007   PANIC ATTACK 03/11/2007    Past Surgical History:  Procedure Laterality Date   TONSILLECTOMY     01/26/07- Dr. Jenne Pane   WISDOM TOOTH  EXTRACTION      OB History     Gravida  1   Para  1   Term  1   Preterm  0   AB  0   Living  1      SAB  0   IAB  0   Ectopic  0   Multiple  0   Live Births  1            Home Medications    Prior to Admission medications   Medication Sig Start Date End Date Taking? Authorizing Provider  fluticasone (FLONASE) 50 MCG/ACT nasal spray Place 2 sprays into both nostrils daily. 03/18/22  Yes Halsey Hammen-Warren, Sadie Haber, NP  lidocaine (XYLOCAINE) 2 % solution Use as directed 5 mLs in the mouth or throat as needed for mouth pain. Gargle and spit 5 mL up to 3 times daily as needed for throat pain. 03/18/22  Yes Daniela Hernan-Warren, Sadie Haber, NP  acetaminophen (TYLENOL) 500 MG tablet Take 500 mg by mouth every 6 (six) hours as needed for mild pain or headache.    [provider]  albuterol (VENTOLIN HFA) 108 (90 Base) MCG/ACT inhaler inhale 2 PUFFS into THE lungs EVERY 4 HOURS AS NEEDED FOR WHEEZING OR SHORTNESS OF BREATH 02/26/22   Alfonse Spruce, MD  cetirizine (ZYRTEC) 10 MG tablet Take 10 mg by mouth daily as needed for allergies.    [provider]  clindamycin (CLEOCIN  T) 1 % external solution clindamycin phosphate 1 % topical solution  APPLY TO AFFECTED AREA ON SKIN 2 TIMES DAILY    [provider]  drospirenone-ethinyl estradiol (YAZ) 3-0.02 MG tablet Take 1 tablet by mouth daily. 06/26/21   [provider]  escitalopram (LEXAPRO) 10 MG tablet escitalopram 10 mg tablet  TAKE 1 TABLET 1tpo EVERY DAY    [provider]  montelukast (SINGULAIR) 10 MG tablet Take 1 tablet (10 mg total) by mouth at bedtime. 02/21/22   Alfonse Spruce, MD  SYMBICORT 160-4.5 MCG/ACT inhaler Inhale 2 puffs into the lungs 2 (two) times daily. Patient not taking: Reported on 02/21/2022 12/18/21   Alfonse Spruce, MD  tretinoin (RETIN-A) 0.025 % cream tretinoin 0.025 % topical cream  APPLY TO AFFECTED AREA ON FACE EVERY EVENING    [provider]  fexofenadine (ALLEGRA) 180 MG tablet Take 180 mg by mouth daily. Reported on 08/18/2015  09/30/19  [provider]    Family History Family History  Problem Relation Age of Onset   Hypertension Father    Drug abuse Father    Arthritis Father    Crohn's disease Father    Depression Mother    Diabetes Other    Cancer Other    Stroke Other    Congestive Heart Failure Paternal Grandmother     Social History Social History   Tobacco Use   Smoking status: Never   Smokeless tobacco: Never  Vaping Use   Vaping Use: Never used  Substance Use Topics   Alcohol use: Yes    Alcohol/week: 0.0 standard drinks of alcohol    Comment: very rarely   Drug use: No     Allergies   Codeine and Vicodin [hydrocodone-acetaminophen]   Review of Systems Review of Systems Per HPI  Physical Exam Triage Vital Signs ED Triage Vitals  Enc Vitals Group     BP 03/18/22 0810 116/78     Pulse Rate 03/18/22 0810 85     Resp 03/18/22 0810 18     Temp 03/18/22 0810 98.7 F (37.1 C)     Temp src --      SpO2 03/18/22 0810 98 %     Weight --      Height --      Head Circumference --      Peak Flow --      Pain Score 03/18/22 0808 6     Pain Loc --      Pain Edu? --      Excl. in GC? --    No data found.  Updated Vital Signs BP 116/78   Pulse 85   Temp 98.7 F (37.1 C)   Resp 18   SpO2 98%   Visual Acuity Right Eye Distance:   Left Eye Distance:   Bilateral Distance:    Right Eye Near:   Left Eye Near:    Bilateral Near:     Physical Exam Vitals and nursing note reviewed.  Constitutional:      Appearance: She is well-developed.  HENT:     Head: Normocephalic and atraumatic.     Right Ear: Tympanic membrane, ear canal and external ear normal.     Left Ear: Tympanic membrane, ear canal and external ear normal.     Nose: No congestion.     Right Turbinates: Enlarged and swollen.     Left Turbinates: Enlarged and swollen.     Right Sinus: No  maxillary sinus tenderness or  frontal sinus tenderness.     Left Sinus: No maxillary sinus tenderness or frontal sinus tenderness.     Mouth/Throat:     Mouth: Mucous membranes are moist.     Pharynx: Uvula midline. Pharyngeal swelling and posterior oropharyngeal erythema present. No oropharyngeal exudate.     Tonsils: No tonsillar exudate. 1+ on the right. 1+ on the left.  Eyes:     Conjunctiva/sclera: Conjunctivae normal.     Pupils: Pupils are equal, round, and reactive to light.  Neck:     Thyroid: No thyromegaly.     Trachea: No tracheal deviation.  Cardiovascular:     Rate and Rhythm: Normal rate and regular rhythm.     Heart sounds: Normal heart sounds.  Pulmonary:     Effort: Pulmonary effort is normal.     Breath sounds: Normal breath sounds.  Abdominal:     General: Bowel sounds are normal. There is no distension.     Palpations: Abdomen is soft.     Tenderness: There is no abdominal tenderness.  Musculoskeletal:     Cervical back: Normal range of motion.  Lymphadenopathy:     Cervical: No cervical adenopathy.  Skin:    General: Skin is warm and dry.  Neurological:     General: No focal deficit present.     Mental Status: She is alert and oriented to person, place, and time.  Psychiatric:        Mood and Affect: Mood normal.        Behavior: Behavior normal.        Thought Content: Thought content normal.        Judgment: Judgment normal.      UC Treatments / Results  Labs (all labs ordered are listed, but only abnormal results are displayed) Labs Reviewed  SARS CORONAVIRUS 2 (TAT 6-24 HRS)  CULTURE, GROUP A STREP [facility])  POCT RAPID STREP A (OFFICE)    EKG   Radiology No results found.  Procedures Procedures (including critical care time)  Medications Ordered in UC Medications - No data to display  Initial Impression / Assessment and Plan / UC Course  I have reviewed the triage vital signs and the nursing notes.  Pertinent labs & imaging  results that were available during my care of the patient were reviewed by me and considered in my medical decision making (see chart for details).  Patient presents with a 2 to 3-day history of sore throat, postnasal drainage, and a mild cough.  On exam, her vital signs are stable, she is in no acute distress.  Exam is otherwise benign.  There is no cervical adenopathy, or muffled voice present.  Patient has also been afebrile since her symptoms started.  Rapid strep test is negative.  Throat culture and COVID test are pending.  Pharyngeal diagnoses include allergic rhinitis with postnasal drainage, viral upper respiratory infection, and acute pharyngitis.  In the interim, will provide patient symptomatic treatment with viscous lidocaine and fluticasone.  Patient was encouraged to continue her current allergy medication at this time.  If the COVID test is positive, patient is a candidate to receive Paxlovid.  Supportive care recommendations were provided to the patient.  Patient verbalized understanding.  All questions were answered. Final Clinical Impressions(s) / UC Diagnoses   Final diagnoses:  Sore throat  Encounter for screening for COVID-19  History of allergic rhinitis  Postnasal drip     Discharge Instructions      Rapid strep test is negative, COVID test  and throat culture are pending.  You will be contacted if the pending test results are positive to discuss treatment.  If your COVID test is positive, you are a candidate to receive Paxlovid as an antiviral. Take medication as prescribed.  Continue your current allergy medication.   Increase fluids and allow for plenty of rest. Recommend Tylenol or ibuprofen as needed for pain, fever, or general discomfort. Recommend throat lozenges, Chloraseptic or honey to help with throat pain. Warm salt water gargles 3-4 times daily to help with throat pain or discomfort. Recommend a diet with soft foods to include soups, broths, puddings, yogurt,  Jell-O's, or popsicles until symptoms improve. Follow-up with your primary care physician if symptoms fail to improve.     ED Prescriptions     Medication Sig Dispense Auth. Provider   lidocaine (XYLOCAINE) 2 % solution Use as directed 5 mLs in the mouth or throat as needed for mouth pain. Gargle and spit 5 mL up to 3 times daily as needed for throat pain. 100 mL Dorothy Landgrebe-Warren, Alda Lea, NP   fluticasone (FLONASE) 50 MCG/ACT nasal spray Place 2 sprays into both nostrils daily. 16 g Tandrea Kommer-Warren, Alda Lea, NP      PDMP not reviewed this encounter.   Tish Men, NP 03/18/22 [phone]

## 2022-03-19 LAB — SARS CORONAVIRUS 2 (TAT 6-24 HRS): SARS Coronavirus 2: NEGATIVE

## 2022-03-21 LAB — CULTURE, GROUP A STREP (THRC)

## 2022-03-26 ENCOUNTER — Encounter: Payer: Self-pay | Admitting: Allergy & Immunology

## 2022-03-26 ENCOUNTER — Ambulatory Visit (INDEPENDENT_AMBULATORY_CARE_PROVIDER_SITE_OTHER): Payer: Self-pay | Admitting: Allergy & Immunology

## 2022-03-26 VITALS — BP 128/78 | HR 91 | Temp 98.1°F | Resp 20 | Wt 151.2 lb

## 2022-03-26 DIAGNOSIS — J3089 Other allergic rhinitis: Secondary | ICD-10-CM

## 2022-03-26 DIAGNOSIS — K219 Gastro-esophageal reflux disease without esophagitis: Secondary | ICD-10-CM

## 2022-03-26 DIAGNOSIS — B999 Unspecified infectious disease: Secondary | ICD-10-CM

## 2022-03-26 DIAGNOSIS — J4520 Mild intermittent asthma, uncomplicated: Secondary | ICD-10-CM

## 2022-03-26 MED ORDER — AZITHROMYCIN 250 MG PO TABS: ORAL_TABLET | ORAL | 0 refills | Status: DC

## 2022-03-26 MED ORDER — PROMETHAZINE-DM 6.25-15 MG/5ML PO SYRP: 5.0000 mL | ORAL_SOLUTION | Freq: Four times a day (QID) | ORAL | 0 refills | Status: AC | PRN

## 2022-03-26 MED ORDER — AMOXICILLIN-POT CLAVULANATE 875-125 MG PO TABS: 1.0000 | ORAL_TABLET | Freq: Two times a day (BID) | ORAL | 0 refills | Status: AC

## 2022-03-26 MED ORDER — PREDNISONE 10 MG PO TABS: ORAL_TABLET | ORAL | 0 refills | Status: DC

## 2022-03-26 NOTE — Progress Notes (Signed)
FOLLOW UP  Date of Service/Encounter:  03/26/22   Assessment:   Mild to moderate persistent asthma - stopped Symbicort at the last visit per patient request   Allergic rhinitis - s/p allergen immunotherapy x 2    Recent pneumonia in the late 2022 / early 2023 with prolonged course of coughing and now with continued shortness of breath especially with physical activity  Acute sinusitis/bronchitis - starting prednisone and Augmentin today  Getting immune workup today   Member of [employer]  Plan/Recommendations:   1. Mild intermittent asthma, uncomplicated - We did not do testing today for your lungs. - I did not hear any crackles in your lungs today, so I do not think that this is pneumonia.  - Start the prednisone burst that I sent in. - Start the Augmentin twice daily for ten days.  - Continue with the Pulmicort three times daily for another 3-4 days.  - Daily controller medication(s): Singulair 10mg  daily - Prior to physical activity: albuterol 2 puffs 10-15 minutes before physical activity. - Rescue medications: albuterol 4 puffs every 4-6 hours as needed - Changes during respiratory infections or worsening symptoms: Add on Symbicort 125mcg two puffs twice daily OR Pulmicort nebulizer treatments twice daily for ONE TO TWO WEEKS during flares - Asthma control goals:  * Full participation in all desired activities (may need albuterol before activity) * Albuterol use two time or less a week on average (not counting use with activity) * Cough interfering with sleep two time or less a month * Oral steroids no more than once a year * No hospitalizations  2. Allergic rhinitis - Continue with Allegra one tablet once daily as needed. - Continue with Flonase one spray per nostril daily as needed.  - We are going to do an immune screens.  3. Return in about 3 months (around 06/26/2022).   Subjective:   Mariah [name] is a 35 y.o. female presenting today for follow up of   Chief Complaint  Patient presents with   Follow-up    12th day of chest congestion, 3 neb a day, patient is scared that she is developing pneu again. Mucous is thick and green, feels terrible, and out of breath, taking all her medications every day. Also having trouble sleeping. Feels like something is heavy on her chest if she lays down on her back.    Mariah [name] has a history of the following: Patient Active Problem List   Diagnosis Date Noted   Mild persistent asthma with acute exacerbation 07/24/2021   Acute non-recurrent maxillary sinusitis 07/24/2021   Gastroesophageal reflux disease 07/24/2021   Normal labor 08/07/2020   Pruritus of vagina 07/01/2019   URI (upper respiratory infection) 01/28/2017   Missed period 01/28/2017   Encounter to establish care 05/04/2015   Motion sickness 05/04/2015   Chronic urticaria 02/25/2015   Mild intermittent asthma 02/25/2015   Rash and nonspecific skin eruption 09/21/2012   Left knee pain 08/02/2011   Contraception management 05/13/2011   PAP SMEAR, ABNORMAL 04/27/2009   SYNCOPE 02/08/2009   Other allergic rhinitis 12/21/2008   Asthma, chronic 08/18/2007   Generalized anxiety disorder 03/11/2007   PANIC ATTACK 03/11/2007    History obtained from: chart review and patient.  Mariah [name] is a 36 y.o. female presenting for a sick visit.  She was last seen in August 2023.  At that time, her lung testing looked amazing.  We agreed to stop Symbicort and continue with Singulair 10 mg daily.  She  was going to keep Symbicort and to use 2 puffs twice daily during flares.  For her allergic rhinitis, she continue with Allegra and Flonase.  Since last visit, she has mostly done well. She wasw actually doing fine, but she is now on day 12 of the chest cold. She got better and then she just got bad. She has been doing the Flonase two sprays BID and Mucinex 1200mg  BID. She has been doing the Pulmicort TID and she has been sleeping propped up. Whenever she  can get something out, it is thick like Elmer's glue and now she is fatigued from all of her lack pos sleep. Her last PNA was January 2023. She has not been febrile at all for the entire time.   She did go to the ED in September for these symptoms. She has tested for strep and COVID and both were negative.   She got the Prevnar-20 shot three weeks ago. We have never done an immune workup today. She is open to doing this today.   Otherwise, there have been no changes to her past medical history, surgical history, family history, or social history.    Review of Systems  Constitutional: Negative.  Negative for chills, fever, malaise/fatigue and weight loss.  HENT: Negative.  Negative for congestion, ear discharge, ear pain and sinus pain.   Eyes:  Negative for pain, discharge and redness.  Respiratory:  Negative for cough, sputum production, shortness of breath, wheezing and stridor.   Cardiovascular: Negative.  Negative for chest pain and palpitations.  Gastrointestinal:  Negative for abdominal pain, constipation, diarrhea, heartburn, nausea and vomiting.  Skin: Negative.  Negative for itching and rash.  Neurological:  Negative for dizziness and headaches.  Endo/Heme/Allergies:  Negative for environmental allergies. Does not bruise/bleed easily.       Objective:   Blood pressure 128/78, pulse 91, temperature 98.1 F (36.7 C), temperature source Temporal, resp. rate 20, weight 151 lb 3.2 oz (68.6 kg), SpO2 99 %, not currently breastfeeding. Body mass index is 27.65 kg/m.    Physical Exam Vitals reviewed.  Constitutional:      Appearance: She is well-developed.     Comments: Talkative.  HENT:     Head: Normocephalic and atraumatic.     Right Ear: Tympanic membrane, ear canal and external ear normal.     Left Ear: Tympanic membrane, ear canal and external ear normal.     Nose: No nasal deformity, septal deviation, mucosal edema or rhinorrhea.     Right Turbinates: Enlarged,  swollen and pale.     Left Turbinates: Enlarged, swollen and pale.     Right Sinus: No maxillary sinus tenderness or frontal sinus tenderness.     Left Sinus: No maxillary sinus tenderness or frontal sinus tenderness.     Comments: Coarse upper airway sounds throughout.    Mouth/Throat:     Mouth: Mucous membranes are not pale and not dry.     Pharynx: Uvula midline.     Comments: Cobblestoning present in the posterior oropharynx.  Eyes:     General: Lids are normal. Allergic shiner present.        Right eye: No discharge.        Left eye: No discharge.     Conjunctiva/sclera: Conjunctivae normal.     Right eye: Right conjunctiva is not injected. No chemosis.    Left eye: Left conjunctiva is not injected. No chemosis.    Pupils: Pupils are equal, round, and reactive to light.  Cardiovascular:  Rate and Rhythm: Normal rate and regular rhythm.     Heart sounds: Normal heart sounds.  Pulmonary:     Effort: Pulmonary effort is normal. No tachypnea, accessory muscle usage or respiratory distress.     Breath sounds: Normal breath sounds. No wheezing, rhonchi or rales.     Comments: Moving air well in all lung fields.  No increased work of breathing. Chest:     Chest wall: No tenderness.  Lymphadenopathy:     Cervical: No cervical adenopathy.  Skin:    General: Skin is warm.     Capillary Refill: Capillary refill takes less than 2 seconds.     Coloration: Skin is not pale.     Findings: No abrasion, erythema, petechiae or rash. Rash is not papular, urticarial or vesicular.     Comments: No eczematous or urticarial lesions noted.  Neurological:     Mental Status: She is alert.  Psychiatric:        Behavior: Behavior is cooperative.      Diagnostic studies: labs sent instead      Salvatore Marvel, MD  [facility] [place]

## 2022-03-26 NOTE — Patient Instructions (Addendum)
1. Mild intermittent asthma, uncomplicated - We did not do testing today for your lungs. - I did not hear any crackles in your lungs today, so I do not think that this is pneumonia.  - Start the prednisone burst that I sent in. - Start the Augmentin twice daily for ten days.  - Continue with the Pulmicort three times daily for another 3-4 days.  - Daily controller medication(s): Singulair 10mg  daily - Prior to physical activity: albuterol 2 puffs 10-15 minutes before physical activity. - Rescue medications: albuterol 4 puffs every 4-6 hours as needed - Changes during respiratory infections or worsening symptoms: Add on Symbicort 134mcg two puffs twice daily OR Pulmicort nebulizer treatments twice daily for ONE TO TWO WEEKS during flares - Asthma control goals:  * Full participation in all desired activities (may need albuterol before activity) * Albuterol use two time or less a week on average (not counting use with activity) * Cough interfering with sleep two time or less a month * Oral steroids no more than once a year * No hospitalizations  2. Allergic rhinitis - Continue with Allegra one tablet once daily as needed. - Continue with Flonase one spray per nostril daily as needed.  - We are going to do an immune screens.  3. Return in about 3 months (around 06/26/2022).    Please inform us of any Emergency Department visits, hospitalizations, or changes in symptoms. Call us before going to the ED for breathing or allergy symptoms since we might be able to fit you in for a sick visit. Feel free to contact us anytime with any questions, problems, or concerns.  It was a pleasure to see you again today!   Websites that have reliable patient information: 1. American Academy of Asthma, Allergy, and Immunology: www.aaaai.org 2. Food Allergy Research and Education (FARE): foodallergy.org 3. Mothers of Asthmatics: http://www.asthmacommunitynetwork.org 4. American College of Allergy, Asthma,  and Immunology: www.acaai.org  "Like" [place] on Facebook and Instagram for our latest updates!       Make sure you are registered to vote! If you have moved or changed any of your contact information, you will need to get this updated before voting!  In some cases, you MAY be able to register to vote online: [email or web address]

## 2022-03-28 ENCOUNTER — Ambulatory Visit: Payer: 59 | Admitting: Allergy & Immunology

## 2022-04-02 ENCOUNTER — Other Ambulatory Visit: Payer: Self-pay | Admitting: Allergy & Immunology

## 2022-06-18 ENCOUNTER — Emergency Department (HOSPITAL_BASED_OUTPATIENT_CLINIC_OR_DEPARTMENT_OTHER)
Admission: EM | Admit: 2022-06-18 | Discharge: 2022-06-18 | Disposition: A | Discharge status: Home / Self Care | Payer: Managed Care, Other (non HMO) | Attending: Emergency Medicine | Admitting: Emergency Medicine

## 2022-06-18 ENCOUNTER — Encounter (HOSPITAL_BASED_OUTPATIENT_CLINIC_OR_DEPARTMENT_OTHER): Payer: Self-pay | Admitting: Emergency Medicine

## 2022-06-18 ENCOUNTER — Emergency Department (HOSPITAL_BASED_OUTPATIENT_CLINIC_OR_DEPARTMENT_OTHER): Payer: Managed Care, Other (non HMO) | Admitting: Radiology

## 2022-06-18 DIAGNOSIS — Z20822 Contact with and (suspected) exposure to covid-19: Secondary | ICD-10-CM | POA: Diagnosis not present

## 2022-06-18 DIAGNOSIS — F419 Anxiety disorder, unspecified: Secondary | ICD-10-CM | POA: Insufficient documentation

## 2022-06-18 DIAGNOSIS — Z7952 Long term (current) use of systemic steroids: Secondary | ICD-10-CM | POA: Insufficient documentation

## 2022-06-18 DIAGNOSIS — Z7951 Long term (current) use of inhaled steroids: Secondary | ICD-10-CM | POA: Diagnosis not present

## 2022-06-18 DIAGNOSIS — J45901 Unspecified asthma with (acute) exacerbation: Secondary | ICD-10-CM | POA: Insufficient documentation

## 2022-06-18 DIAGNOSIS — R0602 Shortness of breath: Secondary | ICD-10-CM | POA: Diagnosis present

## 2022-06-18 LAB — RESP PANEL BY RT-PCR (RSV, FLU A&B, COVID)  RVPGX2
Influenza A by PCR: NEGATIVE
Influenza B by PCR: NEGATIVE
Resp Syncytial Virus by PCR: NEGATIVE
SARS Coronavirus 2 by RT PCR: NEGATIVE

## 2022-06-18 MED ORDER — PREDNISONE 50 MG PO TABS
60.0000 mg | ORAL_TABLET | Freq: Once | ORAL | Status: AC
  Administered 2022-06-18: 60 mg via ORAL
  Filled 2022-06-18: qty 1

## 2022-06-18 MED ORDER — IPRATROPIUM-ALBUTEROL 0.5-2.5 (3) MG/3ML IN SOLN
3.0000 mL | Freq: Once | RESPIRATORY_TRACT | Status: AC
  Administered 2022-06-18: 3 mL via RESPIRATORY_TRACT
  Filled 2022-06-18: qty 3

## 2022-06-18 MED ORDER — BENZONATATE 100 MG PO CAPS
100.0000 mg | ORAL_CAPSULE | Freq: Once | ORAL | Status: AC
  Administered 2022-06-18: 100 mg via ORAL
  Filled 2022-06-18: qty 1

## 2022-06-18 MED ORDER — ALBUTEROL SULFATE HFA 108 (90 BASE) MCG/ACT IN AERS
2.0000 | INHALATION_SPRAY | RESPIRATORY_TRACT | Status: DC | PRN
  Administered 2022-06-18: 2 via RESPIRATORY_TRACT
  Filled 2022-06-18: qty 6.7

## 2022-06-18 MED ORDER — PREDNISONE 20 MG PO TABS: 40.0000 mg | ORAL_TABLET | Freq: Every day | ORAL | 0 refills | Status: AC

## 2022-06-18 MED ORDER — PROMETHAZINE-DM 6.25-15 MG/5ML PO SYRP: 5.0000 mL | ORAL_SOLUTION | Freq: Every day | ORAL | 0 refills | Status: AC

## 2022-06-18 MED ORDER — SYMBICORT 160-4.5 MCG/ACT IN AERO: 2.0000 | INHALATION_SPRAY | Freq: Two times a day (BID) | RESPIRATORY_TRACT | 5 refills | Status: DC

## 2022-06-18 NOTE — Discharge Instructions (Addendum)
You came to the emergency department today complaining of shortness of breath and cough over the past week.  You were concerned about pneumonia.  Your x-ray does not show any pneumonia.  You do not have a fever, your heart rate is normal and we are not concerned for any headed pneumonia at this time.  You were treated with DuoNebs and steroids.  The remainder of your steroid course is at your pharmacy.  With any further concerns, please follow-up with your primary care provider.  With any worsening symptoms, please return to the emergency department.  We hope you feel better!

## 2022-06-18 NOTE — ED Provider Notes (Signed)
[facility] EMERGENCY DEPT Provider Note   CSN: [id number] Arrival date & time: 06/18/22  0932     History  Chief Complaint  Patient presents with   Shortness of Breath    Mariah [name] is a 36 y.o. female with a past medical history of asthma and anxiety presenting today due to cough, congestion and shortness of breath.  She says that she feels similarly to when she had pneumonia earlier this year and she does not want to wait too long for her to get as bad.  She has been using albuterol and steroid inhalers at home without relief.  No fevers or chills.  No known sick contacts.   Shortness of Breath Associated symptoms: cough   Associated symptoms: no chest pain and no fever        Home Medications Prior to Admission medications   Medication Sig Start Date End Date Taking? Authorizing Provider  predniSONE (DELTASONE) 20 MG tablet Take 2 tablets (40 mg total) by mouth daily for 4 days. 06/18/22 06/22/22 Yes Avari Gelles A, PA-C  acetaminophen (TYLENOL) 500 MG tablet Take 500 mg by mouth every 6 (six) hours as needed for mild pain or headache.    [provider]  albuterol (VENTOLIN HFA) 108 (90 Base) MCG/ACT inhaler inhale 2 PUFFS into THE lungs EVERY 4 HOURS AS NEEDED FOR WHEEZING OR SHORTNESS OF BREATH 02/26/22   Alfonse Spruce, MD  azithromycin (ZITHROMAX) 250 MG tablet Take two tablets on day one and then one tablet daily for four more days. 03/26/22   Alfonse Spruce, MD  cetirizine (ZYRTEC) 10 MG tablet Take 10 mg by mouth daily as needed for allergies.    [provider]  clindamycin (CLEOCIN T) 1 % external solution clindamycin phosphate 1 % topical solution  APPLY TO AFFECTED AREA ON SKIN 2 TIMES DAILY    [provider]  drospirenone-ethinyl estradiol (YAZ) 3-0.02 MG tablet Take 1 tablet by mouth daily. 06/26/21   [provider]  escitalopram (LEXAPRO) 10 MG tablet escitalopram 10 mg tablet  TAKE 1 TABLET  1tpo EVERY DAY    [provider]  fluticasone (FLONASE) 50 MCG/ACT nasal spray Place 2 sprays into both nostrils daily. 03/18/22   Leath-Warren, Sadie Haber, NP  lidocaine (XYLOCAINE) 2 % solution Use as directed 5 mLs in the mouth or throat as needed for mouth pain. Gargle and spit 5 mL up to 3 times daily as needed for throat pain. 03/18/22   Leath-Warren, Sadie Haber, NP  montelukast (SINGULAIR) 10 MG tablet Take 1 tablet (10 mg total) by mouth at bedtime. 02/21/22   Alfonse Spruce, MD  SYMBICORT 160-4.5 MCG/ACT inhaler Inhale 2 puffs into the lungs 2 (two) times daily. 12/18/21   Alfonse Spruce, MD  tretinoin (RETIN-A) 0.025 % cream tretinoin 0.025 % topical cream  APPLY TO AFFECTED AREA ON FACE EVERY EVENING    [provider]  fexofenadine (ALLEGRA) 180 MG tablet Take 180 mg by mouth daily. Reported on 08/18/2015  09/30/19  [provider]      Allergies    Codeine and Vicodin [hydrocodone-acetaminophen]    Review of Systems   Review of Systems  Constitutional:  Negative for chills and fever.  HENT:  Positive for congestion.   Respiratory:  Positive for cough and shortness of breath.   Cardiovascular:  Negative for chest pain and palpitations.    Physical Exam Updated Vital Signs BP 121/76 (BP Location: Left Arm)   Pulse  92   Temp 98.4 F (36.9 C) (Oral)   Resp 20   Ht 5\' 2"  (1.575 m)   Wt 68 kg   SpO2 100%   BMI 27.44 kg/m  Physical Exam Vitals and nursing note reviewed.  Constitutional:      General: She is not in acute distress.    Appearance: Normal appearance. She is not ill-appearing.  HENT:     Head: Normocephalic and atraumatic.     Mouth/Throat:     Mouth: Mucous membranes are moist.     Pharynx: Oropharynx is clear.     Comments: S/p tonsillectomy  Eyes:     General: No scleral icterus.    Conjunctiva/sclera: Conjunctivae normal.  Cardiovascular:     Rate and Rhythm: Normal rate and regular rhythm.  Pulmonary:      Effort: Pulmonary effort is normal. No tachypnea, bradypnea, accessory muscle usage or respiratory distress.     Breath sounds: Examination of the right-upper field reveals wheezing. Examination of the left-upper field reveals wheezing. Wheezing present. No rhonchi or rales.  Musculoskeletal:     Right lower leg: No edema.     Left lower leg: No edema.  Skin:    Findings: No rash.  Neurological:     Mental Status: She is alert.  Psychiatric:        Mood and Affect: Mood normal.     ED Results / Procedures / Treatments   Labs (all labs ordered are listed, but only abnormal results are displayed) Labs Reviewed  RESP PANEL BY RT-PCR (RSV, FLU A&B, COVID)  RVPGX2    EKG None  Radiology No results found.  Procedures Procedures   Medications Ordered in ED Medications  albuterol (VENTOLIN HFA) 108 (90 Base) MCG/ACT inhaler 2 puff (2 puffs Inhalation Given 06/18/22 0852)  benzonatate (TESSALON) capsule 100 mg (has no administration in time range)  ipratropium-albuterol (DUONEB) 0.5-2.5 (3) MG/3ML nebulizer solution 3 mL (has no administration in time range)  predniSONE (DELTASONE) tablet 60 mg (has no administration in time range)    ED Course/ Medical Decision Making/ A&P                           Medical Decision Making Amount and/or Complexity of Data Reviewed Radiology: ordered.  Risk Prescription drug management.   36 year old female presenting today with viral symptoms and shortness of breath with a concern for pneumonia.  Differential includes but is not limited to flu, COVID, RSV, pneumonia, bacterial URI, PE.  Physical exam: Some upper lung lobe wheezing, no rales, no signs of infection in the oropharynx.  Testing: Negative viral swab  Treatment: DuoNeb, prednisone and albuterol.  Reassessment patient sounds and looks better  Imaging: Chest x-ray ordered, reviewed and interpreted by me.  I agree with radiology that there are no signs of pneumonia or other  abnormalities per patient's concerns  MDM/disposition: 36 year old female presenting today with productive cough, shortness of breath and she has recovered from other viral symptoms.  She is most concerned for pneumonia and appears very anxious.  No pneumonia on x-ray.  Vital signs stable, unable to apply PERC criteria due to patient's OCPs however she has no other risk factors for PE and I believe this is inconsistent with her presentation today.  She will be discharged home with the remainder of the prednisone burst for presumed asthma exacerbation and will continue to use her outpatient inhalers and treatments.  Given instructions to follow-up with PCP  Final Clinical Impression(s) / ED Diagnoses Final diagnoses:  Exacerbation of asthma, unspecified asthma severity, unspecified whether persistent    Rx / DC Orders ED Discharge Orders          Ordered    predniSONE (DELTASONE) 20 MG tablet  Daily        06/18/22 0913           Results and diagnoses were explained to the patient. Return precautions discussed in full. Patient had no additional questions and expressed complete understanding.   This chart was dictated using voice recognition software.  Despite best efforts to proofread,  errors can occur which can change the documentation meaning.    Saddie Benders, PA-C 06/18/22 9509    Alvira Monday, MD 06/18/22 [phone]

## 2022-06-18 NOTE — ED Notes (Signed)
Patient transported to X-ray 

## 2022-06-18 NOTE — ED Triage Notes (Signed)
Pt arrived POV. Pt caox4 and ambulatory. Pt c/o productive cough and increased SOB x1 week. Hx asthma, pt states she has been using her inhaler and neb txs at home with minimal relief. Wheezing with nonlabored breathing in triage.

## 2022-06-18 NOTE — ED Notes (Signed)
Went in to d/c pt. Pt and spouse had a few questions for provider. Provider made aware.

## 2022-07-05 ENCOUNTER — Other Ambulatory Visit: Payer: Self-pay | Admitting: Allergy & Immunology

## 2022-07-29 NOTE — Patient Instructions (Incomplete)
1. Mild intermittent asthma, uncomplicated - Daily controller medication(s): Singulair 10mg  daily - Prior to physical activity: albuterol 2 puffs 10-15 minutes before physical activity. - Rescue medications: albuterol 4 puffs every 4-6 hours as needed - Changes during respiratory infections or worsening symptoms: Add on Symbicort 155mcg two puffs twice daily OR Pulmicort nebulizer treatments twice daily for ONE TO TWO WEEKS during flares - Asthma control goals:  * Full participation in all desired activities (may need albuterol before activity) * Albuterol use two time or less a week on average (not counting use with activity) * Cough interfering with sleep two time or less a month * Oral steroids no more than once a year * No hospitalizations  2. Allergic rhinitis - Continue with Allegra one tablet once daily as needed. - Continue with Flonase one spray per nostril daily as needed.  - Please get the lab work ordered at the last visit for an immune screens.  3.

## 2022-07-30 ENCOUNTER — Other Ambulatory Visit: Payer: Self-pay

## 2022-07-30 ENCOUNTER — Encounter: Payer: Self-pay | Admitting: Family

## 2022-07-30 ENCOUNTER — Ambulatory Visit (HOSPITAL_COMMUNITY)
Admission: RE | Admit: 2022-07-30 | Discharge: 2022-07-30 | Disposition: A | Discharge status: Home / Self Care | Payer: Managed Care, Other (non HMO) | Source: Ambulatory Visit | Attending: Family | Admitting: Family

## 2022-07-30 ENCOUNTER — Ambulatory Visit (INDEPENDENT_AMBULATORY_CARE_PROVIDER_SITE_OTHER): Payer: Managed Care, Other (non HMO) | Admitting: Family

## 2022-07-30 VITALS — BP 120/70 | HR 94 | Temp 97.9°F | Resp 16 | Wt 153.3 lb

## 2022-07-30 DIAGNOSIS — R058 Other specified cough: Secondary | ICD-10-CM

## 2022-07-30 DIAGNOSIS — B999 Unspecified infectious disease: Secondary | ICD-10-CM

## 2022-07-30 DIAGNOSIS — J3089 Other allergic rhinitis: Secondary | ICD-10-CM | POA: Diagnosis not present

## 2022-07-30 DIAGNOSIS — J4531 Mild persistent asthma with (acute) exacerbation: Secondary | ICD-10-CM

## 2022-07-30 MED ORDER — AMOXICILLIN-POT CLAVULANATE 875-125 MG PO TABS: 1.0000 | ORAL_TABLET | Freq: Two times a day (BID) | ORAL | 0 refills | Status: DC

## 2022-07-30 MED ORDER — PREDNISONE 10 MG PO TABS: ORAL_TABLET | ORAL | 0 refills | Status: DC

## 2022-07-30 MED ORDER — TRELEGY ELLIPTA 200-62.5-25 MCG/ACT IN AEPB: INHALATION_SPRAY | RESPIRATORY_TRACT | 1 refills | Status: DC

## 2022-07-30 NOTE — Progress Notes (Signed)
[place] [address] Dept: [phone]  FOLLOW UP NOTE  Patient ID: Mariah [name], female    DOB: 1985-11-29  Age: 37 y.o. MRN: [id number] Date of Office Visit: 07/30/2022  Assessment  Chief Complaint: Other (Had cold in Jan 26 and it progressed to inflamed chest, cough, mucous greenish/tan color when cough is productive)  HPI Mariah [name] is a 37 year old female who presents today for an acute visit.  She was last seen on March 26, 2022 by Dr. Ernst Bowler for mild to moderate persistent asthma-stopped Symbicort at the last visit per patient request, allergic rhinitis-status post allergen immunotherapy x 2, recent pneumonia in late 2022-early 2023 with prolonged course of coughing and now continued shortness of breath especially with physical activity, and acute sinusitis-bronchitis.  She denies any new diagnosis or surgery since her last office visit.  She reports that on January 26 she developed a cold and it just seems like it "soured and moved into her chest."  Her symptoms are also in her head, but it feels more in her chest.  She reports a history of double pneumonia in 2023 and anytime she gets a cold it now seems to go to her chest.  She feels like the past 3 to 4 days her symptoms have been declining.  She reports a cough that can be productive and nonproductive.  The cough can be aggravated by talking a lot or eating.  What she is able to cough up can be dark brown in color, light tan, and green.  She denies hemoptysis, fever, chills, and sick contacts. She did not ever check for Covid-19. She also reports wheezing that occurs more so after coughing.  She has some tightness in her chest and nocturnal awakenings due to breathing problems.  She feels like she has pulled a muscle from coughing.  She does not have shortness of breath yet. She is having to sleep sitting up due to the cough.  She is using lidocaine to numb her throat.  She is currently using Symbicort 160/4.5  mcg 2 puffs twice a day and if she needs 1 more puff she will do it.  She has been using Symbicort for the past month.  She also has been doing Pulmicort via her nebulizer at night and Singulair 10 mg once a day.  She says she knows that she is doing more than what she should and that is why she is here today.  She has been using her albuterol at night and does not feel like it works for her.  She feels like Symbicort seems to work better.  Since having pneumonia in 2023 the Symbicort seems to work better than the albuterol.  She is not using her albuterol HFA.  Since her last office visit she has made 1 trip to the emergency room on June 18, 2022 for exacerbation of asthma.  She was given prednisone.  She was also given prednisone at her last office visit on March 26, 2022.  Discussed the side effects of prednisone such as osteoporosis, avascular necrosis, and cataracts.  Allergic rhinitis: She reports nasal congestion and postnasal drip that she thinks is causing her to cough also.  She denies rhinorrhea.  She is using Allegra as needed and she has been using Flonase 2 sprays each nostril once a day.  She does use saline rinses often.  She is interested now in getting the immune screen that was ordered at her last office visit.  She  mentions the reason she did not get her lab work at that office visit was due to not having her new insurance card at that time.  She has not been treated for any sinus infections since we last saw her.   Drug Allergies:  Allergies  Allergen Reactions   Codeine Nausea Only and Other (See Comments)    Passes out   Vicodin [Hydrocodone-Acetaminophen] Nausea Only and Other (See Comments)    Passes out    Review of Systems: Review of Systems  Constitutional:  Negative for chills and fever.       Reports feeling tired and worn out.  HENT:         Reports postnasal drip and nasal congestion.  Denies rhinorrhea  Eyes:        Denies itchy watery eyes  Respiratory:   Positive for cough, sputum production and wheezing. Negative for hemoptysis and shortness of breath.        Reports cough that can be productive at times with dark brown sputum, light tan sputum, and green sputum.  The cough can also be nonproductive.  She also reports wheezing more after coughing, tightness in her chest, and nocturnal awakenings due to breathing problems.  Denies shortness of breath  Cardiovascular:  Negative for chest pain and palpitations.  Gastrointestinal:        Reports reflux symptoms maybe once or twice a week  Genitourinary:  Positive for frequency.       Reports increased frequency of urination, but mentions she is drinking more fluids  Skin:  Negative for itching and rash.  Neurological:  Positive for headaches.       Reports that she feels her headache is due to her cough  Endo/Heme/Allergies:  Positive for environmental allergies.     Physical Exam: BP 120/70   Pulse 94   Temp 97.9 F (36.6 C) (Temporal)   Resp 16   Wt 153 lb 4.8 oz (69.5 kg)   SpO2 99%   BMI 28.04 kg/m    Physical Exam Constitutional:      Appearance: Normal appearance.  HENT:     Head: Normocephalic and atraumatic.     Comments: Pharynx normal. Eyes normal. Ears normal. Nose: bilateral lower turbinates mildly edematous. Scabbing noted in left nostril. Irritation noted in right nostril    Right Ear: Tympanic membrane, ear canal and external ear normal.     Left Ear: Tympanic membrane, ear canal and external ear normal.     Mouth/Throat:     Mouth: Mucous membranes are moist.     Pharynx: Oropharynx is clear.  Eyes:     Conjunctiva/sclera: Conjunctivae normal.  Cardiovascular:     Rate and Rhythm: Regular rhythm.     Heart sounds: Normal heart sounds.  Pulmonary:     Effort: Pulmonary effort is normal.     Breath sounds: Normal breath sounds.     Comments: Lungs clear to auscultation Musculoskeletal:     Cervical back: Neck supple.  Skin:    General: Skin is warm.   Neurological:     Mental Status: She is alert and oriented to person, place, and time.  Psychiatric:        Mood and Affect: Mood normal.        Behavior: Behavior normal.        Thought Content: Thought content normal.        Judgment: Judgment normal.     Diagnostics: FVC 3.16 L (90%), FEV1 2.64 L (90%).  Spirometry indicates normal spirometry.  Assessment and Plan: 1. Mild persistent asthma with acute exacerbation   2. Productive cough   3. Other allergic rhinitis   4. Recurrent infections     Meds ordered this encounter  Medications   Fluticasone-Umeclidin-Vilant (TRELEGY ELLIPTA) 200-62.5-25 MCG/ACT AEPB    Sig: Inhale one puff once a day to help prevent cough and wheeze. Rinse mouth out after.    Dispense:  28 each    Refill:  1   predniSONE (DELTASONE) 10 MG tablet    Sig: Take one tablet twice a day for 4 days, then on the 5th day take one tablet and stop.    Dispense:  9 tablet    Refill:  0    Patient Instructions  1.  Asthma- not well controlled-with acute exacerbation Get STAT PA/lateral chest x-ray. We will call you with results once they are back. Start prednisone 10 mg taking 1 tablet twice a day for 4 days, then on the 5th day take one tablet and stop Stop Symbicort 160/4.5 mcg and Pulmicort via nebulizer - Daily controller medication(s): Singulair 10mg  daily and START Trelegy 200 mcg one puff once a day to help prevent cough and wheeze. Rinse mouth out after. Demonstration given and sample given. Verbally instructed to start Trelegy tomorrow - Prior to physical activity: albuterol 2 puffs 10-15 minutes before physical activity. - Rescue medications: albuterol 4 puffs every 4-6 hours as needed  - Asthma control goals:  * Full participation in all desired activities (may need albuterol before activity) * Albuterol use two time or less a week on average (not counting use with activity) * Cough interfering with sleep two time or less a month * Oral steroids  no more than once a year * No hospitalizations  2. Allergic rhinitis - Continue with Allegra one tablet once daily as needed. - Continue with Flonase one to two sprays per nostril daily as needed. Stop using this for the next few days due to the irritation noted in both nostrils. In the right nostril, point the applicator out toward the right ear. In the left nostril, point the applicator out toward the left ear - Please get the lab work ordered at the last visit for an immune screen. We will call you with results once they are all back.  If your symptoms do not get better or you develop a fever please let us know  3. Keep your already scheduled follow up appointment on 08/22/22 @ 10:45 AM with Dr. Ernst Bowler  Return in about 23 days (around 08/22/2022), or if symptoms worsen or fail to improve.    Thank you for the opportunity to care for this patient.  Please do not hesitate to contact me with questions.  Althea Charon, FNP [facility] [place]

## 2022-07-30 NOTE — Progress Notes (Signed)
Please let Mariah [name] know that her chest x-ray is normal. This is good news. I will send in an antibiotic and treat her for a sinus infection. Does she tolerate Augmentin? If she can tolerate this let me know and I will send it in.

## 2022-07-30 NOTE — Progress Notes (Signed)
Thanks! Prescription for Augmentin sent.

## 2022-07-30 NOTE — Addendum Note (Signed)
Addended by: Eloy End D on: 07/30/2022 01:49 PM   Modules accepted: Orders

## 2022-08-06 LAB — CBC WITH DIFFERENTIAL
Basophils Absolute: 0 10*3/uL (ref 0.0–0.2)
Basos: 0 %
EOS (ABSOLUTE): 0.2 10*3/uL (ref 0.0–0.4)
Eos: 2 %
Hematocrit: 40.7 % (ref 34.0–46.6)
Hemoglobin: 13.4 g/dL (ref 11.1–15.9)
Immature Grans (Abs): 0 10*3/uL (ref 0.0–0.1)
Immature Granulocytes: 0 %
Lymphocytes Absolute: 1.8 10*3/uL (ref 0.7–3.1)
Lymphs: 20 %
MCH: 26.2 pg — ABNORMAL LOW (ref 26.6–33.0)
MCHC: 32.9 g/dL (ref 31.5–35.7)
MCV: 80 fL (ref 79–97)
Monocytes Absolute: 0.5 10*3/uL (ref 0.1–0.9)
Monocytes: 5 %
Neutrophils Absolute: 6.8 10*3/uL (ref 1.4–7.0)
Neutrophils: 73 %
RBC: 5.12 x10E6/uL (ref 3.77–5.28)
RDW: 13 % (ref 11.7–15.4)
WBC: 9.3 10*3/uL (ref 3.4–10.8)

## 2022-08-06 LAB — STREP PNEUMONIAE 23 SEROTYPES IGG
Pneumo Ab Type 1*: 2.3 ug/mL (ref >1.3)
Pneumo Ab Type 12 (12F)*: 0.4 ug/mL — ABNORMAL LOW (ref >1.3)
Pneumo Ab Type 14*: 4.1 ug/mL (ref >1.3)
Pneumo Ab Type 17 (17F)*: <0.1 ug/mL — ABNORMAL LOW (ref >1.3)
Pneumo Ab Type 19 (19F)*: 1.7 ug/mL (ref >1.3)
Pneumo Ab Type 2*: 4.2 ug/mL (ref >1.3)
Pneumo Ab Type 20*: 2.9 ug/mL (ref >1.3)
Pneumo Ab Type 22 (22F)*: 2.4 ug/mL (ref >1.3)
Pneumo Ab Type 23 (23F)*: 1.7 ug/mL (ref >1.3)
Pneumo Ab Type 26 (6B)*: 0.1 ug/mL — ABNORMAL LOW (ref >1.3)
Pneumo Ab Type 3*: 1.7 ug/mL (ref >1.3)
Pneumo Ab Type 34 (10A)*: >19.5 ug/mL (ref >1.3)
Pneumo Ab Type 4*: 0.5 ug/mL — ABNORMAL LOW (ref >1.3)
Pneumo Ab Type 43 (11A)*: 0.8 ug/mL — ABNORMAL LOW (ref >1.3)
Pneumo Ab Type 5*: 2.7 ug/mL (ref >1.3)
Pneumo Ab Type 51 (7F)*: 3.8 ug/mL (ref >1.3)
Pneumo Ab Type 54 (15B)*: 6.2 ug/mL (ref >1.3)
Pneumo Ab Type 56 (18C)*: 2.1 ug/mL (ref >1.3)
Pneumo Ab Type 57 (19A)*: 2.8 ug/mL (ref >1.3)
Pneumo Ab Type 68 (9V)*: 0.9 ug/mL — ABNORMAL LOW (ref >1.3)
Pneumo Ab Type 70 (33F)*: 4.3 ug/mL (ref >1.3)
Pneumo Ab Type 8*: 1.1 ug/mL — ABNORMAL LOW (ref >1.3)
Pneumo Ab Type 9 (9N)*: 0.5 ug/mL — ABNORMAL LOW (ref >1.3)

## 2022-08-06 LAB — IGG, IGA, IGM
IgA/Immunoglobulin A, Serum: 105 mg/dL (ref 87–352)
IgG (Immunoglobin G), Serum: 879 mg/dL (ref 586–1602)
IgM (Immunoglobulin M), Srm: 128 mg/dL (ref 26–217)

## 2022-08-06 LAB — DIPHTHERIA / TETANUS ANTIBODY PANEL
Diphtheria Ab: >3 IU/mL (ref <0.10)
Tetanus Ab, IgG: 3.75 IU/mL (ref <0.10)

## 2022-08-06 LAB — COMPLEMENT, TOTAL: Compl, Total (CH50): >60 U/mL (ref >41)

## 2022-08-21 ENCOUNTER — Telehealth: Payer: Self-pay | Admitting: Family

## 2022-08-21 NOTE — Telephone Encounter (Signed)
Sure we treat that. No problem!   Salvatore Marvel, MD [facility] [place] [facility]

## 2022-08-21 NOTE — Telephone Encounter (Signed)
Called patient to advise her she can keep appointment for tomorrow. Left message for pt to call back.

## 2022-08-21 NOTE — Telephone Encounter (Signed)
Patient has an appointment with Dr. Ernst Bowler tomorrow. Patient states she has had pink eye for over a week - her symptoms started last Monday. Patient is wanting to know if Dr. Ernst Bowler will still see her tomorrow and if that is something he will treat.   Best contact number: [phone]

## 2022-08-22 ENCOUNTER — Encounter: Payer: Self-pay | Admitting: Allergy & Immunology

## 2022-08-22 ENCOUNTER — Ambulatory Visit (INDEPENDENT_AMBULATORY_CARE_PROVIDER_SITE_OTHER): Payer: Managed Care, Other (non HMO) | Admitting: Allergy & Immunology

## 2022-08-22 ENCOUNTER — Other Ambulatory Visit: Payer: Self-pay

## 2022-08-22 ENCOUNTER — Ambulatory Visit: Payer: 59 | Admitting: Allergy & Immunology

## 2022-08-22 VITALS — BP 112/70 | HR 90 | Temp 98.3°F | Resp 16 | Ht 62.0 in | Wt 152.1 lb

## 2022-08-22 DIAGNOSIS — B999 Unspecified infectious disease: Secondary | ICD-10-CM | POA: Diagnosis not present

## 2022-08-22 DIAGNOSIS — J0100 Acute maxillary sinusitis, unspecified: Secondary | ICD-10-CM

## 2022-08-22 DIAGNOSIS — J4540 Moderate persistent asthma, uncomplicated: Secondary | ICD-10-CM | POA: Insufficient documentation

## 2022-08-22 DIAGNOSIS — K219 Gastro-esophageal reflux disease without esophagitis: Secondary | ICD-10-CM

## 2022-08-22 MED ORDER — MOXIFLOXACIN HCL 0.5 % OP SOLN: 1.0000 [drp] | Freq: Three times a day (TID) | OPHTHALMIC | 0 refills | Status: AC

## 2022-08-22 MED ORDER — FLUTICASONE PROPIONATE 50 MCG/ACT NA SUSP: 1.0000 | Freq: Every day | NASAL | 2 refills | Status: DC | PRN

## 2022-08-22 MED ORDER — ALBUTEROL SULFATE HFA 108 (90 BASE) MCG/ACT IN AERS: 2.0000 | INHALATION_SPRAY | RESPIRATORY_TRACT | 1 refills | Status: DC | PRN

## 2022-08-22 MED ORDER — CEFDINIR 300 MG PO CAPS: 300.0000 mg | ORAL_CAPSULE | Freq: Two times a day (BID) | ORAL | 0 refills | Status: AC

## 2022-08-22 MED ORDER — TRELEGY ELLIPTA 200-62.5-25 MCG/ACT IN AEPB: INHALATION_SPRAY | RESPIRATORY_TRACT | 2 refills | Status: DC

## 2022-08-22 NOTE — Progress Notes (Signed)
FOLLOW UP  Date of Service/Encounter:  08/22/22   Assessment:   Mild to moderate persistent asthma - stopped Symbicort at the last visit per patient request   Allergic rhinitis - s/p allergen immunotherapy x 2 years   Recent pneumonia in the late 2022 / early 2023 with prolonged course of coughing and now with continued shortness of breath especially with physical activity   Acute sinusitis with bacterial conjunctivitis - starting cefdinir and Vigamox eyedrops today   Rechecking streptococcal titers today   Member of [employer]    Plan/Recommendations:   1. Moderate persistent asthma, uncomplicated - It seems that the Trelegy is doing the trick.  - We are not going to do a lung test today since you are sick.  - Daily controller medication(s): Trelegy 249mg one puff once daily + Singulair '10mg'$  daily - Prior to physical activity: albuterol 2 puffs 10-15 minutes before physical activity. - Rescue medications: albuterol 4 puffs every 4-6 hours as needed - Asthma control goals:  * Full participation in all desired activities (may need albuterol before activity) * Albuterol use two time or less a week on average (not counting use with activity) * Cough interfering with sleep two time or less a month * Oral steroids no more than once a year * No hospitalizations  2. Allergic rhinitis - with overlying sinusitis  - Continue with Allegra one tablet once daily as needed. - Continue with Flonase one spray per nostril daily as needed.  - Start cefdinir '300mg'$  twice daily for ten days.  - This should cover bugs that cause conjunctivitis as well.  - We will start Vigamox twice daily for ten days as well to attack the bacteria from that way as well.   3. Recurrent infections - We are getting repeat Streptococcal titers today to make sure that you responded well to the vaccine.   4. Return in about 3 months (around 11/20/2022).    Subjective:   Mariah LEUENBERGERis a 37y.o.  female presenting today for follow up of  Chief Complaint  Patient presents with   Mild persistent asthma, uncomplicated   Follow-up   Sinus Problem    Mariah KASSAYhas a history of the following: Patient Active Problem List   Diagnosis Date Noted   Mild persistent asthma with acute exacerbation 07/24/2021   Acute non-recurrent maxillary sinusitis 07/24/2021   Gastroesophageal reflux disease 07/24/2021   Normal labor 08/07/2020   Pruritus of vagina 07/01/2019   URI (upper respiratory infection) 01/28/2017   Missed period 01/28/2017   Encounter to establish care 05/04/2015   Motion sickness 05/04/2015   Chronic urticaria 02/25/2015   Mild intermittent asthma 02/25/2015   Rash and nonspecific skin eruption 09/21/2012   Left knee pain 08/02/2011   Contraception management 05/13/2011   PAP SMEAR, ABNORMAL 04/27/2009   SYNCOPE 02/08/2009   Other allergic rhinitis 12/21/2008   Asthma, chronic 08/18/2007   Generalized anxiety disorder 03/11/2007   PANIC ATTACK 03/11/2007    History obtained from: chart review and patient.  LReyleeis a 37y.o. female presenting for a follow up visit. She was last seen in February 2024.  At that time, her asthma was not well-controlled.  She got a chest x-ray which was normal.  She was started on a prednisone pack.  Her Symbicort was stopped and she was started on Trelegy 200 mcg 1 puff once daily.  She was continued on Singulair.  For her allergic rhinitis, we continue with Allegra  and Flonase.  We did get an immune screen that showed protection to 15 out of 23 strains of Streptococcus pneumonia.  I thought this was excellent, but offered a Pneumovax or Prevnar 20.  Since the last visit, she has not done well.  She did feel better for a few days after the last antibiotic course.  Asthma/Respiratory Symptom History: She does feel that the Trelegy is working well. She thinks that she got a cold last week and then she got an eye infection. She  has  Allergic Rhinitis Symptom History: She thinks that she got a cold last week and then she got an eye infection. She was told to use Pataday which she tried. It did not seem to work too well. Her right eye was swollen shut yesterday. It is matted in the morning time. SHe had some gunkiness with "peanut buttery".    She estimates that she been on antibiotics every 2 to 3 months in 2023.  She is hoping that her Pneumovax is boosted her immune system a bit.  She is going to give blood today to check on this titers.  She got her pneumonia shot over 4 weeks ago.  Otherwise, there have been no changes to her past medical history, surgical history, family history, or social history.    Review of Systems  Constitutional: Negative.  Negative for chills, fever, malaise/fatigue and weight loss.  HENT:  Positive for congestion and sinus pain. Negative for ear discharge and ear pain.   Eyes:  Negative for pain, discharge and redness.  Respiratory:  Negative for cough, sputum production, shortness of breath and wheezing.   Cardiovascular: Negative.  Negative for chest pain and palpitations.  Gastrointestinal:  Negative for abdominal pain, constipation, diarrhea, heartburn, nausea and vomiting.  Skin: Negative.  Negative for itching and rash.  Neurological:  Negative for dizziness and headaches.  Endo/Heme/Allergies:  Negative for environmental allergies. Does not bruise/bleed easily.       Objective:   Blood pressure 112/70, pulse 90, temperature 98.3 F (36.8 C), temperature source Temporal, resp. rate 16, height '5\' 2"'$  (1.575 m), weight 152 lb 1.6 oz (69 kg), SpO2 98 %. Body mass index is 27.82 kg/m.    Physical Exam Vitals reviewed.  Constitutional:      Appearance: She is well-developed.     Comments: Talkative.  HENT:     Head: Normocephalic and atraumatic.     Right Ear: Tympanic membrane, ear canal and external ear normal.     Left Ear: Tympanic membrane, ear canal and external  ear normal.     Nose: No nasal deformity, septal deviation, mucosal edema or rhinorrhea.     Right Turbinates: Enlarged, swollen and pale.     Left Turbinates: Enlarged, swollen and pale.     Right Sinus: Maxillary sinus tenderness present. No frontal sinus tenderness.     Left Sinus: Maxillary sinus tenderness present. No frontal sinus tenderness.     Comments: Coarse upper airway sounds throughout. Right sided facial pressure.     Mouth/Throat:     Mouth: Mucous membranes are not pale and not dry.     Pharynx: Uvula midline.     Comments: Cobblestoning present in the posterior oropharynx.  Eyes:     General: Lids are normal. Allergic shiner present.        Right eye: No discharge.        Left eye: No discharge.     Conjunctiva/sclera: Conjunctivae normal.     Right eye:  Right conjunctiva is not injected. No chemosis.    Left eye: Left conjunctiva is not injected. No chemosis.    Pupils: Pupils are equal, round, and reactive to light.  Cardiovascular:     Rate and Rhythm: Normal rate and regular rhythm.     Heart sounds: Normal heart sounds.  Pulmonary:     Effort: Pulmonary effort is normal. No tachypnea, accessory muscle usage or respiratory distress.     Breath sounds: Normal breath sounds. No wheezing, rhonchi or rales.     Comments: Moving air well in all lung fields.  No increased work of breathing. Chest:     Chest wall: No tenderness.  Lymphadenopathy:     Cervical: No cervical adenopathy.  Skin:    General: Skin is warm.     Capillary Refill: Capillary refill takes less than 2 seconds.     Coloration: Skin is not pale.     Findings: No abrasion, erythema, petechiae or rash. Rash is not papular, urticarial or vesicular.     Comments: No eczematous or urticarial lesions noted.  Neurological:     Mental Status: She is alert.  Psychiatric:        Behavior: Behavior is cooperative.      Diagnostic studies: labs sent instead      Salvatore Marvel, MD  Allergy and  [facility] [place]

## 2022-08-22 NOTE — Patient Instructions (Addendum)
1. Moderate persistent asthma, uncomplicated - It seems that the Trelegy is doing the trick.  - We are not going to do a lung test today since you are sick.  - Daily controller medication(s): Trelegy 251mg one puff once daily + Singulair '10mg'$  daily - Prior to physical activity: albuterol 2 puffs 10-15 minutes before physical activity. - Rescue medications: albuterol 4 puffs every 4-6 hours as needed - Asthma control goals:  * Full participation in all desired activities (may need albuterol before activity) * Albuterol use two time or less a week on average (not counting use with activity) * Cough interfering with sleep two time or less a month * Oral steroids no more than once a year * No hospitalizations  2. Allergic rhinitis - with overlying sinusitis  - Continue with Allegra one tablet once daily as needed. - Continue with Flonase one spray per nostril daily as needed.  - Start cefdinir '300mg'$  twice daily for ten days.  - This should cover bugs that cause conjunctivitis as well.  - We will start Vigamox twice daily for ten days as well to attack the bacteria from that way as well.   3. Recurrent infections - We are getting repeat Streptococcal titers today to make sure that you responded well to the vaccine.   4. Return in about 3 months (around 11/20/2022).    Please inform [place] any Emergency Department visits, hospitalizations, or changes in symptoms. Call [place] going to the ED for breathing or allergy symptoms since we might be able to fit you in for a sick visit. Feel free to contact [place]anytime with any questions, problems, or concerns.  It was a pleasure to see you again today!   Websites that have reliable patient information: 1. American Academy of Asthma, Allergy, and Immunology: www.aaaai.org 2. Food Allergy Research and Education (FARE): foodallergy.org 3. Mothers of Asthmatics: http://www.asthmacommunitynetwork.org 4. American College of Allergy, Asthma, and  Immunology: www.acaai.org  "Like" [facility] Facebook and Instagram for our latest updates!       Make sure you are registered to vote! If you have moved or changed any of your contact information, you will need to get this updated before voting!  In some cases, you MAY be able to register to vote online: [email or web address]   [email or web address]

## 2022-08-29 ENCOUNTER — Other Ambulatory Visit: Payer: Self-pay | Admitting: *Deleted

## 2022-08-29 ENCOUNTER — Encounter: Payer: Self-pay | Admitting: *Deleted

## 2022-08-29 LAB — STREP PNEUMONIAE 23 SEROTYPES IGG
Pneumo Ab Type 1*: 1.7 ug/mL (ref >1.3)
Pneumo Ab Type 12 (12F)*: 0.2 ug/mL — ABNORMAL LOW (ref >1.3)
Pneumo Ab Type 14*: 5.7 ug/mL (ref >1.3)
Pneumo Ab Type 17 (17F)*: <0.1 ug/mL — ABNORMAL LOW (ref >1.3)
Pneumo Ab Type 19 (19F)*: 2.1 ug/mL (ref >1.3)
Pneumo Ab Type 2*: 2.7 ug/mL (ref >1.3)
Pneumo Ab Type 20*: 0.9 ug/mL — ABNORMAL LOW (ref >1.3)
Pneumo Ab Type 22 (22F)*: 1.5 ug/mL (ref >1.3)
Pneumo Ab Type 23 (23F)*: 1.4 ug/mL (ref >1.3)
Pneumo Ab Type 26 (6B)*: <0.1 ug/mL — ABNORMAL LOW (ref >1.3)
Pneumo Ab Type 3*: 0.4 ug/mL — ABNORMAL LOW (ref >1.3)
Pneumo Ab Type 34 (10A)*: 16.3 ug/mL (ref >1.3)
Pneumo Ab Type 4*: 0.7 ug/mL — ABNORMAL LOW (ref >1.3)
Pneumo Ab Type 43 (11A)*: 0.7 ug/mL — ABNORMAL LOW (ref >1.3)
Pneumo Ab Type 5*: 2.6 ug/mL (ref >1.3)
Pneumo Ab Type 51 (7F)*: 4.1 ug/mL (ref >1.3)
Pneumo Ab Type 54 (15B)*: 7 ug/mL (ref >1.3)
Pneumo Ab Type 56 (18C)*: 1.1 ug/mL — ABNORMAL LOW (ref >1.3)
Pneumo Ab Type 57 (19A)*: 2.8 ug/mL (ref >1.3)
Pneumo Ab Type 68 (9V)*: 1.2 ug/mL — ABNORMAL LOW (ref >1.3)
Pneumo Ab Type 70 (33F)*: 2.7 ug/mL (ref >1.3)
Pneumo Ab Type 8*: 0.8 ug/mL — ABNORMAL LOW (ref >1.3)
Pneumo Ab Type 9 (9N)*: 0.3 ug/mL — ABNORMAL LOW (ref >1.3)

## 2022-08-29 MED ORDER — AZITHROMYCIN 250 MG PO TABS: ORAL_TABLET | ORAL | 0 refills | Status: DC

## 2022-08-29 MED ORDER — AZITHROMYCIN 500 MG PO TABS: ORAL_TABLET | ORAL | 3 refills | Status: DC

## 2022-09-18 ENCOUNTER — Telehealth: Payer: Self-pay

## 2022-09-18 ENCOUNTER — Other Ambulatory Visit: Payer: Self-pay | Admitting: Family

## 2022-09-18 MED ORDER — ONDANSETRON HCL 4 MG PO TABS: 4.0000 mg | ORAL_TABLET | Freq: Three times a day (TID) | ORAL | 0 refills | Status: DC | PRN

## 2022-09-18 MED ORDER — AZITHROMYCIN 250 MG PO TABS: ORAL_TABLET | ORAL | 0 refills | Status: DC

## 2022-09-18 NOTE — Progress Notes (Signed)
Prescription for azithromycin and Zofran sent. See telephone call from today.  Althea Charon, FNP

## 2022-09-18 NOTE — Telephone Encounter (Signed)
Please let the patient know that I  discussed this with Dr. Simona Huh and we are going to change her to Azithromycin 350 mg by mouth 3 times a week - Monday, Wednesday and Friday. This is acceptable for prophylaxis. I will send in enough Azithromycin to cover her until Dr. Ernst Bowler is back in the office on Tuesday. Please call Tuesday with an update.  I will also send in a few Zofran to have on hand, but this is not a good long term plan. I will put in both prescriptions.

## 2022-09-18 NOTE — Telephone Encounter (Signed)
Patient called in - DOB/Pharmacy verified - stated she is now having a lot of nausea since beginning to take the Azithromycin (Zithromax) 500 mg by mouth 3 times a week - Monday, Wednesday, Friday.  Patient is requesting Ondansetron (Zofran) be sent to [facility].  Patient advised Dr. Ernst Bowler will not be in the until next - I will forward message to another provider to review her request - she will be contacted provider advises next step.  Patient verbalized understanding, no further questions.

## 2022-09-18 NOTE — Telephone Encounter (Signed)
Lvm for patient to call the office back so that someone can discuss Mariah [name]'s suggestions and medications that were sent in for her.

## 2022-09-23 NOTE — Telephone Encounter (Signed)
I have used doxycycline 100mg  BID as prophylaxis as well. We could try changing to that.   Salvatore Marvel, MD [facility] [place]

## 2022-09-23 NOTE — Telephone Encounter (Signed)
Patient says that she has been splitting the Azithromycin in half and doing 250mg  on Monday, Wednesday, and Friday and it has been helping. She want's to know if that is enough for prophylaxis? She is also asking if she can have Diflucan to help as well?

## 2022-09-24 NOTE — Telephone Encounter (Signed)
Called patient - DOB verified - stated she will try OTC methods if needed because she doesn't want to change antibiotics at this time.  Forwarding message to provider.

## 2022-09-24 NOTE — Telephone Encounter (Signed)
Does she need Diflucan to be a chronic medication?  If she is going to need that regularly, I would rather just change antibiotics.  But if this is an intermittent needed, that is fine with me.

## 2022-09-25 NOTE — Telephone Encounter (Signed)
Sounds good to me.  Darlinda Bellows, MD [facility]  

## 2022-10-17 ENCOUNTER — Other Ambulatory Visit: Payer: Self-pay | Admitting: Allergy & Immunology

## 2022-10-31 ENCOUNTER — Other Ambulatory Visit: Payer: Self-pay

## 2022-10-31 ENCOUNTER — Ambulatory Visit: Payer: Managed Care, Other (non HMO) | Admitting: Allergy & Immunology

## 2022-10-31 ENCOUNTER — Encounter: Payer: Self-pay | Admitting: Allergy & Immunology

## 2022-10-31 VITALS — Ht 61.81 in | Wt 150.0 lb

## 2022-10-31 DIAGNOSIS — J4540 Moderate persistent asthma, uncomplicated: Secondary | ICD-10-CM | POA: Diagnosis not present

## 2022-10-31 DIAGNOSIS — K219 Gastro-esophageal reflux disease without esophagitis: Secondary | ICD-10-CM | POA: Diagnosis not present

## 2022-10-31 DIAGNOSIS — J3089 Other allergic rhinitis: Secondary | ICD-10-CM

## 2022-10-31 MED ORDER — ALBUTEROL SULFATE HFA 108 (90 BASE) MCG/ACT IN AERS: 2.0000 | INHALATION_SPRAY | RESPIRATORY_TRACT | 1 refills | Status: DC | PRN

## 2022-10-31 MED ORDER — TRELEGY ELLIPTA 200-62.5-25 MCG/ACT IN AEPB: INHALATION_SPRAY | RESPIRATORY_TRACT | 5 refills | Status: DC

## 2022-10-31 MED ORDER — AZITHROMYCIN 250 MG PO TABS: ORAL_TABLET | ORAL | 5 refills | Status: DC

## 2022-10-31 MED ORDER — FLUTICASONE PROPIONATE 50 MCG/ACT NA SUSP: 1.0000 | Freq: Every day | NASAL | 5 refills | Status: DC | PRN

## 2022-10-31 NOTE — Patient Instructions (Addendum)
1. Moderate persistent asthma, uncomplicated - Lung testing looks great today.  - Daily controller medication(s): Trelegy one puff once daily + Singulair 10mg  daily - Prior to physical activity: albuterol 2 puffs 10-15 minutes before physical activity. - Rescue medications: albuterol 4 puffs every 4-6 hours as needed - Asthma control goals:  * Full participation in all desired activities (may need albuterol before activity) * Albuterol use two time or less a week on average (not counting use with activity) * Cough interfering with sleep two time or less a month * Oral steroids no more than once a year * No hospitalizations  2. Allergic rhinitis - with overlying sinusitis  - Continue with Allegra one tablet once daily as needed. - Continue with Flonase one spray per nostril daily as needed.   3. Recurrent infections - We will continue with the azithromycin 250mg  three times weekly.  - I think we found a sweet spot.  - The azithromycin is used for controlling inflammation in severe asthmatics as well, so we are killing two birds with one stone.    4. Return in about 6 months (around 05/03/2023).    Please inform us of any Emergency Department visits, hospitalizations, or changes in symptoms. Call us before going to the ED for breathing or allergy symptoms since we might be able to fit you in for a sick visit. Feel free to contact us anytime with any questions, problems, or concerns.  It was a pleasure to see you again today!   Websites that have reliable patient information: 1. American Academy of Asthma, Allergy, and Immunology: www.aaaai.org 2. Food Allergy Research and Education (FARE): foodallergy.org 3. Mothers of Asthmatics: http://www.asthmacommunitynetwork.org 4. American College of Allergy, Asthma, and Immunology: www.acaai.org  "Like" [place] on Facebook and Instagram for our latest updates!       Make sure you are registered to vote! If you have moved or changed any  of your contact information, you will need to get this updated before voting!  In some cases, you MAY be able to register to vote online: [email or web address]

## 2022-10-31 NOTE — Progress Notes (Signed)
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 Moderate persistent asthma, uncomplicated - Lung testing looks great today.  - Daily controller medication(s): Trelegy one puff once daily + Singulair 10mg  daily - Prior to physical activity: albuterol 2 puffs 10-15 minutes before physical activity. - Rescue medications: albuterol 4 puffs every 4-6 hours as needed - Asthma control goals:  * Full participation in all desired activities (may need albuterol before activity) * Albuterol use two time or less a week on average (not counting use with activity) * Cough interfering with sleep two time or less a month * Oral steroids no more than once a year * No hospitalizations  2. Allergic rhinitis - with overlying sinusitis  - Continue with Allegra one tablet once daily as needed. - Continue with Flonase one spray per nostril daily as needed.   3. Recurrent infections - We will continue with the azithromycin 250mg  three times weekly.  - I think we found a sweet spot.  - The azithromycin is used for controlling inflammation in severe asthmatics as well, so we are killing two birds with one stone.  - We could consider stopping the azithromycin for the summer months, but I prefer to keep it on board for now since she is doing so well. - I definitely want her to have it during the cold in the flu season.   4. Return in about 6 months  (around 05/03/2023).    Subjective:   Mariah [name] is a 37 y.o. female presenting today for follow up of  Chief Complaint  Patient presents with   Asthma   Follow-up    Mariah [name] has a history of the following: Patient Active Problem List   Diagnosis Date Noted   Moderate persistent asthma, uncomplicated 08/22/2022   Gastroesophageal reflux disease 07/24/2021   Normal labor 08/07/2020   Pruritus of vagina 07/01/2019   URI (upper respiratory infection) 01/28/2017   Missed period 01/28/2017   Encounter to establish care 05/04/2015   Motion sickness 05/04/2015   Chronic urticaria 02/25/2015   Rash and nonspecific skin eruption 09/21/2012   Left knee pain 08/02/2011   Contraception management 05/13/2011   PAP SMEAR, ABNORMAL 04/27/2009   SYNCOPE 02/08/2009   Other allergic rhinitis 12/21/2008   Recurrent infections 08/21/2007   Generalized anxiety disorder 03/11/2007   PANIC ATTACK 03/11/2007    History obtained from: chart review and patient.  Mariah [name] is a 37 y.o. female presenting for a follow up visit.  She was last seen in February 2024.  At that time, she was doing very well with Trelegy 200 mcg 1 puff daily as well as Singulair.  For her allergic rhinitis, she continued on Allegra as well as Flonase.  We did start cefdinir 300 mg twice daily for sinusitis.  We also added on Vigamox twice daily for 10 days.  For her history of recurrent infections, we obtained repeat streptococcal titers.  Unfortunately, these repeat titers  showed decrease in protection and had Streptococcus pneumonia.  We ended up starting azithromycin 500 mg 3 times a week.  She ended up in decreasing to 250 mg 3 times a week.  She did get a yeast infection.  She was having side effects to the 500 mg dose.  Since last visit, she has done well. 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   Asthma/Respiratory Symptom History: She has been on the Trelegy which is helping quite a  bit. She has not needed the albuterol at all.  She feels that her symptoms are under good control.  She has not been on prednisone since we saw her last time.  She remains on the Trelegy 1 puff once daily which is controlling her symptoms very adequately.  She has not been to the emergency room.  She denies any nighttime coughing or wheezing.  Allergic Rhinitis Symptom History: She remains on Allegra 1 tablet daily as well as Flonase as needed.  She has not been on any antibiotics for any sinus infections.  She has been doing the azithromycin 250 mg 3 times weekly.  She feels excellent on this has not felt this good in quite some time.  She has not needed any antibiotics.  She remains on the azithromycin 250mg  three times weekly. This seems to be helping to prevent her from getting infections. She feels 90% better than she did the last time we saw each other. She is not having anything requiring additional antibiotics at all.   She did stop her Lexapro.  She was trying to simplify her medication regimen. Her grandmother died on 06/24/2022.  This has been hard for her, but she apparently had a lot of medical conditions.  She is planmning to go to several concerts this summer, including [name], and [employer] [name].   Otherwise, there have been no changes to her past medical history, surgical history, family history, or social history.    Review of Systems  Constitutional: Negative.  Negative for chills, fever, malaise/fatigue and weight loss.  HENT:  Positive for congestion. Negative for ear discharge, ear pain and sinus pain.   Eyes:  Negative for pain, discharge and redness.  Respiratory:  Negative for cough, sputum production, shortness of breath and wheezing.   Cardiovascular: Negative.  Negative for chest pain and palpitations.  Gastrointestinal:  Negative for abdominal pain, constipation, diarrhea, heartburn, nausea and vomiting.  Skin: Negative.  Negative for itching and  rash.  Neurological:  Negative for dizziness and headaches.  Endo/Heme/Allergies:  Positive for environmental allergies. Does not bruise/bleed easily.       Objective:   Height 5' 1.81" (1.57 m), weight 150 lb (68 kg). Body mass index is 27.6 kg/m.    Physical Exam Vitals reviewed.  Constitutional:      Appearance: She is well-developed.     Comments: Talkative.  HENT:     Head: Normocephalic and atraumatic.     Right Ear: Tympanic membrane, ear canal and external ear normal.     Left Ear: Tympanic membrane, ear canal and external ear normal.     Nose: No nasal deformity, septal deviation, mucosal edema or rhinorrhea.     Right Turbinates: Enlarged, swollen and pale.     Left Turbinates: Enlarged, swollen and pale.     Right Sinus: No maxillary sinus tenderness or frontal sinus tenderness.     Left Sinus: No maxillary sinus tenderness or frontal sinus tenderness.     Mouth/Throat:  Mouth: Mucous membranes are not pale and not dry.     Pharynx: Uvula midline.     Comments: Cobblestoning present in the posterior oropharynx.  Eyes:     General: Lids are normal. Allergic shiner present.        Right eye: No discharge.        Left eye: No discharge.     Conjunctiva/sclera: Conjunctivae normal.     Right eye: Right conjunctiva is not injected. No chemosis.    Left eye: Left conjunctiva is not injected. No chemosis.    Pupils: Pupils are equal, round, and reactive to light.  Cardiovascular:     Rate and Rhythm: Normal rate and regular rhythm.     Heart sounds: Normal heart sounds.  Pulmonary:     Effort: Pulmonary effort is normal. No tachypnea, accessory muscle usage or respiratory distress.     Breath sounds: Normal breath sounds. No wheezing, rhonchi or rales.     Comments: Moving [name] well in all lung fields.  No increased work of breathing. Chest:     Chest wall: No tenderness.  Lymphadenopathy:     Cervical: No cervical adenopathy.  Skin:    General: Skin is  warm.     Capillary Refill: Capillary refill takes less than 2 seconds.     Coloration: Skin is not pale.     Findings: No abrasion, erythema, petechiae or rash. Rash is not papular, urticarial or vesicular.     Comments: No eczematous or urticarial lesions noted.  Neurological:     Mental Status: She is alert.  Psychiatric:        Behavior: Behavior is cooperative.      Diagnostic studies:    Spirometry: results normal (FEV1: 2.84/101%, FVC: 3.20/95%, FEV1/FVC: 89%).    Spirometry consistent with normal pattern.    Allergy Studies: none        Malachi Bonds, MD  [name] [place]

## 2022-11-25 ENCOUNTER — Other Ambulatory Visit: Payer: Self-pay | Admitting: Allergy & Immunology

## 2023-01-27 ENCOUNTER — Emergency Department (HOSPITAL_BASED_OUTPATIENT_CLINIC_OR_DEPARTMENT_OTHER)
Admission: EM | Admit: 2023-01-27 | Discharge: 2023-01-27 | Disposition: A | Discharge status: Home / Self Care | Payer: PRIVATE HEALTH INSURANCE | Attending: Emergency Medicine | Admitting: Emergency Medicine

## 2023-01-27 ENCOUNTER — Encounter (HOSPITAL_BASED_OUTPATIENT_CLINIC_OR_DEPARTMENT_OTHER): Payer: Self-pay

## 2023-01-27 ENCOUNTER — Other Ambulatory Visit: Payer: Self-pay

## 2023-01-27 ENCOUNTER — Emergency Department (HOSPITAL_BASED_OUTPATIENT_CLINIC_OR_DEPARTMENT_OTHER): Payer: Self-pay

## 2023-01-27 DIAGNOSIS — Z7951 Long term (current) use of inhaled steroids: Secondary | ICD-10-CM | POA: Diagnosis not present

## 2023-01-27 DIAGNOSIS — R059 Cough, unspecified: Secondary | ICD-10-CM | POA: Diagnosis not present

## 2023-01-27 DIAGNOSIS — Z20822 Contact with and (suspected) exposure to covid-19: Secondary | ICD-10-CM | POA: Insufficient documentation

## 2023-01-27 DIAGNOSIS — J45909 Unspecified asthma, uncomplicated: Secondary | ICD-10-CM | POA: Diagnosis not present

## 2023-01-27 DIAGNOSIS — R42 Dizziness and giddiness: Secondary | ICD-10-CM | POA: Insufficient documentation

## 2023-01-27 DIAGNOSIS — R0602 Shortness of breath: Secondary | ICD-10-CM | POA: Diagnosis present

## 2023-01-27 DIAGNOSIS — B9789 Other viral agents as the cause of diseases classified elsewhere: Secondary | ICD-10-CM

## 2023-01-27 LAB — CBC WITH DIFFERENTIAL/PLATELET
Abs Immature Granulocytes: 0.05 10*3/uL (ref 0.00–0.07)
Basophils Absolute: 0 10*3/uL (ref 0.0–0.1)
Basophils Relative: 0 %
Eosinophils Absolute: 0.2 10*3/uL (ref 0.0–0.5)
Eosinophils Relative: 1 %
HCT: 39.9 % (ref 36.0–46.0)
Hemoglobin: 13.1 g/dL (ref 12.0–15.0)
Immature Granulocytes: 0 %
Lymphocytes Relative: 18 %
Lymphs Abs: 2.6 10*3/uL (ref 0.7–4.0)
MCH: 26.3 pg (ref 26.0–34.0)
MCHC: 32.8 g/dL (ref 30.0–36.0)
MCV: 80 fL (ref 80.0–100.0)
Monocytes Absolute: 0.8 10*3/uL (ref 0.1–1.0)
Monocytes Relative: 5 %
Neutro Abs: 11 10*3/uL — ABNORMAL HIGH (ref 1.7–7.7)
Neutrophils Relative %: 76 %
Platelets: 386 10*3/uL (ref 150–400)
RBC: 4.99 MIL/uL (ref 3.87–5.11)
RDW: 13.3 % (ref 11.5–15.5)
WBC: 14.6 10*3/uL — ABNORMAL HIGH (ref 4.0–10.5)
nRBC: 0 % (ref 0.0–0.2)

## 2023-01-27 LAB — COMPREHENSIVE METABOLIC PANEL
ALT: 14 U/L (ref 0–44)
AST: 12 U/L — ABNORMAL LOW (ref 15–41)
Albumin: 4.3 g/dL (ref 3.5–5.0)
Alkaline Phosphatase: 73 U/L (ref 38–126)
Anion gap: 13 (ref 5–15)
BUN: 13 mg/dL (ref 6–20)
CO2: 24 mmol/L (ref 22–32)
Calcium: 9.1 mg/dL (ref 8.9–10.3)
Chloride: 101 mmol/L (ref 98–111)
Creatinine, Ser: 0.74 mg/dL (ref 0.44–1.00)
GFR, Estimated: >60 mL/min (ref >60)
Glucose, Bld: 86 mg/dL (ref 70–99)
Potassium: 3.5 mmol/L (ref 3.5–5.1)
Sodium: 138 mmol/L (ref 135–145)
Total Bilirubin: 0.4 mg/dL (ref 0.3–1.2)
Total Protein: 7.1 g/dL (ref 6.5–8.1)

## 2023-01-27 LAB — RESP PANEL BY RT-PCR (RSV, FLU A&B, COVID)  RVPGX2
Influenza A by PCR: NEGATIVE
Influenza B by PCR: NEGATIVE
Resp Syncytial Virus by PCR: NEGATIVE
SARS Coronavirus 2 by RT PCR: NEGATIVE

## 2023-01-27 LAB — PREGNANCY, URINE: Preg Test, Ur: NEGATIVE

## 2023-01-27 MED ORDER — METHYLPREDNISOLONE SODIUM SUCC 125 MG IJ SOLR
125.0000 mg | Freq: Once | INTRAMUSCULAR | Status: AC
  Administered 2023-01-27: 125 mg via INTRAVENOUS
  Filled 2023-01-27: qty 2

## 2023-01-27 MED ORDER — SODIUM CHLORIDE 0.9 % IV BOLUS
1000.0000 mL | Freq: Once | INTRAVENOUS | Status: AC
  Administered 2023-01-27: 1000 mL via INTRAVENOUS

## 2023-01-27 MED ORDER — PREDNISONE 10 MG PO TABS: 10.0000 mg | ORAL_TABLET | Freq: Every day | ORAL | 0 refills | Status: AC

## 2023-01-27 MED ORDER — IOHEXOL 350 MG/ML SOLN
100.0000 mL | Freq: Once | INTRAVENOUS | Status: AC | PRN
  Administered 2023-01-27: 75 mL via INTRAVENOUS

## 2023-01-27 MED ORDER — IPRATROPIUM-ALBUTEROL 0.5-2.5 (3) MG/3ML IN SOLN
3.0000 mL | Freq: Once | RESPIRATORY_TRACT | Status: AC
  Administered 2023-01-27: 3 mL via RESPIRATORY_TRACT
  Filled 2023-01-27: qty 3

## 2023-01-27 NOTE — ED Provider Notes (Signed)
[place] EMERGENCY DEPARTMENT AT [facility] Provider Note   CSN: [id number] Arrival date & time: 01/27/23  1414     History  Chief Complaint  Patient presents with   Shortness of Breath    Mariah [name] is a 37 y.o. female, history of multifocal pneumonia, asthma, anxiety, who presents to the ED secondary to shortness of breath, nonproductive cough, lightheadedness, that began about 3 days ago.  She states she has been puffing her albuterol, Trelegy, budesonide inhalers without relief.  She is using them 4-6 times a day, does not feel any relief.  She notes that feels very short of breath and feels some chest tightness.  Denies any fevers, chills, but does state that she had some URI symptoms earlier this week.  She is on birth control.  Home Medications Prior to Admission medications   Medication Sig Start Date End Date Taking? Authorizing Provider  albuterol (VENTOLIN HFA) 108 (90 Base) MCG/ACT inhaler INHALE 2 PUFFS INTO LUNGS EVERY 4 HOURS AS NEEDED FOR WHEEZING OR SHORTNESS OF BREATH 11/25/22  Yes Alfonse Spruce, MD  fluticasone [facility]) 50 MCG/ACT nasal spray Place 1 spray into both nostrils daily as needed for allergies or rhinitis. 10/31/22  Yes Alfonse Spruce, MD  Fluticasone-Umeclidin-Vilant (TRELEGY ELLIPTA) 200-62.5-25 MCG/ACT AEPB INHALE 1 PUFF ONCE DAILY TO PREVENT COUGH AND WHEEZE, RINSE MOUTH AFTER 10/31/22  Yes Alfonse Spruce, MD  montelukast (SINGULAIR) 10 MG tablet TAKE 1 TABLET BY MOUTH AT BEDTIME 10/17/22  Yes Alfonse Spruce, MD  acetaminophen (TYLENOL) 500 MG tablet Take 500 mg by mouth every 6 (six) hours as needed for mild pain or headache.    [provider]  azithromycin (ZITHROMAX) 250 MG tablet Take one tablet every Monday, Wednesday, Friday 10/31/22   Alfonse Spruce, MD  cetirizine (ZYRTEC) 10 MG tablet Take 10 mg by mouth daily as needed for allergies.    [provider]  drospirenone-ethinyl estradiol  (YAZ) 3-0.02 MG tablet Take 1 tablet by mouth daily. 06/26/21   [provider]  ondansetron (ZOFRAN) 4 MG tablet Take 1 tablet (4 mg total) by mouth every 8 (eight) hours as needed for nausea or vomiting. 09/18/22   Nehemiah Settle, FNP  tretinoin (RETIN-A) 0.025 % cream tretinoin 0.025 % topical cream  APPLY TO AFFECTED AREA ON FACE EVERY EVENING    [provider]  fexofenadine (ALLEGRA) 180 MG tablet Take 180 mg by mouth daily. Reported on 08/18/2015  09/30/19  [provider]      Allergies    Omnicef [cefdinir], Codeine, and Vicodin [hydrocodone-acetaminophen]    Review of Systems   Review of Systems  Constitutional:  Negative for fever.  Respiratory:  Positive for shortness of breath.   Cardiovascular:  Positive for chest pain.    Physical Exam Updated Vital Signs BP 134/80   Pulse 81   Temp 98.2 F (36.8 C)   Resp 20   Ht 5\' 2"  (1.575 m)   Wt 63.5 kg   SpO2 100%   BMI 25.61 kg/m  Physical Exam Vitals and nursing note reviewed.  Constitutional:      General: She is not in acute distress.    Appearance: She is well-developed.  HENT:     Head: Normocephalic and atraumatic.  Eyes:     Conjunctiva/sclera: Conjunctivae normal.  Cardiovascular:     Rate and Rhythm: Normal rate and regular rhythm.     Heart sounds: No murmur heard. Pulmonary:     Effort:  Pulmonary effort is normal. No respiratory distress.     Breath sounds: Decreased breath sounds present. No wheezing.  Abdominal:     Palpations: Abdomen is soft.     Tenderness: There is no abdominal tenderness.  Musculoskeletal:        General: No swelling.     Cervical back: Neck supple.  Skin:    General: Skin is warm and dry.     Capillary Refill: Capillary refill takes less than 2 seconds.  Neurological:     Mental Status: She is alert.  Psychiatric:        Mood and Affect: Mood normal.     ED Results / Procedures / Treatments   Labs (all labs ordered are listed, but only  abnormal results are displayed) Labs Reviewed  RESP PANEL BY RT-PCR (RSV, FLU A&B, COVID)  [id number]  CBC WITH DIFFERENTIAL/PLATELET  COMPREHENSIVE METABOLIC PANEL  PREGNANCY, URINE    EKG None  Radiology DG Chest Portable 1 View  Result Date: 01/27/2023 CLINICAL DATA:  One to two week history of shortness of breath, nonproductive cough EXAM: PORTABLE CHEST 1 VIEW COMPARISON:  Chest radiograph dated 07/30/2022 FINDINGS: Normal lung volumes. No focal consolidations. No pleural effusion or pneumothorax. The heart size and mediastinal contours are within normal limits. No acute osseous abnormality. IMPRESSION: No active disease. Electronically Signed   By: Agustin Cree M.D.   On: 01/27/2023 16:26    Procedures Procedures    Medications Ordered in ED Medications  methylPREDNISolone sodium succinate (SOLU-MEDROL) 125 mg/2 mL injection 125 mg (has no administration in time range)  sodium chloride 0.9 % bolus 1,000 mL (has no administration in time range)  ipratropium-albuterol (DUONEB) 0.5-2.5 (3) MG/3ML nebulizer solution 3 mL (has no administration in time range)    ED Course/ Medical Decision Making/ A&P                                 Medical Decision Making Patient is a 37 year old female, here for shortness of breath that acutely came on 3 days ago.  She did have a cold last week, and was concerned she may have multifocal pneumonia.  She states that in the past, they have not been able to tell except for on a CAT scan.  Will obtain chest x-ray, and if negative, obtain a CTA, to rule out a PE, and multifocal pneumonia.  Will also obtain EKG for further evaluation.  Labs handed off to [facility] for further evaluation  Amount and/or Complexity of Data Reviewed Labs: ordered. Radiology: ordered.    Details: Chest x-ray clear  Risk Prescription drug management.    Final Clinical Impression(s) / ED Diagnoses Final diagnoses:  None    Rx / DC Orders ED Discharge Orders     None          , Harley Alto, PA 01/27/23 1903    Virgina Norfolk, DO 01/27/23 2019

## 2023-01-27 NOTE — ED Triage Notes (Signed)
In for eval of SOB, dizziness, HA, runny nose, nonproductive cough. Onset 1-2 weeks ago. PMH asthma.

## 2023-01-27 NOTE — ED Provider Notes (Addendum)
  Accepted handoff at shift change from [facility]. Please see prior provider note for more detail.   Briefly: Patient is 37 y.o. "presents to the ED secondary to shortness of breath, nonproductive cough, lightheadedness, that began about 3 days ago. She states she has been puffing her albuterol, Trelegy, budesonide inhalers without relief. She is using them 4-6 times a day, does not feel any relief. She notes that feels very short of breath and feels some chest tightness. Denies any fevers, chills, but does state that she had some URI symptoms earlier this week. She is on birth control."   Plan: - dispo pending CTA -EKG showing sinus rhythm without ST changes or arrhythmias -CTA showing:  1. No pulmonary embolus or acute intrathoracic abnormality. 2. Previous airspace disease has resolved.  - Shared results with patient. Recommended following up with PCP.  Patient endorsed understanding of plan. - Patient afebrile with stable vitals.  Patient initially mildly tachycardic which has resolved with IV fluids. Patient stating that she walked to the bathroom multiple times and felt okay.  - Patient asking for some oral steroids given that she sometimes feels SOB with her anxiety more recently. -Provided with return precautions.  Discharged in good condition.      Dorthy Cooler, [place] 01/27/23 2239    Virgina Norfolk, DO 01/27/23 2314

## 2023-01-27 NOTE — Discharge Instructions (Addendum)
It was a pleasure caring for you today. I have provided information for pulmonologist who you can establish care for your asthma. Seek emergency care if experiencing any new or worsening symptoms.

## 2023-01-28 ENCOUNTER — Ambulatory Visit: Payer: Self-pay | Admitting: Internal Medicine

## 2023-01-31 ENCOUNTER — Other Ambulatory Visit: Payer: Self-pay | Admitting: Allergy & Immunology

## 2023-02-21 ENCOUNTER — Encounter: Payer: Self-pay | Admitting: Internal Medicine

## 2023-02-21 ENCOUNTER — Ambulatory Visit: Payer: PRIVATE HEALTH INSURANCE | Admitting: Internal Medicine

## 2023-02-21 VITALS — BP 110/82 | HR 87 | Ht 61.8 in | Wt 141.8 lb

## 2023-02-21 DIAGNOSIS — Z8619 Personal history of other infectious and parasitic diseases: Secondary | ICD-10-CM

## 2023-02-21 DIAGNOSIS — R0600 Dyspnea, unspecified: Secondary | ICD-10-CM

## 2023-02-21 DIAGNOSIS — R5383 Other fatigue: Secondary | ICD-10-CM

## 2023-02-21 DIAGNOSIS — Z8709 Personal history of other diseases of the respiratory system: Secondary | ICD-10-CM | POA: Diagnosis not present

## 2023-02-21 DIAGNOSIS — Z8701 Personal history of pneumonia (recurrent): Secondary | ICD-10-CM

## 2023-02-21 DIAGNOSIS — R0602 Shortness of breath: Secondary | ICD-10-CM

## 2023-02-21 LAB — CBC WITH DIFFERENTIAL/PLATELET
Basophils Absolute: 0 10*3/uL (ref 0.0–0.1)
Basophils Relative: 0.4 % (ref 0.0–3.0)
Eosinophils Absolute: 0.2 10*3/uL (ref 0.0–0.7)
Eosinophils Relative: 1.5 % (ref 0.0–5.0)
HCT: 40.6 % (ref 36.0–46.0)
Hemoglobin: 13 g/dL (ref 12.0–15.0)
Lymphocytes Relative: 14.3 % (ref 12.0–46.0)
Lymphs Abs: 1.7 10*3/uL (ref 0.7–4.0)
MCHC: 32.1 g/dL (ref 30.0–36.0)
MCV: 80.5 fl (ref 78.0–100.0)
Monocytes Absolute: 0.5 10*3/uL (ref 0.1–1.0)
Monocytes Relative: 4.1 % (ref 3.0–12.0)
Neutro Abs: 9.7 10*3/uL — ABNORMAL HIGH (ref 1.4–7.7)
Neutrophils Relative %: 79.7 % — ABNORMAL HIGH (ref 43.0–77.0)
Platelets: 371 10*3/uL (ref 150.0–400.0)
RBC: 5.04 Mil/uL (ref 3.87–5.11)
RDW: 14 % (ref 11.5–15.5)
WBC: 12.2 10*3/uL — ABNORMAL HIGH (ref 4.0–10.5)

## 2023-02-21 LAB — TSH: TSH: 1.92 u[IU]/mL (ref 0.35–5.50)

## 2023-02-21 NOTE — Progress Notes (Signed)
OV 02/21/2023  Subjective:  Patient ID: Mariah [name], female , DOB: 1986/02/25 , age 37 y.o. , MRN: [id number] , ADDRESS: [address] [place] [address] PCP Patient, No Pcp Per Patient Care Team: Patient, No Pcp Per as PCP - General (General Practice)  This Provider for this visit: Treatment Team:  Attending Provider: Chilton Greathouse, MD    02/21/2023 -   Chief Complaint  Patient presents with   Consult    Consult on asthma     HPI Mariah [name] 37 y.o. -presents for new consultation.  She was seen in the ED on 01/27/2023 for a discharge diagnosis of mild asthma without complication.  CT angiogram chest showed resolved pulmonary infiltrates from a year earlier and no pulmonary embolism.  She has seen Dr. Dellis Anes on 10/31/2018 for the allergy clinic and has a diagnosis of moderate persistent asthma on Trelegy and Singulair along with allergic rhinitis.  She is also on azithromycin 3 times weekly for recurrent infections but she takes it only as needed.  She takes this as needed.  Last visit with him was on 10/31/2022 with a plan to return in 6 months.   She is here today because she is really concerned about her level of symptoms.  She tells me that in January 2023 she picked up RSV infection from a baby and then ended up in the hospital because she could not get rid of it.  CT scan of the chest showed double pneumonia.  Personal visualization shows some bilateral lower lobe infiltrates.  This is absent on the current CT chest in August 2024.  But since then her quality of life has not been the same.  Before that she was only having exercise-induced bronchospasm using albuterol very occasionally not on Trelegy not on Singulair but since then has seen Dr. Dellis Anes is on Trelegy and Singulair and azithromycin.  Things are better but not back at baseline.  She says at baseline she is having shortness of breath and fatigue.  But then when she gets a respiratory infection it  hits a harder and she gets asthma symptoms again she is ended up in the ER.  She is using albuterol a lot more.  In the remote past between 2012 and 2019 she room was having hives and being on Xolair with Dr. Lucie Leather or Dr. Tira Callas.  She has not had allergy testing but her immunoglobulins were normal.  IgE is normal.  She remembers allergy testing over 10 years ago with Dr. [place] Callas skin allergy testing.  She is open for investigations.    CT Chest data from date: see below  - personally visualized and independently interpreted :  - my findings are: AS below She did have a CT angiogram chest 01/27/2023 that showed compared to January 2023 airspace disease had resolved.  No pulmonary embolism. Narrative & Impression  CLINICAL DATA:  Shortness of breath, cough, chest tightness. History of multi lobar pneumonia.   EXAM: CT ANGIOGRAPHY CHEST WITH CONTRAST   TECHNIQUE: Multidetector CT imaging of the chest was performed using the standard protocol during bolus administration of intravenous contrast. Multiplanar CT image reconstructions and MIPs were obtained to evaluate the vascular anatomy.   RADIATION DOSE REDUCTION: This exam was performed according to the departmental dose-optimization program which includes automated exposure control, adjustment of the mA and/or kV according to patient size and/or use of iterative reconstruction technique.   CONTRAST:  75mL OMNIPAQUE IOHEXOL 350 MG/ML SOLN  COMPARISON:  Radiograph earlier today.  Chest CTA 07/24/2021   FINDINGS: Cardiovascular: There are no filling defects within the pulmonary arteries to suggest pulmonary embolus. The heart is normal in size normal thoracic aorta without dissection or acute findings. No pericardial effusion.   Mediastinum/Nodes: 10 mm right hilar node, typically reactive. No mediastinal adenopathy. Unremarkable appearance of the esophagus and thyroid gland.   Lungs/Pleura: Clear lungs. Previous airspace  disease has resolved. No new consolidation. No pleural fluid. No features of pulmonary edema. Trachea and central airways are clear.   Upper Abdomen: No acute or unexpected findings.   Musculoskeletal: There are no acute or suspicious osseous abnormalities.   Review of the MIP images confirms the above findings.   IMPRESSION: 1. No pulmonary embolus or acute intrathoracic abnormality. 2. Previous airspace disease has resolved.     Electronically Signed   By: Narda Rutherford M.D.   On: 01/27/2023 20:48      Latest Reference Range & Units 01/28/07 05:21 02/05/07 15:23 08/21/07 22:24 02/08/09 12:57 08/22/14 13:40 09/05/15 08:37 12/30/18 09:12 07/24/21 14:27 01/27/23 19:17  Eosinophils Absolute 0.0 - 0.5 K/uL 0.1 0.2 0.1 1.6 (H) 0.5 0.2 0.5 0.0 0.2  (H): Data is abnormally high PFT      No data to display            LAB RESULTS last 96 hours No results found.  LAB RESULTS last 90 days Recent Results (from the past 2160 hour(s))  Resp panel by RT-PCR (RSV, Flu A&B, Covid) Anterior Nasal Swab     Status: None   Collection Time: 01/27/23  2:35 PM   Specimen: Anterior Nasal Swab  Result Value Ref Range   SARS Coronavirus 2 by RT PCR NEGATIVE NEGATIVE    Comment: (NOTE) SARS-CoV-2 target nucleic acids are NOT DETECTED.  The SARS-CoV-2 RNA is generally detectable in upper respiratory specimens during the acute phase of infection. The lowest concentration of SARS-CoV-2 viral copies this assay can detect is 138 copies/mL. A negative result does not preclude SARS-Cov-2 infection and should not be used as the sole basis for treatment or other patient management decisions. A negative result may occur with  improper specimen collection/handling, submission of specimen other than nasopharyngeal swab, presence of viral mutation(s) within the areas targeted by this assay, and inadequate number of viral copies(<138 copies/mL). A negative result must be combined with clinical  observations, patient history, and epidemiological information. The expected result is Negative.  Fact Sheet for Patients:  BloggerCourse.com  Fact Sheet for Healthcare Providers:  SeriousBroker.it  This test is no t yet approved or cleared by the [place] FDA and  has been authorized for detection and/or diagnosis of SARS-CoV-2 by FDA under an Emergency Use Authorization (EUA). This EUA will remain  in effect (meaning this test can be used) for the duration of the COVID-19 declaration under Section 564(b)(1) of the Act, 21 U.S.C.section 360bbb-3(b)(1), unless the authorization is terminated  or revoked sooner.       Influenza A by PCR NEGATIVE NEGATIVE   Influenza B by PCR NEGATIVE NEGATIVE    Comment: (NOTE) The Xpert Xpress SARS-CoV-2/FLU/RSV plus assay is intended as an aid in the diagnosis of influenza from Nasopharyngeal swab specimens and should not be used as a sole basis for treatment. Nasal washings and aspirates are unacceptable for Xpert Xpress SARS-CoV-2/FLU/RSV testing.  Fact Sheet for Patients: BloggerCourse.com  Fact Sheet for Healthcare Providers: SeriousBroker.it  This test is not yet approved or cleared by the [place]  FDA and has been authorized for detection and/or diagnosis of SARS-CoV-2 by FDA under an Emergency Use Authorization (EUA). This EUA will remain in effect (meaning this test can be used) for the duration of the COVID-19 declaration under Section 564(b)(1) of the Act, 21 U.S.C. section 360bbb-3(b)(1), unless the authorization is terminated or revoked.     Resp Syncytial Virus by PCR NEGATIVE NEGATIVE    Comment: (NOTE) Fact Sheet for Patients: BloggerCourse.com  Fact Sheet for Healthcare Providers: SeriousBroker.it  This test is not yet approved or cleared by the [place]  FDA and has been authorized for detection and/or diagnosis of SARS-CoV-2 by FDA under an Emergency Use Authorization (EUA). This EUA will remain in effect (meaning this test can be used) for the duration of the COVID-19 declaration under Section 564(b)(1) of the Act, 21 U.S.C. section 360bbb-3(b)(1), unless the authorization is terminated or revoked.  Performed at [facility], [address], [place], [place] [address]   Pregnancy, urine     Status: None   Collection Time: 01/27/23  6:36 PM  Result Value Ref Range   Preg Test, Ur NEGATIVE NEGATIVE    Comment:        THE SENSITIVITY OF THIS METHODOLOGY IS >25 mIU/mL. Performed at [facility], [address] [place], [place] [address]   CBC with Differential     Status: Abnormal   Collection Time: 01/27/23  7:17 PM  Result Value Ref Range   WBC 14.6 (H) 4.0 - 10.5 K/uL   RBC 4.99 3.87 - 5.11 MIL/uL   Hemoglobin 13.1 12.0 - 15.0 g/dL   HCT 09.8 11.9 - 14.7 %   MCV 80.0 80.0 - 100.0 fL   MCH 26.3 26.0 - 34.0 pg   MCHC 32.8 30.0 - 36.0 g/dL   RDW 82.9 56.2 - 13.0 %   Platelets 386 150 - 400 K/uL   nRBC 0.0 0.0 - 0.2 %   Neutrophils Relative % 76 %   Neutro Abs 11.0 (H) 1.7 - 7.7 K/uL   Lymphocytes Relative 18 %   Lymphs Abs 2.6 0.7 - 4.0 K/uL   Monocytes Relative 5 %   Monocytes Absolute 0.8 0.1 - 1.0 K/uL   Eosinophils Relative 1 %   Eosinophils Absolute 0.2 0.0 - 0.5 K/uL   Basophils Relative 0 %   Basophils Absolute 0.0 0.0 - 0.1 K/uL   Immature Granulocytes 0 %   Abs Immature Granulocytes 0.05 0.00 - 0.07 K/uL    Comment: Performed at [facility], [address] [place], [place] [address]  Comprehensive metabolic panel     Status: Abnormal   Collection Time: 01/27/23  7:17 PM  Result Value Ref Range   Sodium 138 135 - 145 mmol/L   Potassium 3.5 3.5 - 5.1 mmol/L   Chloride 101 98 - 111 mmol/L   CO2 24 22 - 32 mmol/L   Glucose, Bld 86 70 - 99 mg/dL     Comment: Glucose reference range applies only to samples taken after fasting for at least 8 hours.   BUN 13 6 - 20 mg/dL   Creatinine, Ser 4.69 0.44 - 1.00 mg/dL   Calcium 9.1 8.9 - 62.9 mg/dL   Total Protein 7.1 6.5 - 8.1 g/dL   Albumin 4.3 3.5 - 5.0 g/dL   AST 12 (L) 15 - 41 U/L   ALT 14 0 - 44 U/L   Alkaline Phosphatase 73 38 - 126 U/L   Total Bilirubin 0.4 0.3 - 1.2  mg/dL   GFR, Estimated >16 >10 mL/min    Comment: (NOTE) Calculated using the CKD-EPI Creatinine Equation (2021)    Anion gap 13 5 - 15    Comment: Performed at [facility], [address] [place], [place] [address]         has a past medical history of Allergy, Anxiety state, unspecified, Asthma, Panic disorder without agoraphobia, and Urticaria.   reports that she has never smoked. She has never used smokeless tobacco.  Past Surgical History:  Procedure Laterality Date   TONSILLECTOMY     01/26/07- Dr. Jenne Pane   WISDOM TOOTH EXTRACTION      Allergies  Allergen Reactions   Omnicef [Cefdinir] Nausea And Vomiting   Codeine Nausea Only and Other (See Comments)    Passes out   Vicodin [Hydrocodone-Acetaminophen] Nausea Only and Other (See Comments)    Passes out    Immunization History  Administered Date(s) Administered   Hpv-Unspecified 01/15/2006, 03/06/2006, 09/03/2006   Influenza Inj Mdck Quad Pf 04/20/2020   Influenza Whole 05/22/2004, 03/29/2009   Influenza-Unspecified 04/23/2015, 04/13/2019   Meningococcal polysaccharide vaccine (MPSV4) 01/30/2005   Td 02/24/2003   Tdap 09/05/2015, 05/25/2020    Family History  Problem Relation Age of Onset   Hypertension Father    Drug abuse Father    Arthritis Father    Crohn's disease Father    Depression Mother    Diabetes Other    Cancer Other    Stroke Other    Congestive Heart Failure Paternal Grandmother      Current Outpatient Medications:    acetaminophen (TYLENOL) 500 MG tablet, Take 500 mg by mouth every 6 (six)  hours as needed for mild pain or headache., Disp: , Rfl:    albuterol (VENTOLIN HFA) 108 (90 Base) MCG/ACT inhaler, INHALE 2 PUFFS INTO LUNGS EVERY 4 HOURS AS NEEDED FOR WHEEZING OR SHORTNESS OF BREATH, Disp: 8.5 g, Rfl: 1   cetirizine (ZYRTEC) 10 MG tablet, Take 10 mg by mouth daily as needed for allergies., Disp: , Rfl:    drospirenone-ethinyl estradiol (YAZ) 3-0.02 MG tablet, Take 1 tablet by mouth daily., Disp: , Rfl:    fluticasone (FLONASE) 50 MCG/ACT nasal spray, Place 1 spray into both nostrils daily as needed for allergies or rhinitis., Disp: 16 g, Rfl: 5   Fluticasone-Umeclidin-Vilant (TRELEGY ELLIPTA) 200-62.5-25 MCG/ACT AEPB, INHALE 1 PUFF ONCE DAILY TO PREVENT COUGH AND WHEEZE, RINSE MOUTH AFTER, Disp: 60 each, Rfl: 5   montelukast (SINGULAIR) 10 MG tablet, TAKE 1 TABLET BY MOUTH NIGHTLY AT BEDTIME, Disp: 30 tablet, Rfl: 2   ondansetron (ZOFRAN) 4 MG tablet, Take 1 tablet (4 mg total) by mouth every 8 (eight) hours as needed for nausea or vomiting., Disp: 10 tablet, Rfl: 0   tretinoin (RETIN-A) 0.025 % cream, tretinoin 0.025 % topical cream  APPLY TO AFFECTED AREA ON FACE EVERY EVENING, Disp: , Rfl:    azithromycin (ZITHROMAX) 250 MG tablet, Take one tablet every Monday, Wednesday, Friday (Patient not taking: Reported on 02/21/2023), Disp: 12 tablet, Rfl: 5      Objective:   Vitals:   02/21/23 0916  BP: 110/82  Pulse: 87  SpO2: 99%  Weight: 141 lb 12.8 oz (64.3 kg)  Height: 5' 1.8" (1.57 m)    Estimated body mass index is 26.1 kg/m as calculated from the following:   Height as of this encounter: 5' 1.8" (1.57 m).   Weight as of this encounter: 141 lb 12.8 oz (64.3 kg).  @WEIGHTCHANGE @  [facility]  Weights   02/21/23 0916  Weight: 141 lb 12.8 oz (64.3 kg)     Physical Exam   General: No distress. Looks well O2 at rest: no Cane present: no Sitting in wheel chair: no Frail: no Obese: no Neuro: Alert and Oriented x 3. GCS 15. Speech normal Psych: Pleasant Resp:   Barrel Chest - no.  Wheeze - no, Crackles - no, No overt respiratory distress CVS: Normal heart sounds. Murmurs - no Ext: Stigmata of Connective Tissue Disease - no HEENT: Normal upper airway. PEERL +. No post nasal drip        Assessment:       ICD-10-CM   1. History of respiratory syncytial virus (RSV) infection  Z86.19 TSH    CBC w/Diff    IgE    Perennial allergen profile IgE    Pulmonary function test    2. History of pneumonia  Z87.01     3. History of asthma  Z87.09     4. Other fatigue  R53.83     5. Shortness of breath  R06.02     6. Dyspnea, unspecified type  R06.00 ECHOCARDIOGRAM COMPLETE         Plan:     Patient Instructions     ICD-10-CM   1. History of respiratory syncytial virus (RSV) infection  Z86.19     2. History of pneumonia  Z87.01     3. History of asthma  Z87.09     4. Other fatigue  R53.83     5. Shortness of breath  R06.02       Plan  - check TSH , cortisol, Vitamin D levels - check Echo - check cbc with diff, IgE and RAST alleergy panel - check full PFT (no need to wait for this for followup)  Followup  - app video visit in 3-4 weeks to discuss teest result   - if non contributory consider CPST with EIB challenge   FOLLOWUP Return in about 4 weeks (around 03/21/2023) for 15 min visit, with any of the APPS, VIDEO VISIT.    SIGNATURE    Dr. Kalman Shan, M.D., F.C.C.P,  Pulmonary and Critical Care Medicine Staff Physician, [facility] Center Director - Interstitial Lung Disease  Program  Pulmonary Fibrosis [facility] at [facility] [place], [place], [address]  Pager: [phone], If no answer or between  15:00h - 7:00h: call [phone] Telephone: [phone]  9:54 AM 02/21/2023

## 2023-02-21 NOTE — Patient Instructions (Addendum)
ICD-10-CM   1. History of respiratory syncytial virus (RSV) infection  Z86.19     2. History of pneumonia  Z87.01     3. History of asthma  Z87.09     4. Other fatigue  R53.83     5. Shortness of breath  R06.02       Plan  - check TSH , cortisol, Vitamin D levels - check Echo - check cbc with diff, IgE and RAST alleergy panel - check full PFT (no need to wait for this for followup)  Followup  - app video visit in 3-4 weeks to discuss teest result   - if non contributory consider CPST with EIB challenge

## 2023-02-25 LAB — ALLERGEN PROFILE, PERENNIAL ALLERGEN IGE
Alternaria Alternata IgE: <0.1 kU/L
Aspergillus Fumigatus IgE: <0.1 kU/L
Aureobasidi Pullulans IgE: <0.1 kU/L
Candida Albicans IgE: <0.1 kU/L
Cat Dander IgE: 1.91 kU/L — AB
Chicken Feathers IgE: <0.1 kU/L
Cladosporium Herbarum IgE: <0.1 kU/L
Cow Dander IgE: <0.1 kU/L
D Farinae IgE: <0.1 kU/L
D Pteronyssinus IgE: <0.1 kU/L
Dog Dander IgE: 2.04 kU/L — AB
Duck Feathers IgE: <0.1 kU/L
Goose Feathers IgE: <0.1 kU/L
Mouse Urine IgE: <0.1 kU/L
Mucor Racemosus IgE: <0.1 kU/L
Penicillium Chrysogen IgE: <0.1 kU/L
Phoma Betae IgE: <0.1 kU/L
Setomelanomma Rostrat: <0.1 kU/L
Stemphylium Herbarum IgE: <0.1 kU/L

## 2023-02-25 LAB — IGE: IgE (Immunoglobulin E), Serum: 60 kU/L (ref <=114)

## 2023-03-10 ENCOUNTER — Other Ambulatory Visit (HOSPITAL_COMMUNITY): Payer: Self-pay

## 2023-03-18 ENCOUNTER — Ambulatory Visit (HOSPITAL_COMMUNITY): Payer: PRIVATE HEALTH INSURANCE | Attending: Internal Medicine

## 2023-03-18 DIAGNOSIS — R0600 Dyspnea, unspecified: Secondary | ICD-10-CM | POA: Insufficient documentation

## 2023-03-18 LAB — ECHOCARDIOGRAM COMPLETE
Area-P 1/2: 4.06 cm2
S' Lateral: 2.6 cm

## 2023-03-18 NOTE — Progress Notes (Signed)
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 Can discuss at followup

## 2023-03-25 ENCOUNTER — Institutional Professional Consult (permissible substitution): Payer: Self-pay | Admitting: Internal Medicine

## 2023-04-07 ENCOUNTER — Other Ambulatory Visit: Payer: Self-pay | Admitting: Allergy & Immunology

## 2023-04-11 ENCOUNTER — Other Ambulatory Visit (HOSPITAL_BASED_OUTPATIENT_CLINIC_OR_DEPARTMENT_OTHER): Payer: Self-pay

## 2023-04-14 ENCOUNTER — Ambulatory Visit: Payer: PRIVATE HEALTH INSURANCE | Admitting: Internal Medicine

## 2023-04-14 ENCOUNTER — Ambulatory Visit (INDEPENDENT_AMBULATORY_CARE_PROVIDER_SITE_OTHER): Payer: PRIVATE HEALTH INSURANCE | Admitting: Internal Medicine

## 2023-04-14 ENCOUNTER — Encounter: Payer: Self-pay | Admitting: Internal Medicine

## 2023-04-14 VITALS — BP 120/68 | HR 97 | Temp 99.1°F | Ht 62.0 in | Wt 138.8 lb

## 2023-04-14 DIAGNOSIS — Z8619 Personal history of other infectious and parasitic diseases: Secondary | ICD-10-CM

## 2023-04-14 DIAGNOSIS — Z8701 Personal history of pneumonia (recurrent): Secondary | ICD-10-CM

## 2023-04-14 DIAGNOSIS — R5383 Other fatigue: Secondary | ICD-10-CM | POA: Diagnosis not present

## 2023-04-14 DIAGNOSIS — R0602 Shortness of breath: Secondary | ICD-10-CM

## 2023-04-14 DIAGNOSIS — Z8709 Personal history of other diseases of the respiratory system: Secondary | ICD-10-CM

## 2023-04-14 LAB — PULMONARY FUNCTION TEST
DL/VA % pred: 122 %
DL/VA: 5.6 ml/min/mmHg/L
DLCO cor % pred: 133 %
DLCO cor: 27.58 ml/min/mmHg
DLCO unc % pred: 131 %
DLCO unc: 27.23 ml/min/mmHg
FEF 25-75 Post: 4.83 L/s
FEF 25-75 Pre: 4.37 L/s
FEF2575-%Change-Post: 10 %
FEF2575-%Pred-Post: 153 %
FEF2575-%Pred-Pre: 138 %
FEV1-%Change-Post: 0 %
FEV1-%Pred-Post: 110 %
FEV1-%Pred-Pre: 109 %
FEV1-Post: 3.2 L
FEV1-Pre: 3.19 L
FEV1FVC-%Change-Post: 0 %
FEV1FVC-%Pred-Pre: 111 %
FEV6-%Change-Post: 0 %
FEV6-%Pred-Post: 100 %
FEV6-%Pred-Pre: 99 %
FEV6-Post: 3.45 L
FEV6-Pre: 3.44 L
FEV6FVC-%Pred-Post: 101 %
FEV6FVC-%Pred-Pre: 101 %
FVC-%Change-Post: 0 %
FVC-%Pred-Post: 98 %
FVC-%Pred-Pre: 98 %
FVC-Post: 3.45 L
FVC-Pre: 3.44 L
Post FEV1/FVC ratio: 93 %
Post FEV6/FVC ratio: 100 %
Pre FEV1/FVC ratio: 93 %
Pre FEV6/FVC Ratio: 100 %
RV % pred: 137 %
RV: 1.93 L
TLC % pred: 106 %
TLC: 5.04 L

## 2023-04-14 NOTE — Progress Notes (Signed)
Full PFT Performed Today  

## 2023-04-14 NOTE — Patient Instructions (Addendum)
ICD-10-CM   1. History of respiratory syncytial virus (RSV) infection  Z86.19     2. History of pneumonia  Z87.01     3. History of asthma  Z87.09     4. Other fatigue  R53.83     5. Shortness of breath  R06.02       -Pulmonary function test is normal.  Mariah [name] 2024] -CT scan of the chest normal [August 2024] - Echocardiogram normal [September 2024] other than the not able to exclude a small patent foramen ovale  -This is a small defect that can represent normally -Positive finding and blood testing is dog and cat dander allergy  Plan  - Reestablished with Mariah [name] in allergy and see if you will benefit from allergy shots  -Let him know daily azithromycin is challenging for your stomach -Do bubble study echocardiogram to see if patent foramen ovale is significant -Do cardiopulmonary stress test with exercise-induced bronchospasm challenge -Continue all your other medications including Trelegy, Flonase, Allegra, Zyrtec and Singulair  Followup  - 8-12 weeks with Mariah [name] or nurse practitioner to review results

## 2023-04-14 NOTE — Patient Instructions (Signed)
Full PFT Performed Today  

## 2023-04-14 NOTE — Progress Notes (Signed)
OV 02/21/2023  Subjective:  Patient ID: Mariah [name], female , DOB: 11-11-1985 , age 37 y.o. , MRN: [id number] , ADDRESS: [address] [place] [address] PCP Patient, No Pcp Per Patient Care Team: Patient, No Pcp Per as PCP - General (General Practice)  This Provider for this visit: Treatment Team:  Attending Provider: Chilton Greathouse, MD    02/21/2023 -   Chief Complaint  Patient presents with   Consult    Consult on asthma     HPI Mariah [name] 37 y.o. -presents for new consultation.  She was seen in the ED on 01/27/2023 for a discharge diagnosis of mild asthma without complication.  CT angiogram chest showed resolved pulmonary infiltrates from a year earlier and no pulmonary embolism.  She has seen Dr. Dellis Anes on 10/31/2018 for the allergy clinic and has a diagnosis of moderate persistent asthma on Trelegy and Singulair along with allergic rhinitis.  She is also on azithromycin 3 times weekly for recurrent infections but she takes it only as needed.  She takes this as needed.  Last visit with him was on 10/31/2022 with a plan to return in 6 months.   She is here today because she is really concerned about her level of symptoms.  She tells me that in January 2023 she picked up RSV infection from a baby and then ended up in the hospital because she could not get rid of it.  CT scan of the chest showed double pneumonia.  Personal visualization shows some bilateral lower lobe infiltrates.  This is absent on the current CT chest in August 2024.  But since then her quality of life has not been the same.  Before that she was only having exercise-induced bronchospasm using albuterol very occasionally not on Trelegy not on Singulair but since then has seen Dr. Dellis Anes is on Trelegy and Singulair and azithromycin.  Things are better but not back at baseline.  She says at baseline she is having shortness of breath and fatigue.  But then when she gets a respiratory infection it  hits a harder and she gets asthma symptoms again she is ended up in the ER.  She is using albuterol a lot more.  In the remote past between 2012 and 2019 she room was having hives and being on Xolair with Dr. Lucie Leather or Dr. [place] Callas.  She has not had allergy testing but her immunoglobulins were normal.  IgE is normal.  She remembers allergy testing over 10 years ago with Dr. [place] Callas skin allergy testing.  She is open for investigations.  OV 04/14/2023  Subjective:  Patient ID: Mariah [name], female , DOB: 1986/03/20 , age 69 y.o. , MRN: [id number] , ADDRESS: [address] [place] [address] PCP Patient, No Pcp Per Patient Care Team: Patient, No Pcp Per as PCP - General (General Practice)  This Provider for this visit: Treatment Team:  Attending Provider: Kalman Shan, MD    04/14/2023 -   Chief Complaint  Patient presents with   Follow-up     HPI Mariah [name] 37 y.o. -`` returns for follow-up.  When I saw her last time she was advised by Dr. Dellis Anes to do her azithromycin on a scheduled basis.  She says while this was helping her it was extreme aching it "rough on my stomach".  Therefore in the last 3 when she has not taken it.  She says she will only take it in response to illness but  this last 3 months he has not been ill.  Overall she is feels stable.  She continues on Zyrtec, Allegra, Flonase, Singulair and Trelegy.  She just states that ever since the RSV infection despite CT scan becoming normal she does not feel normal enough or well enough.  She has multitude of symptoms all documented below in the symptom assessment scale and we can see this a lot of fatigue and some shortness of breath and just feeling nonspecifically bad.  She did have echocardiogram and it is normal but they could not rule out a small PFO.  Blood allergy testing is positive for dog and cat dander.  She says she had a cat like 10 years ago.  The current house where she lives previous owner had  a cat but the house has been completely redone.  She does have a dog that is across between [employer] and sheds a lot the dog is 40 years of age.  I did encourage her to meet with Dr. Dellis Anes to talk about allergy shots.  She says she will do that pulmonary function test is normal although the DLCO is on the higher side which can be a feature and asthma but there is no bronchodilator response.  She is willing to get a pulmonary stress test done.  We did talk about the fact that some of the symptoms could potentially reflect postviral long-haul and she is understanding.     [place] Symptom Assessment Numerical Scale 0 is no problem -> 10 worst problem 04/14/2023    No Pain -> Worst pain 0  No Tiredness -> Worset tiredness 5  No Nausea -> Worst nausea 0  No Depression -> Worst depression 0  No Anxiety -> Worst Anxiety 1  No Drowsiness -> Worst Drowsiness 0  Best appetite-> Worst Appetitle 3  Best Feeling of well being -> Worst feeling 3  No dyspnea-> Worst dyspnea 5  Other problem (none -> severe) x  Completed by  patioent           Latest Reference Range & Units 01/28/07 05:21 02/05/07 15:23 08/21/07 22:24 02/08/09 12:57 08/22/14 13:40 09/05/15 08:37 12/30/18 09:12 07/24/21 14:27 01/27/23 19:17 02/21/23 10:26  Eosinophils Absolute 0.0 - 0.7 K/uL 0.1 0.2 0.1 1.6 (H) 0.5 0.2 0.5 0.0 0.2 0.2  (H): Data is abnormally high  PFT    Latest Ref Rng & Units 04/14/2023    8:59 AM  PFT Results  FVC-Pre L 3.44  P  FVC-Predicted Pre % 98  P  FVC-Post L 3.45  P  FVC-Predicted Post % 98  P  Pre FEV1/FVC % % 93  P  Post FEV1/FCV % % 93  P  FEV1-Pre L 3.19  P  FEV1-Predicted Pre % 109  P  FEV1-Post L 3.20  P  DLCO uncorrected ml/min/mmHg 27.23  P  DLCO UNC% % 131  P  DLCO corrected ml/min/mmHg 27.58  P  DLCO COR %Predicted % 133  P  DLVA Predicted % 122  P  TLC L 5.04  P  TLC % Predicted % 106  P  RV % Predicted % 137  P    P Preliminary result      Latest  Reference Range & Units 02/21/23 10:26  Class Description Allergens  Comment  D Pteronyssinus IgE Class 0 kU/L <0.10  D Farinae IgE Class 0 kU/L <0.10  Cat Dander IgE Class III kU/L 1.91 !  Dog Dander IgE Class III kU/L 2.04 !  Penicillium Chrysogen IgE Class 0 kU/L <0.10  Cladosporium Herbarum IgE Class 0 kU/L <0.10  Aspergillus Fumigatus IgE Class 0 kU/L <0.10  Mucor Racemosus IgE Class 0 kU/L <0.10  Alternaria Alternata IgE Class 0 kU/L <0.10  Stemphylium Herbarum IgE Class 0 kU/L <0.10  Goose Feathers IgE Class 0 kU/L <0.10  Chicken Feathers IgE Class 0 kU/L <0.10  Duck Feathers IgE Class 0 kU/L <0.10  IgE (Immunoglobulin E), Serum <OR=114 kU/L 60  Mouse Urine IgE Class 0 kU/L <0.10  !: Data is abnormal  LAB RESULTS last 96 hours No results found.  LAB RESULTS last 90 days Recent Results (from the past 2160 hour(s))  Resp panel by RT-PCR (RSV, Flu A&B, Covid) Anterior Nasal Swab     Status: None   Collection Time: 01/27/23  2:35 PM   Specimen: Anterior Nasal Swab  Result Value Ref Range   SARS Coronavirus 2 by RT PCR NEGATIVE NEGATIVE    Comment: (NOTE) SARS-CoV-2 target nucleic acids are NOT DETECTED.  The SARS-CoV-2 RNA is generally detectable in upper respiratory specimens during the acute phase of infection. The lowest concentration of SARS-CoV-2 viral copies this assay can detect is 138 copies/mL. A negative result does not preclude SARS-Cov-2 infection and should not be used as the sole basis for treatment or other patient management decisions. A negative result may occur with  improper specimen collection/handling, submission of specimen other than nasopharyngeal swab, presence of viral mutation(s) within the areas targeted by this assay, and inadequate number of viral copies(<138 copies/mL). A negative result must be combined with clinical observations, patient history, and epidemiological information. The expected result is Negative.  Fact Sheet for Patients:   BloggerCourse.com  Fact Sheet for Healthcare Providers:  SeriousBroker.it  This test is no t yet approved or cleared by the [place] FDA and  has been authorized for detection and/or diagnosis of SARS-CoV-2 by FDA under an Emergency Use Authorization (EUA). This EUA will remain  in effect (meaning this test can be used) for the duration of the COVID-19 declaration under Section 564(b)(1) of the Act, 21 U.S.C.section 360bbb-3(b)(1), unless the authorization is terminated  or revoked sooner.       Influenza A by PCR NEGATIVE NEGATIVE   Influenza B by PCR NEGATIVE NEGATIVE    Comment: (NOTE) The Xpert Xpress SARS-CoV-2/FLU/RSV plus assay is intended as an aid in the diagnosis of influenza from Nasopharyngeal swab specimens and should not be used as a sole basis for treatment. Nasal washings and aspirates are unacceptable for Xpert Xpress SARS-CoV-2/FLU/RSV testing.  Fact Sheet for Patients: BloggerCourse.com  Fact Sheet for Healthcare Providers: SeriousBroker.it  This test is not yet approved or cleared by the [place] FDA and has been authorized for detection and/or diagnosis of SARS-CoV-2 by FDA under an Emergency Use Authorization (EUA). This EUA will remain in effect (meaning this test can be used) for the duration of the COVID-19 declaration under Section 564(b)(1) of the Act, 21 U.S.C. section 360bbb-3(b)(1), unless the authorization is terminated or revoked.     Resp Syncytial Virus by PCR NEGATIVE NEGATIVE    Comment: (NOTE) Fact Sheet for Patients: BloggerCourse.com  Fact Sheet for Healthcare Providers: SeriousBroker.it  This test is not yet approved or cleared by the [place] FDA and has been authorized for detection and/or diagnosis of SARS-CoV-2 by FDA under an Emergency Use Authorization (EUA).  This EUA will remain in effect (meaning this test can be used) for the duration of the COVID-19 declaration under Section  564(b)(1) of the Act, 21 U.S.C. section 360bbb-3(b)(1), unless the authorization is terminated or revoked.  Performed at [facility], [address], [place], [place] [address]   Pregnancy, urine     Status: None   Collection Time: 01/27/23  6:36 PM  Result Value Ref Range   Preg Test, Ur NEGATIVE NEGATIVE    Comment:        THE SENSITIVITY OF THIS METHODOLOGY IS >25 mIU/mL. Performed at [facility], [address], [place], [place] [address]   CBC with Differential     Status: Abnormal   Collection Time: 01/27/23  7:17 PM  Result Value Ref Range   WBC 14.6 (H) 4.0 - 10.5 K/uL   RBC 4.99 3.87 - 5.11 MIL/uL   Hemoglobin 13.1 12.0 - 15.0 g/dL   HCT 09.8 11.9 - 14.7 %   MCV 80.0 80.0 - 100.0 fL   MCH 26.3 26.0 - 34.0 pg   MCHC 32.8 30.0 - 36.0 g/dL   RDW 82.9 56.2 - 13.0 %   Platelets 386 150 - 400 K/uL   nRBC 0.0 0.0 - 0.2 %   Neutrophils Relative % 76 %   Neutro Abs 11.0 (H) 1.7 - 7.7 K/uL   Lymphocytes Relative 18 %   Lymphs Abs 2.6 0.7 - 4.0 K/uL   Monocytes Relative 5 %   Monocytes Absolute 0.8 0.1 - 1.0 K/uL   Eosinophils Relative 1 %   Eosinophils Absolute 0.2 0.0 - 0.5 K/uL   Basophils Relative 0 %   Basophils Absolute 0.0 0.0 - 0.1 K/uL   Immature Granulocytes 0 %   Abs Immature Granulocytes 0.05 0.00 - 0.07 K/uL    Comment: Performed at [facility], [address], [place], [place] [address]  Comprehensive metabolic panel     Status: Abnormal   Collection Time: 01/27/23  7:17 PM  Result Value Ref Range   Sodium 138 135 - 145 mmol/L   Potassium 3.5 3.5 - 5.1 mmol/L   Chloride 101 98 - 111 mmol/L   CO2 24 22 - 32 mmol/L   Glucose, Bld 86 70 - 99 mg/dL    Comment: Glucose reference range applies only to samples taken after fasting for at least 8 hours.   BUN 13 6 - 20 mg/dL    Creatinine, Ser 4.69 0.44 - 1.00 mg/dL   Calcium 9.1 8.9 - 62.9 mg/dL   Total Protein 7.1 6.5 - 8.1 g/dL   Albumin 4.3 3.5 - 5.0 g/dL   AST 12 (L) 15 - 41 U/L   ALT 14 0 - 44 U/L   Alkaline Phosphatase 73 38 - 126 U/L   Total Bilirubin 0.4 0.3 - 1.2 mg/dL   GFR, Estimated >52 >84 mL/min    Comment: (NOTE) Calculated using the CKD-EPI Creatinine Equation (2021)    Anion gap 13 5 - 15    Comment: Performed at [facility], [address] [place], [place] [address]  Perennial allergen profile IgE     Status: Abnormal   Collection Time: 02/21/23 10:26 AM  Result Value Ref Range   Class Description Allergens Comment     Comment:     Levels of Specific IgE       Class  Description of Class     ---------------------------  -----  --------------------                    < 0.10         0  Negative            0.10 -    0.31         0/I       Equivocal/Low            0.32 -    0.55         I         Low            0.56 -    1.40         II        Moderate            1.41 -    3.90         III       High            3.91 -   19.00         IV        Very High           19.01 -  100.00         V         Very High                   >100.00         VI        Very High    D Pteronyssinus IgE <0.10 Class 0 kU/L   D Farinae IgE <0.10 Class 0 kU/L   Cat Dander IgE 1.91 (A) Class III kU/L   Dog Dander IgE 2.04 (A) Class III kU/L   Cow Dander IgE <0.10 Class 0 kU/L   Goose Feathers IgE <0.10 Class 0 kU/L   Chicken Feathers IgE <0.10 Class 0 kU/L   Duck Feathers IgE <0.10 Class 0 kU/L   Penicillium Chrysogen IgE <0.10 Class 0 kU/L   Cladosporium Herbarum IgE <0.10 Class 0 kU/L   Aspergillus Fumigatus IgE <0.10 Class 0 kU/L   Mucor Racemosus IgE <0.10 Class 0 kU/L   Candida Albicans IgE <0.10 Class 0 kU/L   Alternaria Alternata IgE <0.10 Class 0 kU/L   Setomelanomma Rostrat <0.10 Class 0 kU/L   Aureobasidi Pullulans IgE <0.10 Class 0 kU/L   Phoma Betae IgE <0.10 Class  0 kU/L   Stemphylium Herbarum IgE <0.10 Class 0 kU/L   Mouse Urine IgE <0.10 Class 0 kU/L  IgE     Status: None   Collection Time: 02/21/23 10:26 AM  Result Value Ref Range   IgE (Immunoglobulin E), Serum 60 <OR=114 kU/L  CBC w/Diff     Status: Abnormal   Collection Time: 02/21/23 10:26 AM  Result Value Ref Range   WBC 12.2 (H) 4.0 - 10.5 K/uL   RBC 5.04 3.87 - 5.11 Mil/uL   Hemoglobin 13.0 12.0 - 15.0 g/dL   HCT 06.3 01.6 - 01.0 %   MCV 80.5 78.0 - 100.0 fl   MCHC 32.1 30.0 - 36.0 g/dL   RDW 93.2 35.5 - 73.2 %   Platelets 371.0 150.0 - 400.0 K/uL   Neutrophils Relative % 79.7 (H) 43.0 - 77.0 %   Lymphocytes Relative 14.3 12.0 - 46.0 %   Monocytes Relative 4.1 3.0 - 12.0 %   Eosinophils Relative 1.5 0.0 - 5.0 %   Basophils Relative 0.4 0.0 - 3.0 %   Neutro Abs 9.7 (H) 1.4 - 7.7 K/uL   Lymphs Abs 1.7 0.7 - 4.0 K/uL   Monocytes Absolute 0.5  0.1 - 1.0 K/uL   Eosinophils Absolute 0.2 0.0 - 0.7 K/uL   Basophils Absolute 0.0 0.0 - 0.1 K/uL  TSH     Status: None   Collection Time: 02/21/23 10:26 AM  Result Value Ref Range   TSH 1.92 0.35 - 5.50 uIU/mL  ECHOCARDIOGRAM COMPLETE     Status: None   Collection Time: 03/18/23  4:28 PM  Result Value Ref Range   Area-P 1/2 4.06 cm2   S' Lateral 2.60 cm   Est EF 60 - 65%   Pulmonary function test     Status: None (Preliminary result)   Collection Time: 04/14/23  8:59 AM  Result Value Ref Range   FVC-Pre 3.44 L   FVC-%Pred-Pre 98 %   FVC-Post 3.45 L   FVC-%Pred-Post 98 %   FVC-%Change-Post 0 %   FEV1-Pre 3.19 L   FEV1-%Pred-Pre 109 %   FEV1-Post 3.20 L   FEV1-%Pred-Post 110 %   FEV1-%Change-Post 0 %   FEV6-Pre 3.44 L   FEV6-%Pred-Pre 99 %   FEV6-Post 3.45 L   FEV6-%Pred-Post 100 %   FEV6-%Change-Post 0 %   Pre FEV1/FVC ratio 93 %   FEV1FVC-%Pred-Pre 111 %   Post FEV1/FVC ratio 93 %   FEV1FVC-%Change-Post 0 %   Pre FEV6/FVC Ratio 100 %   FEV6FVC-%Pred-Pre 101 %   Post FEV6/FVC ratio 100 %   FEV6FVC-%Pred-Post 101 %   FEF  25-75 Pre 4.37 L/sec   FEF2575-%Pred-Pre 138 %   FEF 25-75 Post 4.83 L/sec   FEF2575-%Pred-Post 153 %   FEF2575-%Change-Post 10 %   RV 1.93 L   RV % pred 137 %   TLC 5.04 L   TLC % pred 106 %   DLCO unc 27.23 ml/min/mmHg   DLCO unc % pred 131 %   DLCO cor 27.58 ml/min/mmHg   DLCO cor % pred 133 %   DL/VA 7.82 ml/min/mmHg/L   DL/VA % pred 956 %         has a past medical history of Allergy, Anxiety state, unspecified, Asthma, Panic disorder without agoraphobia, and Urticaria.   reports that she has never smoked. She has never used smokeless tobacco.  Past Surgical History:  Procedure Laterality Date   TONSILLECTOMY     01/26/07- Dr. Jenne Pane   WISDOM TOOTH EXTRACTION      Allergies  Allergen Reactions   Omnicef [Cefdinir] Nausea And Vomiting   Codeine Nausea Only and Other (See Comments)    Passes out   Vicodin [Hydrocodone-Acetaminophen] Nausea Only and Other (See Comments)    Passes out    Immunization History  Administered Date(s) Administered   Hpv-Unspecified 01/15/2006, 03/06/2006, 09/03/2006   Influenza Inj Mdck Quad Pf 04/20/2020   Influenza Whole 05/22/2004, 03/29/2009   Influenza-Unspecified 04/23/2015, 04/13/2019   Meningococcal polysaccharide vaccine (MPSV4) 01/30/2005   Td 02/24/2003   Tdap 09/05/2015, 05/25/2020    Family History  Problem Relation Age of Onset   Hypertension Father    Drug abuse Father    Arthritis Father    Crohn's disease Father    Depression Mother    Diabetes Other    Cancer Other    Stroke Other    Congestive Heart Failure Paternal Grandmother      Current Outpatient Medications:    acetaminophen (TYLENOL) 500 MG tablet, Take 500 mg by mouth every 6 (six) hours as needed for mild pain or headache., Disp: , Rfl:    albuterol (VENTOLIN HFA) 108 (90 Base) MCG/ACT inhaler, INHALE 2 PUFFS  INTO LUNGS EVERY 4 HOURS AS NEEDED FOR WHEEZING OR SHORTNESS OF BREATH, Disp: 8.5 g, Rfl: 1   azithromycin (ZITHROMAX) 250 MG tablet,  Take one tablet every Monday, Wednesday, Friday, Disp: 12 tablet, Rfl: 5   cetirizine (ZYRTEC) 10 MG tablet, Take 10 mg by mouth daily as needed for allergies., Disp: , Rfl:    drospirenone-ethinyl estradiol (YAZ) 3-0.02 MG tablet, Take 1 tablet by mouth daily., Disp: , Rfl:    fluticasone (FLONASE) 50 MCG/ACT nasal spray, Place 1 spray into both nostrils daily as needed for allergies or rhinitis., Disp: 16 g, Rfl: 5   Fluticasone-Umeclidin-Vilant (TRELEGY ELLIPTA) 200-62.5-25 MCG/ACT AEPB, INHALE 1 PUFF ONCE DAILY TO PREVENT COUGH AND WHEEZE, RINSE MOUTH AFTER, Disp: 60 each, Rfl: 5   montelukast (SINGULAIR) 10 MG tablet, TAKE 1 TABLET BY MOUTH AT BEDTIME, Disp: 30 tablet, Rfl: 2   ondansetron (ZOFRAN) 4 MG tablet, Take 1 tablet (4 mg total) by mouth every 8 (eight) hours as needed for nausea or vomiting., Disp: 10 tablet, Rfl: 0   tretinoin (RETIN-A) 0.025 % cream, tretinoin 0.025 % topical cream  APPLY TO AFFECTED AREA ON FACE EVERY EVENING, Disp: , Rfl:       Objective:   Vitals:   04/14/23 1107  BP: 120/68  Pulse: 97  Temp: 99.1 F (37.3 C)  TempSrc: Oral  SpO2: 99%  Weight: 138 lb 12.8 oz (63 kg)  Height: 5\' 2"  (1.575 m)    Estimated body mass index is 25.39 kg/m as calculated from the following:   Height as of this encounter: 5\' 2"  (1.575 m).   Weight as of this encounter: 138 lb 12.8 oz (63 kg).  @WEIGHTCHANGE @  Filed Weights   04/14/23 1107  Weight: 138 lb 12.8 oz (63 kg)     Physical Exam   General: No distress. Looks well O2 at rest: no Cane present: no Sitting in wheel chair: no Frail: no Obese: no Neuro: Alert and Oriented x 3. GCS 15. Speech normal Psych: Pleasant Resp:  Barrel Chest - no.  Wheeze - no, Crackles - no, No overt respiratory distress CVS: Normal heart sounds. Murmurs - no Ext: Stigmata of Connective Tissue Disease - no HEENT: Normal upper airway. PEERL +. No post nasal drip        Assessment:       ICD-10-CM   1. History of  respiratory syncytial virus (RSV) infection  Z86.19 ECHOCARDIOGRAM COMPLETE BUBBLE STUDY    Exercise Tolerance Test    2. History of pneumonia  Z87.01     3. History of asthma  Z87.09     4. Other fatigue  R53.83     5. Shortness of breath  R06.02          Plan:     Patient Instructions     ICD-10-CM   1. History of respiratory syncytial virus (RSV) infection  Z86.19     2. History of pneumonia  Z87.01     3. History of asthma  Z87.09     4. Other fatigue  R53.83     5. Shortness of breath  R06.02       -Pulmonary function test is normal.  Lowella Dandy 2024] -CT scan of the chest normal [August 2024] - Echocardiogram normal [September 2024] other than the not able to exclude a small patent foramen ovale  -This is a small defect that can represent normally -Positive finding and blood testing is dog and cat dander allergy  Plan  - Reestablished  with Dr. Dellis Anes in allergy and see if you will benefit from allergy shots  -Let him know daily azithromycin is challenging for your stomach -Do bubble study echocardiogram to see if patent foramen ovale is significant -Do cardiopulmonary stress test with exercise-induced bronchospasm challenge -Continue all your other medications including Trelegy, Flonase, Allegra, Zyrtec and Singulair  Followup  - 8-12 weeks with Dr. Marchelle Gearing or nurse practitioner to review results   FOLLOWUP Return in about 10 weeks (around 06/23/2023) for 15 min visit, with Dr Marchelle Gearing, Asthma.   SIGNATURE    Dr. Kalman Shan, M.D., F.C.C.P,  Pulmonary and Critical Care Medicine Staff Physician, [facility] Center Director - Interstitial Lung Disease  Program  Pulmonary Fibrosis [facility] at [facility] [place], [place], [address]  Pager: [phone], If no answer or between  15:00h - 7:00h: call [phone] Telephone: [phone]  2:00 PM 04/14/2023

## 2023-04-29 ENCOUNTER — Encounter: Payer: Self-pay | Admitting: Internal Medicine

## 2023-04-30 ENCOUNTER — Telehealth: Payer: Self-pay

## 2023-04-30 DIAGNOSIS — J45990 Exercise induced bronchospasm: Secondary | ICD-10-CM

## 2023-04-30 NOTE — Telephone Encounter (Signed)
Order has been placed to the correct one.

## 2023-05-01 ENCOUNTER — Encounter: Payer: Self-pay | Admitting: Allergy & Immunology

## 2023-05-01 ENCOUNTER — Ambulatory Visit (INDEPENDENT_AMBULATORY_CARE_PROVIDER_SITE_OTHER): Payer: PRIVATE HEALTH INSURANCE | Admitting: Allergy & Immunology

## 2023-05-01 ENCOUNTER — Other Ambulatory Visit: Payer: Self-pay

## 2023-05-01 VITALS — BP 120/80 | HR 87 | Temp 98.3°F | Resp 18 | Ht 61.02 in | Wt 136.3 lb

## 2023-05-01 DIAGNOSIS — J3089 Other allergic rhinitis: Secondary | ICD-10-CM | POA: Diagnosis not present

## 2023-05-01 DIAGNOSIS — K219 Gastro-esophageal reflux disease without esophagitis: Secondary | ICD-10-CM

## 2023-05-01 DIAGNOSIS — J4540 Moderate persistent asthma, uncomplicated: Secondary | ICD-10-CM | POA: Diagnosis not present

## 2023-05-01 DIAGNOSIS — D806 Antibody deficiency with near-normal immunoglobulins or with hyperimmunoglobulinemia: Secondary | ICD-10-CM

## 2023-05-01 MED ORDER — AZITHROMYCIN 250 MG PO TABS: ORAL_TABLET | ORAL | 5 refills | Status: DC

## 2023-05-01 MED ORDER — MONTELUKAST SODIUM 10 MG PO TABS: 10.0000 mg | ORAL_TABLET | Freq: Every day | ORAL | 5 refills | Status: DC

## 2023-05-01 MED ORDER — TRELEGY ELLIPTA 200-62.5-25 MCG/ACT IN AEPB: INHALATION_SPRAY | RESPIRATORY_TRACT | 5 refills | Status: DC

## 2023-05-01 MED ORDER — ALBUTEROL SULFATE HFA 108 (90 BASE) MCG/ACT IN AERS: 2.0000 | INHALATION_SPRAY | RESPIRATORY_TRACT | 1 refills | Status: DC | PRN

## 2023-05-01 MED ORDER — FLUTICASONE PROPIONATE 50 MCG/ACT NA SUSP: 1.0000 | Freq: Every day | NASAL | 5 refills | Status: DC | PRN

## 2023-05-01 NOTE — Progress Notes (Signed)
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 Moderate persistent asthma, uncomplicated - Lung testing looks great today.  - Consider starting Tezspire (handout provided).  - Tammy said that it is on your formulary.  - Daily controller medication(s): Trelegy one puff once daily + Singulair 10mg  daily - Prior to physical activity: albuterol 2 puffs 10-15 minutes before physical activity. - Rescue medications: albuterol 4 puffs every 4-6 hours as needed - Asthma control goals:  * Full participation in all desired activities (may need albuterol before activity) * Albuterol use two time or less a week on average (not counting use with activity) * Cough interfering with sleep two time or less a month * Oral steroids no more than once a year * No hospitalizations  2. Allergic rhinitis - Continue with Allegra one tablet once daily as needed. - Continue with Flonase one spray per nostril daily as needed.   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  - The azithromycin is used for controlling inflammation in severe asthmatics as well, so we are killing two birds with one stone.  - We could consider doing immunoglobulin replacement therapy if the addition of the Tezspire does not help.    4. Return in about 6 weeks (around 06/12/2023).    Subjective:   ELLEANNA [name] is a 37 y.o. female presenting today for follow up of  Chief Complaint  Patient presents with   Asthma    [name] has a history of the following: Patient Active Problem List   Diagnosis Date Noted   Moderate persistent asthma, uncomplicated 08/22/2022   Gastroesophageal reflux disease 07/24/2021   Normal labor 08/07/2020   Pruritus of vagina 07/01/2019   URI (upper respiratory infection) 01/28/2017   Missed period 01/28/2017   Encounter to establish care 05/04/2015   Motion sickness 05/04/2015   Chronic urticaria 02/25/2015   Rash and nonspecific skin eruption 09/21/2012   Left knee pain 08/02/2011   Contraception management 05/13/2011   PAP SMEAR, ABNORMAL 04/27/2009   SYNCOPE 02/08/2009   Other allergic rhinitis 12/21/2008   Recurrent infections 08/21/2007   Generalized anxiety disorder 03/11/2007   PANIC ATTACK 03/11/2007    History obtained from: chart review and patient.  Discussed the use of AI scribe software for clinical note transcription with the patient and/or guardian, who gave verbal consent to proceed.  Mariah [name] is a 37 y.o. female presenting for a follow up visit.  She was last seen in May 2024.  At that time, her lung testing looked great.  We continue with Trelegy 200 mcg 1 puff daily as well as Singulair.  She also continued on albuterol as needed.  For her allergic rhinitis, we continue with Allegra and Flonase.  For her recurrent infections, we continued with azithromycin 2 to 50 mg 3 times a week.  In the interim, it looks like she saw Dr. Marchelle Gearing for  evaluation of her asthma.  During that visit, she had minimal to allergy testing that was positive to cat and dog.  It was recommended that she do a bubble echocardiogram study as well as a cardiopulmonary stress test.  She had a regular echocardiogram in September 2024 that showed a small patent foramen ovale.  Since the last visit, she has had a few more problems.   Asthma/Respiratory Symptom  History: They report having an asthma attack at work a few months ago, which prompted a visit to a pulmonologist. Pulmonary function tests were performed and were reported as normal. An echocardiogram revealed a small congenital heart defect, which is reportedly present in 10% of the population.  She remains on the Trelegy and the montelukast; Dr. Marchelle Gearing did not have any other recommendations regarding asthma management changes.   Allergic Rhinitis Symptom History: The patient also underwent allergy testing via the blood, which revealed significant allergies to cats and dogs. They report no significant exposure to cats. The patient also has a history of receiving allergy shots, but discontinued them due to significant local reactions. They did not feel that these shots significantly improved their respiratory symptoms. They have been on Singulair and Trelegy, which seem to be controlling their symptoms well.  The patient has been frequently ill, but the cause remains undetermined despite extensive immune workup. They received a pneumonia shot, after which their antibody levels decreased, a concerning finding. The patient has been on prophylactic antibiotics, but reports that these medications upset their stomach. They have been using the antibiotics only when feeling unwell, rather than continuously. They report that their illness onset is typically rapid, so they use them for a few days and then stop. We have told her previously that this needs to ideally be done on a more consistent basis.   The patient's health last year was notably poor, but they report currently feeling well and have only needed to use their antibiotic once recently. So overall she feels that she is doing well for the most part. But she is open to changes to help keep her healthier.   We did discuss immunoglobulin treatment, but she would rather work on getting her asthma under better control with a biologic rather than do infusions.  She is very open to administering the injections herself.   Otherwise, there have been no changes to her past medical history, surgical history, family history, or social history.    Review of systems otherwise negative other than that mentioned in the HPI.    Objective:   Blood pressure 120/80, pulse 87, temperature 98.3 F (36.8 C), temperature source Temporal, resp. rate 18, height 5' 1.02" (1.55 m), weight 136 lb 4.8 oz (61.8 kg), SpO2 98%. Body mass index is 25.73 kg/m.    Physical Exam Vitals reviewed.  Constitutional:      Appearance: Normal appearance. She is well-developed.     Comments: Talkative. Well appearing.   HENT:     Head: Normocephalic and atraumatic.     Right Ear: Tympanic membrane, ear canal and external ear normal.     Left Ear: Tympanic membrane, ear canal and external ear normal.     Nose: No nasal deformity, septal deviation, mucosal edema or rhinorrhea.     Right Turbinates: Enlarged, swollen and pale.     Left Turbinates: Enlarged, swollen and pale.     Right Sinus: No maxillary sinus tenderness or frontal sinus tenderness.     Left Sinus: No maxillary sinus tenderness or frontal  sinus tenderness.     Comments: No nasal polyps noted.     Mouth/Throat:     Lips: Pink.     Mouth: Mucous membranes are moist. Mucous membranes are not pale and not dry.     Pharynx: Uvula midline.     Comments: Cobblestoning present in the posterior oropharynx.  Eyes:     General: Lids are normal. Allergic shiner present.        Right eye: No discharge.        Left eye: No discharge.     Conjunctiva/sclera: Conjunctivae normal.     Right eye: Right conjunctiva is not injected. No chemosis.    Left eye: Left conjunctiva is not injected. No chemosis.    Pupils: Pupils are equal, round, and reactive to light.  Cardiovascular:     Rate and Rhythm: Normal rate and regular rhythm.     Heart sounds: Normal heart sounds.  Pulmonary:     Effort: Pulmonary effort is  normal. No tachypnea, accessory muscle usage or respiratory distress.     Breath sounds: Normal breath sounds. No wheezing, rhonchi or rales.     Comments: Moving air well in all lung fields.  No increased work of breathing. Chest:     Chest wall: No tenderness.  Lymphadenopathy:     Cervical: No cervical adenopathy.  Skin:    General: Skin is warm.     Capillary Refill: Capillary refill takes less than 2 seconds.     Coloration: Skin is not pale.     Findings: No abrasion, erythema, petechiae or rash. Rash is not papular, urticarial or vesicular.     Comments: No eczematous or urticarial lesions noted.  Neurological:     Mental Status: She is alert.  Psychiatric:        Behavior: Behavior is cooperative.      Diagnostic studies:    Spirometry: results normal (FEV1: 3.01/108%, FVC: 3.39/101%, FEV1/FVC: 89%).    Spirometry consistent with normal pattern.   Allergy Studies: none       Malachi Bonds, MD  [facility]

## 2023-05-01 NOTE — Patient Instructions (Addendum)
1. Moderate persistent asthma, uncomplicated - Lung testing looks great today.  - Consider starting Tezspire (handout provided).  - Tammy said that it is on your formulary.  - Daily controller medication(s): Trelegy one puff once daily + Singulair 10mg  daily - Prior to physical activity: albuterol 2 puffs 10-15 minutes before physical activity. - Rescue medications: albuterol 4 puffs every 4-6 hours as needed - Asthma control goals:  * Full participation in all desired activities (may need albuterol before activity) * Albuterol use two time or less a week on average (not counting use with activity) * Cough interfering with sleep two time or less a month * Oral steroids no more than once a year * No hospitalizations  2. Allergic rhinitis - Continue with Allegra one tablet once daily as needed. - Continue with Flonase one spray per nostril daily as needed.   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  - The azithromycin is used for controlling inflammation in severe asthmatics as well, so we are killing two birds with one stone.  - We could consider doing immunoglobulin replacement therapy if the addition of the Tezspire does not help.    4. Return in about 6 weeks (around 06/12/2023).    Please inform us of any Emergency Department visits, hospitalizations, or changes in symptoms. Call us before going to the ED for breathing or allergy symptoms since we might be able to fit you in for a sick visit. Feel free to contact us anytime with any questions, problems, or concerns.  It was a pleasure to see you again today!   Websites that have reliable patient information: 1. American Academy of Asthma, Allergy, and Immunology: www.aaaai.org 2. Food Allergy Research and Education (FARE): foodallergy.org 3. Mothers of Asthmatics: http://www.asthmacommunitynetwork.org 4. American College of Allergy, Asthma, and  Immunology: www.acaai.org  "Like" [place] on Facebook and Instagram for our latest updates!       Make sure you are registered to vote! If you have moved or changed any of your contact information, you will need to get this updated before voting!  In some cases, you MAY be able to register to vote online: [email or web address]

## 2023-05-02 ENCOUNTER — Telehealth: Payer: Self-pay | Admitting: *Deleted

## 2023-05-02 NOTE — Telephone Encounter (Signed)
Sounds good.  That is fine with me.

## 2023-05-02 NOTE — Telephone Encounter (Signed)
Patient was seen in office yesterday and signed consent for Tezspire. She would like to go forward with the approval process and start. She did state that she would like to do the injections at home and have her husband come with her for the new start to administer.

## 2023-05-03 ENCOUNTER — Encounter: Payer: Self-pay | Admitting: Allergy & Immunology

## 2023-05-05 NOTE — Addendum Note (Signed)
Addended by: Devoria Glassing on: 05/05/2023 04:43 PM   Modules accepted: Orders

## 2023-05-05 NOTE — Telephone Encounter (Signed)
L/m for patient to contact me to advise approval, copay card and submit to [facility] ([facility] for [employer]

## 2023-05-07 ENCOUNTER — Telehealth (HOSPITAL_COMMUNITY): Payer: Self-pay

## 2023-05-12 MED ORDER — TEZSPIRE 210 MG/1.91ML ~~LOC~~ SOAJ: 210.0000 mg | SUBCUTANEOUS | 11 refills | Status: AC

## 2023-05-12 NOTE — Addendum Note (Signed)
Addended by: Devoria Glassing on: 05/12/2023 09:55 AM   Modules accepted: Orders

## 2023-05-12 NOTE — Telephone Encounter (Signed)
Spoke to patient and advised submit to [facility]. Instructed on delivery, storage, doing and initial injection in clinic with appt

## 2023-05-14 NOTE — Telephone Encounter (Signed)
Great - thanks Tam Tam!

## 2023-05-19 ENCOUNTER — Telehealth: Payer: Self-pay

## 2023-05-19 NOTE — Telephone Encounter (Signed)
-----   Message from Leola B sent at 04/24/2023  8:06 AM EDT ----- Regarding: Create Order/Sign Good morning:) Can you help Jayden Kratochvil Stanley to get Dr. Marchelle Gearing to create an order for her? ----- Message ----- From: Minette Brine Sent: 04/24/2023   7:40 AM EDT To: Kelly Splinter  Can you please help and make sure that Dr. Marchelle Gearing does  this so we can schedule GXT. I really appreciate it. ----- Message ----- From: Minette Brine Sent: 04/21/2023   2:30 PM EDT To: Kalman Shan, MD  Please create an Order for an ATTESTATION ( [id number]) for ordered Myoview/GXT/Stress Echocardiogram.  This must be signed by ordering Provider.  We do not accept verbal cosign.  This test will not be scheduled until ATT is obtained.

## 2023-05-19 NOTE — Telephone Encounter (Signed)
I am just today receiving some training on Epic messaging, so I did not see this information prior to today.  I am working with Lajoyce Lauber who was able to confirm that the order is complete and the test is scheduled.

## 2023-05-26 ENCOUNTER — Ambulatory Visit (HOSPITAL_COMMUNITY): Payer: PRIVATE HEALTH INSURANCE | Attending: Internal Medicine

## 2023-05-26 DIAGNOSIS — R0600 Dyspnea, unspecified: Secondary | ICD-10-CM

## 2023-05-26 DIAGNOSIS — Z8619 Personal history of other infectious and parasitic diseases: Secondary | ICD-10-CM | POA: Insufficient documentation

## 2023-05-26 LAB — ECHOCARDIOGRAM COMPLETE BUBBLE STUDY
Area-P 1/2: 4.68 cm2
Calc EF: 62.9 %
S' Lateral: 2.6 cm
Single Plane A2C EF: 62.6 %
Single Plane A4C EF: 62 %

## 2023-06-03 ENCOUNTER — Ambulatory Visit: Payer: PRIVATE HEALTH INSURANCE

## 2023-06-03 DIAGNOSIS — J4550 Severe persistent asthma, uncomplicated: Secondary | ICD-10-CM

## 2023-06-03 MED ORDER — TEZEPELUMAB-EKKO 210 MG/1.91ML ~~LOC~~ SOSY
210.0000 mg | PREFILLED_SYRINGE | Freq: Once | SUBCUTANEOUS | Status: AC
Start: 2023-06-03 — End: 2023-06-03
  Administered 2023-06-03: 210 mg via SUBCUTANEOUS

## 2023-06-03 NOTE — Progress Notes (Signed)
Immunotherapy   Patient Details  Name: ALYEA BARGE MRN: [id number] Date of Birth: 08/20/1985  06/03/2023  Veverly Fells here to start Tezspire for Asthma. Patient was shown how to self administer and demonstrated correct administration. Patient waited 15 minutes per protocol with no issues. Patient will continue at home administration.   Frequency:every 28 days Epi-Pen:Epi-Pen Available  Consent signed and patient instructions given.   Dub Mikes 06/03/2023, 10:07 AM

## 2023-06-09 ENCOUNTER — Ambulatory Visit: Payer: PRIVATE HEALTH INSURANCE | Admitting: Family Medicine

## 2023-06-10 NOTE — Patient Instructions (Incomplete)
1. Moderate persistent asthma, uncomplicated- controlled - Daily controller medication(s): Trelegy one puff once daily + Singulair 10mg  daily -Continue Tezspire injections every 4 weeks - Prior to physical activity: albuterol 2 puffs 10-15 minutes before physical activity. - Rescue medications: albuterol 4 puffs every 4-6 hours as needed - Asthma control goals:  * Full participation in all desired activities (may need albuterol before activity) * Albuterol use two time or less a week on average (not counting use with activity) * Cough interfering with sleep two time or less a month * Oral steroids no more than once a year * No hospitalizations  2. Allergic rhinitis-moderately controlled - Continue with Allegra one tablet once daily as needed. - Continue with Flonase one spray per nostril daily as needed.   3. Specific antibody deficiency - Continue azithromycin 250mg  three times weekly.  - We could consider doing immunoglobulin replacement therapy if the addition of the Tezspire does not help.    4.Schedule a follow up appointment in 2-3 months with Dellis Anes or sooner if needed

## 2023-06-11 ENCOUNTER — Other Ambulatory Visit: Payer: Self-pay

## 2023-06-11 ENCOUNTER — Encounter: Payer: Self-pay | Admitting: Family

## 2023-06-11 ENCOUNTER — Ambulatory Visit: Payer: PRIVATE HEALTH INSURANCE | Admitting: Family

## 2023-06-11 VITALS — BP 112/60 | HR 88 | Temp 98.0°F | Resp 16 | Wt 134.9 lb

## 2023-06-11 DIAGNOSIS — J4540 Moderate persistent asthma, uncomplicated: Secondary | ICD-10-CM

## 2023-06-11 DIAGNOSIS — D806 Antibody deficiency with near-normal immunoglobulins or with hyperimmunoglobulinemia: Secondary | ICD-10-CM

## 2023-06-11 DIAGNOSIS — J3089 Other allergic rhinitis: Secondary | ICD-10-CM

## 2023-06-11 NOTE — Progress Notes (Signed)
[address] [place] [address] Dept: [phone]  FOLLOW UP NOTE  Patient ID: Mariah [name], female    DOB: 1986-06-01  Age: 37 y.o. MRN: [id number] Date of Office Visit: 06/11/2023  Assessment  Chief Complaint: Follow-up  HPI Mariah [name] is a 37 year old female who presents today for follow-up of moderate persistent asthma, seasonal and perennial allergic rhinitis, and specific antibody deficiency.  She was last seen on May 01, 2023 by Dr. Dellis Anes.  She denies any new diagnosis or surgery since her last office visit, but reports that she had an echocardiogram complete bubble study, but reports that she has not seen the results yet.  Moderate persistent asthma: She continues to take Trelegy 100 mcg 1 puff a day, Singulair 10 mg daily, albuterol as needed, and Tezspire injections every 4 weeks.  She reports that she had her first Tezspire injection on December 10.  She denies any problems or reactions with the Tezspire injection.  She denies cough, wheeze, tightness in chest, shortness of breath and nocturnal awakenings due to breathing problems.  She reports that her tightness in her chest is way better than what it has been in the past.  Since her last office visit she has not required any systemic steroids or made any trips to the emergency room or urgent care due to breathing problems.  She has not had to use her albuterol since we last saw her.  Allergic rhinitis: She reports that she will have rhinorrhea, nasal congestion, and postnasal drip at times.  The leaves really get to her.  She uses Flonase nasal spray and saline spray.  A saline spray really works wonders for her and helps with her nasal congestion.  Her rhinorrhea is clear in color.  She has not had any sinus infections since we last saw her.  Specific antibody deficiency: She is currently taking azithromycin 250 mg 3 times a week at this time she is not interested in doing immunoglobulin replacement therapy  because it sounds like a lot of scheduling. She has not had any infections since her last office visit.   Drug Allergies:  Allergies  Allergen Reactions   Omnicef [Cefdinir] Nausea And Vomiting   Codeine Nausea Only and Other (See Comments)    Passes out   Vicodin [Hydrocodone-Acetaminophen] Nausea Only and Other (See Comments)    Passes out    Review of Systems: Negative except as per HPI   Physical Exam: BP 112/60   Pulse 88   Temp 98 F (36.7 C) (Temporal)   Resp 16   Wt 134 lb 14.4 oz (61.2 kg)   SpO2 97%   BMI 25.47 kg/m    Physical Exam Constitutional:      Appearance: Normal appearance.  HENT:     Head: Normocephalic and atraumatic.     Comments: Pharynx: Cobblestoning present, eyes normal, ears normal, nose normal    Right Ear: Tympanic membrane, ear canal and external ear normal.     Left Ear: Tympanic membrane, ear canal and external ear normal.     Nose: Nose normal.     Mouth/Throat:     Mouth: Mucous membranes are moist.     Pharynx: Oropharynx is clear.  Eyes:     Conjunctiva/sclera: Conjunctivae normal.  Cardiovascular:     Rate and Rhythm: Regular rhythm.     Heart sounds: Normal heart sounds.  Pulmonary:     Effort: Pulmonary effort is normal.     Breath sounds: Normal breath  sounds.     Comments: Lungs clear to auscultation Musculoskeletal:     Cervical back: Neck supple.  Skin:    General: Skin is warm.  Neurological:     Mental Status: She is alert and oriented to person, place, and time.  Psychiatric:        Mood and Affect: Mood normal.        Behavior: Behavior normal.        Thought Content: Thought content normal.        Judgment: Judgment normal.     Diagnostics: FVC 3.2 6 L (93%), FEV1 3.03 L (104%), FEV1/FVC 0.93.  Spirometry indicates normal spirometry.  Assessment and Plan: 1. 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 Moderate persistent asthma, uncomplicated   3.  Perennial allergic rhinitis     No orders of the defined types were placed in this encounter.   Patient Instructions  1. Moderate persistent asthma, uncomplicated- controlled - Daily controller medication(s): Trelegy one puff once daily + Singulair 10mg  daily -Continue Tezspire injections every 4 weeks - Prior to physical activity: albuterol 2 puffs 10-15 minutes before physical activity. - Rescue medications: albuterol 4 puffs every 4-6 hours as needed - Asthma control goals:  * Full participation in all desired activities (may need albuterol before activity) * Albuterol use two time or less a week on average (not counting use with activity) * Cough interfering with sleep two time or less a month * Oral steroids no more than once a year * No hospitalizations  2. Allergic rhinitis-moderately controlled - Continue with Allegra one tablet once daily as needed. - Continue with Flonase one spray per nostril daily as needed.   3. Specific antibody deficiency - Continue azithromycin 250mg  three times weekly.  - We could consider doing immunoglobulin replacement therapy if the addition of the Tezspire does not help.    4.Schedule a follow up appointment in 2-3 months with Dellis Anes or sooner if needed  Return in about 3 months (around 09/09/2023), or if symptoms worsen or fail to improve.    Thank you for the opportunity to care for this patient.  Please do not hesitate to contact me with questions.  Nehemiah Settle, FNP [facility]

## 2023-07-07 ENCOUNTER — Encounter: Payer: Self-pay | Admitting: Internal Medicine

## 2023-07-07 ENCOUNTER — Ambulatory Visit: Payer: PRIVATE HEALTH INSURANCE | Admitting: Internal Medicine

## 2023-07-07 VITALS — BP 122/78 | HR 73 | Ht 62.0 in | Wt 134.8 lb

## 2023-07-07 DIAGNOSIS — Q248 Other specified congenital malformations of heart: Secondary | ICD-10-CM

## 2023-07-07 DIAGNOSIS — Z8709 Personal history of other diseases of the respiratory system: Secondary | ICD-10-CM

## 2023-07-07 NOTE — Progress Notes (Signed)
 OV 02/21/2023  Subjective:  Patient ID: Mariah [name], female , DOB: 1985-11-07 , age 38 y.o. , MRN: [id number] , ADDRESS: [address] [place] [address] PCP Patient, No Pcp Per Patient Care Team: Patient, No Pcp Per as PCP - General (General Practice)  This Provider for this visit: Treatment Team:  Attending Provider: Mannam, Praveen, MD    02/21/2023 -   Chief Complaint  Patient presents with   Consult    Consult on asthma     HPI Mariah [name] 37 y.o. -presents for new consultation.  She was seen in the ED on 01/27/2023 for a discharge diagnosis of mild asthma without complication.  CT angiogram chest showed resolved pulmonary infiltrates from a year earlier and no pulmonary embolism.  She has seen Dr. Iva on 10/31/2018 for the allergy clinic and has a diagnosis of moderate persistent asthma on Trelegy and Singulair  along with allergic rhinitis.  She is also on azithromycin  3 times weekly for recurrent infections but she takes it only as needed.  She takes this as needed.  Last visit with him was on 10/31/2022 with a plan to return in 6 months.   She is here today because she is really concerned about her level of symptoms.  She tells me that in January 2023 she picked up RSV infection from a baby and then ended up in the hospital because she could not get rid of it.  CT scan of the chest showed double pneumonia.  Personal visualization shows some bilateral lower lobe infiltrates.  This is absent on the current CT chest in August 2024.  But since then her quality of life has not been the same.  Before that she was only having exercise-induced bronchospasm using albuterol  very occasionally not on Trelegy not on Singulair  but since then has seen Dr. Iva is on Trelegy and Singulair  and azithromycin .  Things are better but not back at baseline.  She says at baseline she is having shortness of breath and fatigue.  But then when she gets a respiratory infection it  hits a harder and she gets asthma symptoms again she is ended up in the ER.  She is using albuterol  a lot more.  In the remote past between 2012 and 2019 she room was having hives and being on Xolair  with Dr. Maurilio or Dr. Frutoso.  She has not had allergy testing but her immunoglobulins were normal.  IgE is normal.  She remembers allergy testing over 10 years ago with Dr. Frutoso skin allergy testing.  She is open for investigations.    IMPRESSION: CT Angio 01/27/23 1. No pulmonary embolus or acute intrathoracic abnormality. 2. Previous airspace disease has resolved.     Electronically Signed   By: Mariah [name] M.D.   On: 01/27/2023 20:48    OV 04/14/2023  Subjective:  Patient ID: Mariah [name], female , DOB: 1985-07-14 , age 61 y.o. , MRN: [id number] , ADDRESS: [address] [place] [address] PCP Patient, No Pcp Per Patient Care Team: Patient, No Pcp Per as PCP - General (General Practice)  This Provider for this visit: Treatment Team:  Attending Provider: Geronimo Amel, MD    04/14/2023 -   Chief Complaint  Patient presents with   Follow-up     HPI Mariah [name] 38 y.o. -`` returns for follow-up.  When I saw her last time she was advised by Dr. Iva to do her azithromycin  on a scheduled basis.  She  says while this was helping her it was extreme aching it rough on my stomach.  Therefore in the last 3 when she has not taken it.  She says she will only take it in response to illness but this last 3 months he has not been ill.  Overall she is feels stable.  She continues on Zyrtec , Allegra, Flonase , Singulair  and Trelegy.  She just states that ever since the RSV infection despite CT scan becoming normal she does not feel normal enough or well enough.  She has multitude of symptoms all documented below in the symptom assessment scale and we can see this a lot of fatigue and some shortness of breath and just feeling nonspecifically bad.  She did have  echocardiogram and it is normal but they could not rule out a small PFO.  Blood allergy testing is positive for dog and cat dander.  She says she had a cat like 10 years ago.  The current house where she lives previous owner had a cat but the house has been completely redone.  She does have a dog that is across between [employer] and sheds a lot the dog is 63 years of age.  I did encourage her to meet with Dr. Iva to talk about allergy shots.  She says she will do that pulmonary function test is normal although the DLCO is on the higher side which can be a feature and asthma but there is no bronchodilator response.  She is willing to get a pulmonary stress test done.  We did talk about the fact that some of the symptoms could potentially reflect postviral long-haul and she is understanding.   OV 07/07/2023  Subjective:  Patient ID: Mariah [name], female , DOB: May 22, 1986 , age 54 y.o. , MRN: [id number] , ADDRESS: [address] [place] [address] PCP Patient, No Pcp Per Patient Care Team: Patient, No Pcp Per as PCP - General (General Practice)  This Provider for this visit: Treatment Team:  Attending Provider: Geronimo Amel, MD    07/07/2023 -   Chief Complaint  Patient presents with   Follow-up    Pt states she has a chest cold today, had a echo done 12/2 and tezpire start       HPI Mariah [name] 37 y.o. -follow-up from respiratory symptoms and significant fatigue following respiratory infection with RSV January 2023.  And pulmonary infiltrates that ultimately resolved as of August 2024.  This in the background of asthma followed by Dr. Iva.  Since her last visit she has been started on TEZPIRE biologic for asthma.  She continues 3 days a week of low-dose azithromycin , Zyrtec , Flonase  Trelegy and Singulair .  She says the biologic is made a significant difference.  For the last 1 week after her daughter got a cold she is picked up her cough with  yellow-white sputum.  She says she is recovering slowly with the help of Tylenol .  She is got fatigue.  But she is able to work.  She states without the biologic she would have been on the much thicker and not been able to work.  She does not want any treatment for this.  I supported supportive care for this.  She had are aligned on that.  She tells me that a lot of the symptoms she had when she saw me last especially fatigue improved significantly after the biologic [see below].  Therefore she is not opted to do the pulmonary stress test.  Only thing when I did an echo on her we found that she might have a PFO.  Therefore did a bubble study.  She does have evidence of a right-to-left shunt but ironically the echo tech is saying the bubbles are delayed and suggestive of intrapulmonary shunt.  There is no evidence of any AVMs or any CT lung abnormalities to suggest intrapulmonary shunt.  We did discuss the prevalence of PFO's being at least 10% in the general population and about seeing the structural cardiologist to put the issue at rest.  She is amenable to this idea.  Definite referral has been made.    [place] Symptom Assessment Numerical Scale 0 is no problem -> 10 worst problem 04/14/2023   07/07/2023 Post tezpiore, pre acute bronchitis  No Pain -> Worst pain 0 0  No Tiredness -> Worset tiredness 5 3  No Nausea -> Worst nausea 0 0  No Depression -> Worst depression 0 0  No Anxiety -> Worst Anxiety 1 0  No Drowsiness -> Worst Drowsiness 0 1  Best appetite-> Worst Appetitle 3 3  Best Feeling of well being -> Worst feeling 3 1  No dyspnea-> Worst dyspnea 5 1  Other problem (none -> severe) x x  Completed by  patioent patient        ECHO 05/26/23 BUBBLE STUDY MPRESSIONS     1. Left ventricular ejection fraction, by estimation, is 60 to 65%. The  left ventricle has normal function. The left ventricle has no regional  wall motion abnormalities. Left ventricular diastolic parameters were   normal. The average left ventricular  global longitudinal strain is -21.7 %. The global longitudinal strain is  normal.   2. Right ventricular systolic function is normal. The right ventricular  size is normal. There is normal pulmonary artery systolic pressure.   3. The mitral valve is normal in structure. Trivial mitral valve  regurgitation. No evidence of mitral stenosis.   4. The aortic valve is normal in structure. Aortic valve regurgitation is  trivial. Aortic valve sclerosis/calcification is present, without any  evidence of aortic stenosis.   5. The inferior vena cava is normal in size with greater than 50%  respiratory variability, suggesting right atrial pressure of 3 mmHg.   6. Agitated saline contrast bubble study was positive with shunting  observed after >6 cardiac cycles suggestive of intrapulmonary shunting.      LAB RESULTS last 96 hours No results found.  LAB RESULTS last 90 days Recent Results (from the past 2160 hours)  Pulmonary function test     Status: None   Collection Time: 04/14/23  8:59 AM  Result Value Ref Range   FVC-Pre 3.44 L   FVC-%Pred-Pre 98 %   FVC-Post 3.45 L   FVC-%Pred-Post 98 %   FVC-%Change-Post 0 %   FEV1-Pre 3.19 L   FEV1-%Pred-Pre 109 %   FEV1-Post 3.20 L   FEV1-%Pred-Post 110 %   FEV1-%Change-Post 0 %   FEV6-Pre 3.44 L   FEV6-%Pred-Pre 99 %   FEV6-Post 3.45 L   FEV6-%Pred-Post 100 %   FEV6-%Change-Post 0 %   Pre FEV1/FVC ratio 93 %   FEV1FVC-%Pred-Pre 111 %   Post FEV1/FVC ratio 93 %   FEV1FVC-%Change-Post 0 %   Pre FEV6/FVC Ratio 100 %   FEV6FVC-%Pred-Pre 101 %   Post FEV6/FVC ratio 100 %   FEV6FVC-%Pred-Post 101 %   FEF 25-75 Pre 4.37 L/sec   FEF2575-%Pred-Pre 138 %   FEF 25-75 Post 4.83 L/sec   FEF2575-%Pred-Post  153 %   FEF2575-%Change-Post 10 %   RV 1.93 L   RV % pred 137 %   TLC 5.04 L   TLC % pred 106 %   DLCO unc 27.23 ml/min/mmHg   DLCO unc % pred 131 %   DLCO cor 27.58 ml/min/mmHg   DLCO cor % pred  133 %   DL/VA 4.39 ml/min/mmHg/L   DL/VA % pred 877 %  ECHOCARDIOGRAM COMPLETE BUBBLE STUDY     Status: None   Collection Time: 05/26/23 11:54 AM  Result Value Ref Range   Area-P 1/2 4.68 cm2   S' Lateral 2.60 cm   Single Plane A2C EF 62.6 %   Single Plane A4C EF 62.0 %   Calc EF 62.9 %   Est EF 60 - 65%          has a past medical history of Allergy, Anxiety state, unspecified, Asthma, Panic disorder without agoraphobia, and Urticaria.   reports that she has never smoked. She has never been exposed to tobacco smoke. She has never used smokeless tobacco.  Past Surgical History:  Procedure Laterality Date   TONSILLECTOMY     01/26/07- Dr. Carlie   WISDOM TOOTH EXTRACTION      Allergies  Allergen Reactions   Omnicef  [Cefdinir ] Nausea And Vomiting   Codeine Nausea Only and Other (See Comments)    Passes out   Vicodin [Hydrocodone -Acetaminophen ] Nausea Only and Other (See Comments)    Passes out    Immunization History  Administered Date(s) Administered   Hpv-Unspecified 01/15/2006, 03/06/2006, 09/03/2006   Influenza Inj Mdck Quad Pf 04/20/2020   Influenza Whole 05/22/2004, 03/29/2009   Influenza-Unspecified 04/23/2015, 04/13/2019   Meningococcal polysaccharide vaccine (MPSV4) 01/30/2005   Td 02/24/2003   Tdap 09/05/2015, 05/25/2020    Family History  Problem Relation Age of Onset   Hypertension Father    Drug abuse Father    Arthritis Father    Crohn's disease Father    Depression Mother    Diabetes Other    Cancer Other    Stroke Other    Congestive Heart Failure Paternal Grandmother      Current Outpatient Medications:    acetaminophen  (TYLENOL ) 500 MG tablet, Take 500 mg by mouth every 6 (six) hours as needed for mild pain or headache., Disp: , Rfl:    albuterol  (VENTOLIN  HFA) 108 (90 Base) MCG/ACT inhaler, Inhale 2 puffs into the lungs every 4 (four) hours as needed for wheezing or shortness of breath., Disp: 18 g, Rfl: 1   azithromycin  (ZITHROMAX ) 250  MG tablet, Take one tablet every Monday, Wednesday, Friday, Disp: 12 tablet, Rfl: 5   cetirizine  (ZYRTEC ) 10 MG tablet, Take 10 mg by mouth daily as needed for allergies., Disp: , Rfl:    drospirenone -ethinyl estradiol  (YAZ) 3-0.02 MG tablet, Take 1 tablet by mouth daily., Disp: , Rfl:    fluticasone  (FLONASE ) 50 MCG/ACT nasal spray, Place 1 spray into both nostrils daily as needed for allergies or rhinitis., Disp: 16 g, Rfl: 5   Fluticasone -Umeclidin-Vilant (TRELEGY ELLIPTA ) 200-62.5-25 MCG/ACT AEPB, INHALE 1 PUFF ONCE DAILY TO PREVENT COUGH AND WHEEZE, RINSE MOUTH AFTER, Disp: 60 each, Rfl: 5   montelukast  (SINGULAIR ) 10 MG tablet, Take 1 tablet (10 mg total) by mouth at bedtime., Disp: 30 tablet, Rfl: 5   ondansetron  (ZOFRAN ) 4 MG tablet, Take 1 tablet (4 mg total) by mouth every 8 (eight) hours as needed for nausea or vomiting., Disp: 10 tablet, Rfl: 0   Tezepelumab -ekko (TEZSPIRE ) 210 MG/1.91ML  SOAJ, Inject 210 mg into the skin every 28 (twenty-eight) days., Disp: 1.91 mL, Rfl: 11   tretinoin (RETIN-A) 0.025 % cream, tretinoin 0.025 % topical cream  APPLY TO AFFECTED AREA ON FACE EVERY EVENING, Disp: , Rfl:       Objective:   Vitals:   07/07/23 1445  BP: 122/78  Pulse: 73  SpO2: 95%  Weight: 134 lb 12.8 oz (61.1 kg)  Height: 5' 2 (1.575 m)    Estimated body mass index is 24.66 kg/m as calculated from the following:   Height as of this encounter: 5' 2 (1.575 m).   Weight as of this encounter: 134 lb 12.8 oz (61.1 kg).  @WEIGHTCHANGE @  Filed Weights   07/07/23 1445  Weight: 134 lb 12.8 oz (61.1 kg)     Physical Exam   General: No distress. Looks well O2 at rest: no Cane present: no Sitting in wheel chair: no Frail: no Obese: no Neuro: Alert and Oriented x 3. GCS 15. Speech normal Psych: Pleasant Resp:  Barrel Chest - no.  Wheeze - no, Crackles - no, No overt respiratory distress CVS: Normal heart sounds. Murmurs - no Ext: Stigmata of Connective Tissue Disease -  no HEENT: Normal upper airway. PEERL +. No post nasal drip        Assessment:       ICD-10-CM   1. History of acute bronchitis  Z87.09 Ambulatory referral to Cardiology    2. History of asthma  Z87.09 Ambulatory referral to Cardiology    3. 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 History of acute bronchitis  Z87.09     2. History of asthma  Z87.09     3. Interatrial cardiac shunt  Q24.8       History of acute bronchitis  -Currently suffering from but having a resolving acute bronchitis after exposure to sick kid.  Plan  - Supportive care  History of asthma  -Glad treatment with biologic TEZPIRE, other medications with Dr. Iva is helping you significantly and has helped your fatigue and other symptoms  Plan - Please follow Dr. 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  Bubble study showing right-to-left shunt but it is suggesting lung cancer reason.  Ureh CT scan of the chest in August 2024 does not show any evidence of lung issues causing right-to-left shunt  Plan - Referred to the structural heart program at [facility] [nonurgent] for second opinion    Followup  - 6 months with Dr. Geronimo or nurse practitioner; 15-minute visit   FOLLOWUP Return in about 6 months (around 01/04/2024) for 15 min visit, Asthma, with Dr Mariah, Face to Face OR Video Visit.    SIGNATURE    Dr. Dorethia Mariah, M.D., F.C.C.P,  Pulmonary and Critical Care Medicine Staff Physician, [facility] Director - Interstitial Lung Disease  Program  Pulmonary Fibrosis [facility] at [facility] [place], [place], [address]  Pager: [phone], If no answer or between  15:00h - 7:00h: call [phone] Telephone: [phone]  3:24 PM 07/07/2023

## 2023-07-07 NOTE — Patient Instructions (Addendum)
 ICD-10-CM   1. History of acute bronchitis  Z87.09     2. History of asthma  Z87.09     3. Interatrial cardiac shunt  Q24.8       History of acute bronchitis  -Currently suffering from but having a resolving acute bronchitis after exposure to sick kid.  Plan  - Supportive care  History of asthma  -Glad treatment with biologic TEZPIRE, other medications with Dr. Iva is helping you significantly and has helped your fatigue and other symptoms  Plan - Please follow Dr. 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  Bubble study showing right-to-left shunt but it is suggesting lung cancer reason.  Ureh CT scan of the chest in August 2024 does not show any evidence of lung issues causing right-to-left shunt  Plan - Referred to the structural heart program at [facility] [nonurgent] for second opinion    Followup  - 6 months with Dr. Geronimo or nurse practitioner; 15-minute visit

## 2023-07-31 ENCOUNTER — Other Ambulatory Visit: Payer: Self-pay | Admitting: Allergy & Immunology

## 2023-07-31 NOTE — Telephone Encounter (Signed)
 Dr. Idolina Maker would you like for Mariah [name] to have Zofran  on hand? I gave it to her one time.

## 2023-07-31 NOTE — Telephone Encounter (Signed)
 Is this okay to refill?

## 2023-08-12 ENCOUNTER — Telehealth: Payer: Self-pay | Admitting: *Deleted

## 2023-08-12 NOTE — Telephone Encounter (Signed)
 Lmovm to verify card hx.

## 2023-08-14 ENCOUNTER — Ambulatory Visit: Payer: PRIVATE HEALTH INSURANCE | Admitting: Cardiology

## 2023-11-06 ENCOUNTER — Ambulatory Visit: Payer: PRIVATE HEALTH INSURANCE | Admitting: Cardiology

## 2023-11-06 ENCOUNTER — Encounter: Payer: Self-pay | Admitting: Cardiology

## 2023-11-06 ENCOUNTER — Ambulatory Visit: Payer: PRIVATE HEALTH INSURANCE | Attending: Cardiology | Admitting: Cardiology

## 2023-11-06 VITALS — BP 98/70 | HR 85 | Ht 62.0 in | Wt 130.2 lb

## 2023-11-06 DIAGNOSIS — R931 Abnormal findings on diagnostic imaging of heart and coronary circulation: Secondary | ICD-10-CM | POA: Insufficient documentation

## 2023-11-06 DIAGNOSIS — J4540 Moderate persistent asthma, uncomplicated: Secondary | ICD-10-CM | POA: Diagnosis not present

## 2023-11-06 NOTE — Assessment & Plan Note (Signed)
 Managed by combination of pulmonary medicine and allergist.  Overall doing better with biologic.  Requiring less frequent inhalers.  Overall seems to be recovering after prolonged course to recover from pneumonia.

## 2023-11-06 NOTE — Progress Notes (Signed)
 Cardiology Office Note:  .   Date:  11/06/2023  ID:  Mariah [name], DOB [birth year], MRN [id number] PCP: Patient, No Pcp Per  [facility] Providers Cardiologist:  None     Chief Complaint  Patient presents with   New Patient (Initial Visit)    Abnormal findings on echocardiogram suggesting PFO    Patient Profile: .     Mariah [name] is a very pleasant/otherwise healthy 38 y.o. female with a PMH notable for Exercise-Induced Bronchospasm who presents here for Cardiology Evaluation to discuss results of echocardiogram at the request of Maire Scot, MD.    Mariah [name] was recently seen by Dr. Bertrum Brodie in January 2025 noting baseline dyspnea and fatigue that is significantly activated by respiratory infections with exacerbated asthma.  Using albuterol  frequently.  She is on TEZPIRE biologic for her asthma..  He noted suggestion of a possible PFO on echo and then ordered a bubble study which did not show right-to-left shunting after 6 cardiac cycles suggesting intrapulmonary shunt.  She is referred to "structural heart disease clinic "to discuss PFO.  Somehow she ended up on my schedule.  Subjective  Discussed the use of AI scribe software for clinical note transcription with the patient, who gave verbal consent to proceed.  History of Present Illness Mariah [name] is a 38 year old female who presents for evaluation of a potential patent foramen ovale (PFO) after a bubble study. She was referred by Dr. Bertrum Brodie for evaluation of a potential PFO after a bubble study showed possible shunting.  She underwent an echocardiogram and a bubble study, which initially appeared normal but was later interpreted as showing possible shunting. No history of strokes or transient ischemic attacks (TIAs) is present. No migraines, which are sometimes associated with PFOs, and no unexplained strokes or TIAs. Occasional headaches occur but not migraines.  She has a history of moderate asthma,  which worsened after a bout of pneumonia. The pneumonia, initially caused by RSV, progressed to double pneumonia, leading to a year-long illness. Her asthma symptoms have been exacerbated since the pneumonia, and she experiences exercise-induced asthma. She is currently on Tezspire  injections for asthma management.  Her past medical history includes a significant episode of pneumonia, which led to a referral to a pulmonologist by her allergist. She was treated with azithromycin  250 mg three times a week to help restore her immune system.  She does not smoke or drink alcohol. She is not overweight and has no history of diabetes. She has a history of having a baby with no complications related to the suspected PFO.  Cardiovascular ROS: positive for - dyspnea on exertion, shortness of breath, and this is all notably improving with treatment of her reactive airway disease; fatigue is also improving. negative for - chest pain, irregular heartbeat, orthopnea, palpitations, paroxysmal nocturnal dyspnea, rapid heart rate, or syncope or Nishan, TIA or amaurosis fugax.  Claudication    Objective   Social History  reports that she has never smoked. She has never been exposed to tobacco smoke. She has never used smokeless tobacco. She reports current alcohol use. She reports that she does not use drugs.   Family History family history includes Arthritis in her father; Cancer in an other family member; Congestive Heart Failure in her paternal grandmother; Crohn's disease in her father; Depression in her mother; Diabetes in an other family member; Drug abuse in her father; Hypertension in her father; Stroke in an other family member.   Medications -  Azithromycin  250 mg three times a week - Tezspire  injection for asthma - Albuterol  inhaler 2 puffs every 4 hours as needed (requiring less) - Flonase  nasal spray as needed allergies - Trelegy Ellipta  inhaler 1 puff daily  Studies Reviewed: Aaron Aas   EKG  Interpretation Date/Time:  Thursday Nov 06 2023 08:21:22 EDT Ventricular Rate:  85 PR Interval:  142 QRS Duration:  78 QT Interval:  356 QTC Calculation: 423 R Axis:   93  Text Interpretation: Normal sinus rhythm Rightward axis When compared with ECG of 27-Jan-2023 18:45, PREVIOUS ECG IS PRESENT Confirmed by Randene Bustard ([id number]) on 11/06/2023 8:43:23 AM    ECHO with bubble 05/26/2023: EF 60 to 65%.  No RWMA.  Normal RV normal PAP.  AV sclerosis with no stenosis.  Normal RAP.  Bubble study suggests intrapulmonary shunting.  No atrial level shunt detected-bubble was positive after 6 cycles  => echo was personally reviewed by me and by Dr. Arlester Ladd.  We did not see any suggestion of bubbles crossing over within 2-3 beats.  Minimal if any bubbles noted even after 6 beats.  With this would be considered be a negative bubble study and would not necessarily even suggest an intrapulmonary shunt.  Risk Assessment/Calculations:        Physical Exam:   VS:  BP 98/70   Pulse 85   Ht 5\' 2"  (1.575 m)   Wt 130 lb 3.2 oz (59.1 kg)   LMP 07/09/2023 (Approximate)   SpO2 99%   BMI 23.81 kg/m    Wt Readings from Last 3 Encounters:  11/06/23 130 lb 3.2 oz (59.1 kg)  07/07/23 134 lb 12.8 oz (61.1 kg)  06/11/23 134 lb 14.4 oz (61.2 kg)    GEN: Healthy appearing.  Well nourished, well groomed in no acute distress;  NECK: No JVD; No carotid bruits CARDIAC: Normal S1, S2; RRR, no murmurs, rubs, gallops RESPIRATORY:  Clear to auscultation without rales, wheezing or rhonchi ; nonlabored, good air movement. EXTREMITIES:  No edema; No deformity      ASSESSMENT AND PLAN: .    Problem List Items Addressed This Visit     Abnormal echocardiogram findings without diagnosis   She was referred because of Echocardiogram with bubble study suggested possible intrapulmonary shunting, but further review indicates no significant PFO or ASD.  I discussed the pathophysiology involved with PFO versus ASD and how the  test would be positive.  Also discussed pulmonary AVMs. I had the 2 structural heart interventionalists review the bubble study after I reviewed it and read all 3 agree that we did not see any bubbles coming across to suggest PFO and if anything minuscule amount of data suggests intrapulmonary shunt.  Based on these results, would not recommend any further evaluation with either TEE/bubble or right heart cath. Low risk of PFO-related complications. - No intervention required. - Reassurance provided regarding low risk of complications. - Follow up if new symptoms such as migraines or unexplained neurological events occur.      Moderate persistent asthma, uncomplicated - Primary (Chronic)   Managed by combination of pulmonary medicine and allergist.  Overall doing better with biologic.  Requiring less frequent inhalers.  Overall seems to be recovering after prolonged course to recover from pneumonia.      Relevant Orders   EKG 12-Lead (Completed)         Follow-Up: Return if symptoms worsen or fail to improve, for Followup when necessary, [employer].  Recording duration: 30 minutes I spent  66 minutes in the care of Mariah [name] today including reviewing studies (20 min-personally reviewed echocardiogram and discussed with Dr. Arnoldo Lapping and Dr. Arun Thukkani), face to face time discussing treatment options (30), reviewing records from Dr. Bertrum Brodie (8 minutes), 8 minutes dictating, and documenting in the encounter.     Signed, Arleen Lacer, MD, MS Randene Bustard, M.D., M.S. Interventional Chartered certified accountant  Pager # [phone]

## 2023-11-06 NOTE — Assessment & Plan Note (Signed)
 She was referred because of Echocardiogram with bubble study suggested possible intrapulmonary shunting, but further review indicates no significant PFO or ASD.  I discussed the pathophysiology involved with PFO versus ASD and how the test would be positive.  Also discussed pulmonary AVMs. I had the 2 structural heart interventionalists review the bubble study after I reviewed it and read all 3 agree that we did not see any bubbles coming across to suggest PFO and if anything minuscule amount of data suggests intrapulmonary shunt.  Based on these results, would not recommend any further evaluation with either TEE/bubble or right heart cath. Low risk of PFO-related complications. - No intervention required. - Reassurance provided regarding low risk of complications. - Follow up if new symptoms such as migraines or unexplained neurological events occur.

## 2023-11-06 NOTE — Patient Instructions (Signed)
 Medication Instructions:  No changes at this time.   *If you need a refill on your cardiac medications before your next appointment, please call your pharmacy*  Lab Work: None  If you have labs (blood work) drawn today and your tests are completely normal, you will receive your results only by: MyChart Message (if you have MyChart) OR A paper copy in the mail If you have any lab test that is abnormal or we need to change your treatment, we will call you to review the results.  Testing/Procedures: None  Follow-Up: At [facility], you and your health needs are our priority.  As part of our continuing mission to provide you with exceptional heart care, our providers are all part of one team.  This team includes your primary Cardiologist (physician) and Advanced Practice Providers or APPs (Physician Assistants and Nurse Practitioners) who all work together to provide you with the care you need, when you need it.  Your next appointment:   Follow up as needed.

## 2023-11-20 ENCOUNTER — Other Ambulatory Visit: Payer: Self-pay | Admitting: Allergy & Immunology

## 2023-12-16 ENCOUNTER — Other Ambulatory Visit: Payer: Self-pay | Admitting: Allergy & Immunology

## 2023-12-23 ENCOUNTER — Telehealth (HOSPITAL_COMMUNITY): Payer: Self-pay

## 2023-12-23 NOTE — Telephone Encounter (Signed)
 front office left pt a voice message to schedule CPX test for Dr. Geronimo (ordered by Dr. Geronimo) - - front office personnel instructed pt to call front officer back to get scheduled.

## 2023-12-29 ENCOUNTER — Telehealth: Payer: Self-pay | Admitting: Family

## 2023-12-29 ENCOUNTER — Encounter: Payer: Self-pay | Admitting: Allergy & Immunology

## 2023-12-29 NOTE — Telephone Encounter (Signed)
 Per [facility] patient has new Ins she will need MD appt due for 6 months from Dec and bring new coverage in

## 2023-12-29 NOTE — Telephone Encounter (Signed)
 Mariah [name] from [facility] pharmacy called and stated that we need to do a pre auth for Mariah [name]'s Tezpire as [employer] is not approving it.  Best number to contact [phone].

## 2024-01-02 ENCOUNTER — Ambulatory Visit (INDEPENDENT_AMBULATORY_CARE_PROVIDER_SITE_OTHER): Payer: Self-pay | Admitting: Family Medicine

## 2024-01-02 ENCOUNTER — Other Ambulatory Visit: Payer: Self-pay

## 2024-01-02 ENCOUNTER — Encounter: Payer: Self-pay | Admitting: Family Medicine

## 2024-01-02 ENCOUNTER — Telehealth: Payer: Self-pay | Admitting: *Deleted

## 2024-01-02 VITALS — BP 108/74 | HR 84 | Temp 98.0°F | Ht 62.0 in | Wt 127.5 lb

## 2024-01-02 DIAGNOSIS — J4550 Severe persistent asthma, uncomplicated: Secondary | ICD-10-CM

## 2024-01-02 DIAGNOSIS — J3089 Other allergic rhinitis: Secondary | ICD-10-CM

## 2024-01-02 DIAGNOSIS — D806 Antibody deficiency with near-normal immunoglobulins or with hyperimmunoglobulinemia: Secondary | ICD-10-CM | POA: Insufficient documentation

## 2024-01-02 MED ORDER — TRELEGY ELLIPTA 200-62.5-25 MCG/ACT IN AEPB: INHALATION_SPRAY | RESPIRATORY_TRACT | 5 refills | Status: DC

## 2024-01-02 MED ORDER — MONTELUKAST SODIUM 10 MG PO TABS: 10.0000 mg | ORAL_TABLET | Freq: Every day | ORAL | 5 refills | Status: AC

## 2024-01-02 MED ORDER — ONDANSETRON HCL 4 MG PO TABS
4.0000 mg | ORAL_TABLET | Freq: Three times a day (TID) | ORAL | 0 refills | Status: DC | PRN
Start: 2024-01-02 — End: 2024-04-29

## 2024-01-02 NOTE — Telephone Encounter (Signed)
 Mariah [name]- received refill request for ondansetron  4mg . Okay to refill? Last refilled by Dr. Iva 07/2023.

## 2024-01-02 NOTE — Telephone Encounter (Signed)
 Yes please. Thank you

## 2024-01-02 NOTE — Progress Notes (Signed)
 [address] [place] [address] Dept: [phone]  FOLLOW UP NOTE  Patient ID: Mariah [name], female    DOB: Jun 30, 1985  Age: 38 y.o. MRN: [id number] Date of Office Visit: 01/02/2024  Assessment  Chief Complaint: Asthma and Follow-up (No issues)  HPI Mariah [name] is a 38 year old female who presents to the clinic for a follow-up visit.  She was last seen in this clinic on 06/11/2023 by Wanda Craze, FNP, for evaluation of asthma, allergic rhinitis, and specific antibody deficiency.  At today's visit, she reports her asthma has been well-controlled with no shortness of breath, cough, or wheezing with activity or rest.  She continues Trelegy 200-1 puff once a day and has not needed to use albuterol  since her last visit to this clinic.  She continues Tezspire  injections once a month with no large or local reactions.  She reports a significant decrease in her symptoms of asthma while continuing on Tezspire  injections.  Allergic rhinitis is reported as moderately well-controlled with occasional rhinorrhea as the main symptom.  She continues cetirizine  as needed and uses Flonase  occasionally.  She reports that she is not currently using any nasal saline rinses.  No recent environmental allergy testing is available for review.  Lab review from 07/30/2022 indicates 15 out of 23 protective pneumococcal titers and post pneumococcal vaccine injection pneumococcal titer protection decreased to 12 out of 23 strains.  She began taking azithromycin  250 mg 3 times a day at that time.  She reports this has been hard on her stomach and occasionally needs to take Zofran .  She does report that she has has not had any infections over the last 6 months and has not taken any azithromycin  during that timeframe.  She reports that she usually begins to experience infections over the winter or colder months and will begin azithromycin  3 times a week at that time.  She reports that she is not interested in  immunoglobulin replacement at this time.  Her current medications are listed in the chart.  Drug Allergies:  Allergies  Allergen Reactions   Omnicef  [Cefdinir ] Nausea And Vomiting   Codeine Nausea Only and Other (See Comments)    Passes out   Vicodin [Hydrocodone -Acetaminophen ] Nausea Only and Other (See Comments)    Passes out    Physical Exam: BP 108/74 (BP Location: Left Arm, Patient Position: Sitting, Cuff Size: Normal)   Pulse 84   Temp 98 F (36.7 C) (Temporal)   Ht 5' 2 (1.575 m)   Wt 127 lb 8 oz (57.8 kg)   SpO2 98%   BMI 23.32 kg/m    Physical Exam Vitals reviewed.  Constitutional:      Appearance: Normal appearance.  HENT:     Head: Normocephalic and atraumatic.     Right Ear: Tympanic membrane normal.     Left Ear: Tympanic membrane normal.     Nose:     Comments: Bilateral nares slightly erythematous with thin clear nasal drainage noted.  Pharynx normal.  Ears normal.  Eyes normal.    Mouth/Throat:     Pharynx: Oropharynx is clear.  Cardiovascular:     Rate and Rhythm: Normal rate and regular rhythm.     Heart sounds: Normal heart sounds. No murmur heard. Pulmonary:     Effort: Pulmonary effort is normal.     Breath sounds: Normal breath sounds.     Comments: Lungs clear to auscultation Musculoskeletal:        General: Normal range of motion.  Cervical back: Normal range of motion and neck supple.  Skin:    General: Skin is warm and dry.  Neurological:     Mental Status: She is alert and oriented to person, place, and time.  Psychiatric:        Mood and Affect: Mood normal.        Behavior: Behavior normal.        Thought Content: Thought content normal.        Judgment: Judgment normal.     Diagnostics: FVC 3.45 which is 102% of predicted value, FEV1 3.20 which is 114% of predicted value.  Spirometry indicates normal ventilatory function.  Assessment and Plan: 1. Severe persistent asthma without complication   2. Other allergic rhinitis    3. 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    Dispense:  30 tablet    Refill:  5    Patient Instructions  Asthma Continue montelukast  10 mg once a day to prevent cough or wheeze Continue Trelegy 200-1 puff once a day to prevent cough or wheeze Continue albuterol  2 puffs once every 4 hours if needed for cough or wheeze May use albuterol  2 puffs 5 to 15 minutes before activity to decrease cough or wheeze  Allergic rhinitis Continue an antihistamine once a day if needed for runny nose or itch Continue Flonase  2 sprays in each nostril once a day if needed for stuffy nose Consider saline nasal rinses as needed for nasal symptoms. Use this before any medicated nasal sprays for best result  Specific antibody deficiency When the weather gets cold or if you get an infection, restart azithromycin  250 mg 3 times a week Keep track of infections, antibiotics, and steroid use  Call the clinic if this treatment plan is not working well for you.  Follow up in 6 months or sooner if needed.   Return in about 6 months (around 07/04/2024), or if symptoms worsen or fail to improve.    Thank you for the opportunity to care for this patient.  Please do not hesitate to contact me with questions.  Arlean Mutter, FNP [facility] 

## 2024-01-02 NOTE — Telephone Encounter (Signed)
 Rx has been sent

## 2024-01-02 NOTE — Patient Instructions (Addendum)
 Asthma Continue montelukast  10 mg once a day to prevent cough or wheeze Continue Trelegy 200-1 puff once a day to prevent cough or wheeze Continue albuterol  2 puffs once every 4 hours if needed for cough or wheeze May use albuterol  2 puffs 5 to 15 minutes before activity to decrease cough or wheeze  Allergic rhinitis Continue an antihistamine once a day if needed for runny nose or itch Continue Flonase  2 sprays in each nostril once a day if needed for stuffy nose Consider saline nasal rinses as needed for nasal symptoms. Use this before any medicated nasal sprays for best result  Specific antibody deficiency When the weather gets cold or if you get an infection, restart azithromycin  250 mg 3 times a week Keep track of infections, antibiotics, and steroid use  Call the clinic if this treatment plan is not working well for you.  Follow up in 6 months or sooner if needed.

## 2024-01-06 ENCOUNTER — Telehealth: Payer: Self-pay | Admitting: *Deleted

## 2024-01-06 NOTE — Telephone Encounter (Signed)
 Called  patient advised approval and submit to [employer] for Tezspire  with new Ins

## 2024-04-29 ENCOUNTER — Other Ambulatory Visit: Payer: Self-pay | Admitting: *Deleted

## 2024-04-29 MED ORDER — ONDANSETRON HCL 4 MG PO TABS: 4.0000 mg | ORAL_TABLET | Freq: Three times a day (TID) | ORAL | 1 refills | Status: AC | PRN

## 2024-05-14 ENCOUNTER — Other Ambulatory Visit: Payer: Self-pay | Admitting: Allergy & Immunology

## 2024-07-06 ENCOUNTER — Ambulatory Visit: Payer: Self-pay | Admitting: Allergy & Immunology

## 2024-07-06 ENCOUNTER — Other Ambulatory Visit: Payer: Self-pay

## 2024-07-06 VITALS — BP 110/72 | HR 92 | Temp 98.1°F | Resp 18 | Ht 62.0 in | Wt 124.0 lb

## 2024-07-06 DIAGNOSIS — J4550 Severe persistent asthma, uncomplicated: Secondary | ICD-10-CM | POA: Diagnosis not present

## 2024-07-06 DIAGNOSIS — J3089 Other allergic rhinitis: Secondary | ICD-10-CM

## 2024-07-06 DIAGNOSIS — D806 Antibody deficiency with near-normal immunoglobulins or with hyperimmunoglobulinemia: Secondary | ICD-10-CM | POA: Diagnosis not present

## 2024-07-06 MED ORDER — TRELEGY ELLIPTA 100-62.5-25 MCG/ACT IN AEPB: 1.0000 | INHALATION_SPRAY | Freq: Every day | RESPIRATORY_TRACT | 5 refills | Status: AC

## 2024-07-06 NOTE — Progress Notes (Unsigned)
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   Subjective:   Mariah [name] is a 39 y.o. female presenting today for follow up of No chief complaint on file.   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 01/02/2024   Abnormal echocardiogram findings without diagnosis 11/06/2023   Moderate persistent asthma, uncomplicated 08/22/2022   Gastroesophageal reflux disease 07/24/2021   Normal labor 08/07/2020   Pruritus of vagina 07/01/2019   URI (upper respiratory infection) 01/28/2017   Missed period 01/28/2017   Encounter to establish care 05/04/2015   Motion sickness 05/04/2015   Chronic urticaria 02/25/2015   Rash and nonspecific skin eruption 09/21/2012   Left knee pain 08/02/2011   Contraception management 05/13/2011   PAP SMEAR, ABNORMAL 04/27/2009   SYNCOPE 02/08/2009   Other allergic rhinitis 12/21/2008   Recurrent infections 08/21/2007   Generalized anxiety disorder 03/11/2007   PANIC ATTACK 03/11/2007    History obtained from: chart review and {Persons; PED relatives w/patient:19415::patient}.  Discussed the use of AI scribe software for clinical note transcription with the patient and/or guardian, who gave verbal consent to proceed.  [name] is a 39 y.o. female presenting for {Blank  single:19197::a food challenge,a drug challenge,skin testing,a sick visit,an evaluation of ***,a follow up visit}.  She was actually seen in July 2025 by Arlean Mutter, one of our nurse practitioners.  At that time, we continue with montelukast  as well as Trelegy and albuterol .  For her allergic rhinitis, we continue with Flonase .  For her specific antibody deficiency, she was doing well, but it was recommended that she restart her azithromycin  3 times a week once the weather got chili or and the cold and flu season kicked out.  Asthma/Respiratory Symptom History: ***  Allergic Rhinitis Symptom History: ***  Food Allergy Symptom History: ***  Skin Symptom History: ***  GERD Symptom History: ***  Infection Symptom History: She started the azithromycin  around December 2025. She is going to continue through April or so. It has clearly helped with her symptoms  Otherwise, there have been no changes to her past medical history, surgical history, family history, or social history.    Review of systems otherwise negative other than that mentioned in the HPI.    Objective:   There were no vitals taken for this visit. There is no height or weight on file to calculate BMI.    Physical Exam   Diagnostic studies:    Spirometry: results normal (FEV1: 3.32/116%, FVC: 3.79/109%, FEV1/FVC: 88%).    Spirometry consistent with normal pattern.    Allergy Studies: none      Marty Shaggy, MD  [facility]        "

## 2024-07-06 NOTE — Patient Instructions (Addendum)
 1. Moderate persistent asthma, uncomplicated - Lung testing looks great today.  - I think we have a good handle on your symptoms - Let's decrease to the 100mcg Trelegy instead of the 200mcg.  - Daily controller medication(s): Trelegy 100mcg one puff once daily + Singulair  10mg  daily - Prior to physical activity: albuterol  2 puffs 10-15 minutes before physical activity. - Rescue medications: albuterol  4 puffs every 4-6 hours as needed - Asthma control goals:  * Full participation in all desired activities (may need albuterol  before activity) * Albuterol  use two time or less a week on average (not counting use with activity) * Cough interfering with sleep two time or less a month * Oral steroids no more than once a year * No hospitalizations  2. Allergic rhinitis - Continue with Allegra one tablet once daily as needed. - Continue with Flonase  one spray per nostril daily as needed.   3. Specific antibody deficiency - Continue with the azithromycin  250mg  three times weekly during the cold and flu season.    4. Return in about 6 months (around 01/03/2025). You can have the follow up appointment with Dr. Iva or a Nurse Practicioner (our Nurse Practitioners are excellent and always have Physician oversight!).    Please inform us  of any Emergency Department visits, hospitalizations, or changes in symptoms. Call us  before going to the ED for breathing or allergy symptoms since we might be able to fit you in for a sick visit. Feel free to contact us  anytime with any questions, problems, or concerns.  It was a pleasure to see you again today!  Websites that have reliable patient information: 1. American Academy of Asthma, Allergy, and Immunology: www.aaaai.org 2. Food Allergy Research and Education (FARE): foodallergy.org 3. Mothers of Asthmatics: http://www.asthmacommunitynetwork.org 4. American College of Allergy, Asthma, and Immunology: www.acaai.org      Like us  on [employer] and  Instagram for our latest updates!      A healthy democracy works best when [employer] participate! Make sure you are registered to vote! If you have moved or changed any of your contact information, you will need to get this updated before voting! Scan the QR codes below to learn more!

## 2024-07-08 ENCOUNTER — Encounter: Payer: Self-pay | Admitting: Allergy & Immunology

## 2024-07-12 ENCOUNTER — Other Ambulatory Visit: Payer: Self-pay | Admitting: Allergy & Immunology

## 2025-01-04 ENCOUNTER — Ambulatory Visit: Admitting: Allergy & Immunology
